# Patient Record
Sex: Female | Born: 1935 | Race: White | Hispanic: No | Marital: Married | Smoking: Former smoker
Health system: Southern US, Community
[De-identification: ages and names within clinical notes are randomized; demographics above are authoritative.]

## PROBLEM LIST (undated history)

## (undated) DIAGNOSIS — I10 Essential (primary) hypertension: Secondary | ICD-10-CM

## (undated) DIAGNOSIS — I509 Heart failure, unspecified: Secondary | ICD-10-CM

## (undated) DIAGNOSIS — J449 Chronic obstructive pulmonary disease, unspecified: Secondary | ICD-10-CM

## (undated) DIAGNOSIS — E119 Type 2 diabetes mellitus without complications: Secondary | ICD-10-CM

---

## 1998-12-03 ENCOUNTER — Ambulatory Visit (HOSPITAL_COMMUNITY): Admission: RE | Admit: 1998-12-03 | Discharge: 1998-12-03 | Payer: Self-pay | Admitting: Internal Medicine

## 1998-12-03 ENCOUNTER — Encounter: Payer: Self-pay | Admitting: Internal Medicine

## 1999-06-08 ENCOUNTER — Other Ambulatory Visit: Admission: RE | Admit: 1999-06-08 | Discharge: 1999-06-08 | Payer: Self-pay | Admitting: Internal Medicine

## 2001-06-12 ENCOUNTER — Other Ambulatory Visit: Admission: RE | Admit: 2001-06-12 | Discharge: 2001-06-12 | Payer: Self-pay | Admitting: Internal Medicine

## 2001-10-01 ENCOUNTER — Encounter: Payer: Self-pay | Admitting: *Deleted

## 2001-10-01 ENCOUNTER — Encounter: Admission: RE | Admit: 2001-10-01 | Discharge: 2001-10-01 | Payer: Self-pay | Admitting: *Deleted

## 2001-10-23 ENCOUNTER — Encounter: Payer: Self-pay | Admitting: Internal Medicine

## 2001-10-23 ENCOUNTER — Encounter: Admission: RE | Admit: 2001-10-23 | Discharge: 2001-10-23 | Payer: Self-pay | Admitting: Internal Medicine

## 2002-01-08 ENCOUNTER — Encounter: Payer: Self-pay | Admitting: Internal Medicine

## 2002-01-08 ENCOUNTER — Encounter: Admission: RE | Admit: 2002-01-08 | Discharge: 2002-01-08 | Payer: Self-pay | Admitting: Internal Medicine

## 2004-07-19 ENCOUNTER — Inpatient Hospital Stay (HOSPITAL_COMMUNITY): Admission: RE | Admit: 2004-07-19 | Discharge: 2004-07-22 | Payer: Self-pay | Admitting: Orthopedic Surgery

## 2004-12-06 ENCOUNTER — Inpatient Hospital Stay (HOSPITAL_COMMUNITY): Admission: RE | Admit: 2004-12-06 | Discharge: 2004-12-09 | Payer: Self-pay | Admitting: Orthopedic Surgery

## 2005-04-18 ENCOUNTER — Inpatient Hospital Stay (HOSPITAL_COMMUNITY): Admission: RE | Admit: 2005-04-18 | Discharge: 2005-04-21 | Payer: Self-pay | Admitting: Orthopedic Surgery

## 2005-11-01 ENCOUNTER — Encounter: Admission: RE | Admit: 2005-11-01 | Discharge: 2005-11-01 | Payer: Self-pay | Admitting: Internal Medicine

## 2009-02-05 ENCOUNTER — Ambulatory Visit: Admission: RE | Admit: 2009-02-05 | Discharge: 2009-02-05 | Payer: Self-pay | Admitting: Family Medicine

## 2011-01-07 NOTE — Discharge Summary (Signed)
[name], [name]                  ACCOUNT NO.:  [id number]   MEDICAL RECORD NO.:  [id number]          PATIENT TYPE:  INP   LOCATION:  1509                         FACILITY:  [facility]   PHYSICIAN:  Ollen Gross, M.D.    DATE OF BIRTH:  02/29/1926   DATE OF ADMISSION:  04/18/2005  DATE OF DISCHARGE:  04/21/2005                                 DISCHARGE SUMMARY   ADMITTING DIAGNOSES:  1.  Osteoarthritis, right knee.  2.  History of bronchitis.  3.  Hypertension.  4.  Mild stress urinary incontinence.  5.  Lumbar degenerative disk disease.   DISCHARGE DIAGNOSES:  1.  Osteoarthritis, right knee, status post right total knee arthroplasty.  2.  History of bronchitis.  3.  Hypertension.  4.  Mild stress urinary incontinence.  5.  Lumbar degenerative disk disease.   PROCEDURE:  The right total knee done on April 18, 2005, by Dr. Lequita Halt.  Assistant:  Avel Peace, PA-C.  Spinal anesthesia.  Target time:  46  minutes.   BRIEF HISTORY:  [name] is a 77 year old female with severe end-stage  arthritis of the right knee with intractable pain that failed nonoperative  management who now presents for total knee arthroplasty.   LABORATORY DATA:  Pre-op CBC:  Hemoglobin 13.6, hematocrit 40.0, normal  differential with the exception of elevated eos of 8.  Post-op hemoglobin  10.5.  Last H&H 10.0 and 28.3.  PT/PTT pre-op 13.2 and 42, respectively,  with INR 1.0.  Serial pro times were followed with the last PT/INR of 19.5  and 1.6.  Chem panel on admission all within normal limits.  Serial BMETs  were followed.  Glucose went up from 102 to 194 back down to 133.  Pre-op UA  negative.  Blood group type O+.   HOSPITAL COURSE:  The patient was admitted to [facility] and  tolerated the procedure well.  Later transferred to recovery then the  orthopedic floor.  Started on PCA and oral analgesics for pain control  following surgery.  By day 1, the patient was doing pretty well.  Did  have  some pain.  Hemovac drain was out.  Serum glucose was up likely due to the  dextrose in the fluids.  Fluids were reduced.  The patient was on a fluid  pill and already had been diuresing fluids real well so that she was not  overloaded by the morning of day 1.  She got up with physical therapy on day  1 and actually ambulated 60 feet that afternoon.  By day 2, she was doing  very well.  Serum glucose had already improved after removing the dextrose.  Dressing was changed on day 2.  Incision looked good.  Foley was  discontinued.  Lines/IVs were discontinued.  Discharge planning consulted.  The patient did well with physical therapy on day 2, did well enough she was  ambulating approximately 90-100 feet and did so well was ready to go home by  day 3.   DISCHARGE PLAN:  1.  The patient is discharged home on April 21, 2005.  2.  Discharge diagnoses:  Please see above.  3.  Discharge meds:  Percocet, Robaxin and Coumadin.  4.  Diet:  Resume previous home diet.  5.  Activity:  Weightbearing as tolerated.  Home health  PT.  Home RN and      total knee protocol.  May start showering.  6.  Follow up two weeks from surgery.  Call the office for an appointment at      [phone].   DISPOSITION:  Home.   CONDITION ON DISCHARGE:  Improved.      [name], P.A.      Ollen Gross, M.D.  Electronically Signed    ALP/MEDQ  D:  06/14/2005  T:  06/15/2005  Job:  [id number]   cc:   Darius Bump, M.D.  Fax: [phone]   Francisca December, M.D.  Fax: [phone]

## 2011-01-07 NOTE — Op Note (Signed)
[name], Powers                  ACCOUNT NO.:  [id number]   MEDICAL RECORD NO.:  [id number]          PATIENT TYPE:  INP   LOCATION:  0003                         FACILITY:  [facility]   PHYSICIAN:  Ollen Gross, M.D.    DATE OF BIRTH:  04-03-1926   DATE OF PROCEDURE:  12/06/2004  DATE OF DISCHARGE:                                 OPERATIVE REPORT   PREOPERATIVE DIAGNOSES:  Osteoarthritis right hip.   POSTOPERATIVE DIAGNOSES:  Osteoarthritis right hip.   PROCEDURE:  Right total hip arthroplasty.   SURGEON:  Ollen Gross, M.D.   ASSISTANT:  Alexzandrew L. Julien Girt, P.A.   ANESTHESIA:  Spinal.   ESTIMATED BLOOD LOSS:  400.   DRAINS:  Hemovac x1.   COMPLICATIONS:  None.   CONDITION:  Stable to recovery.   BRIEF CLINICAL NOTE:  [name] is a 77 year old female who has end-stage  arthritis of the right hip with intractable pain.  She has failed  nonoperative management and presents now for total hip arthroplasty.   DESCRIPTION OF PROCEDURE:  After successful administration of spinal  anesthetic, the patient was placed in the left lateral decubitus position  with the right side up and held with a hip positioner. The right lower  extremity was isolated from her perineum with plastic drapes and prepped and  draped in the usual sterile fashion. A standard posterolateral incision was  made with a 10 blade through the subcutaneous tissue to the level of the  fascia lata which was incised in line with the skin incision. The sciatic  nerve was palpated and protected and short external rotators isolated off  the femur. Capsulectomy is performed and the hip is dislocated. The center  of the femoral head is marked and a trial prosthesis placed such that the  center of the trial head corresponds to the center of the native femoral  head. Osteotomy line is marked on the femoral neck and osteotomy made with  an oscillating saw. The femoral head is removed. The femur is retracted  anteriorly to gain acetabular exposure.   The acetabular reaming starts at 45 coursing in increments of 2 to 51 mm and  then a 52 mm pinnacle acetabular shell is placed in anatomic position and  transfixed with two dome screws. A trial 28 mm neutral liner is placed.   The femur is repaired first with the canal finder and then irrigation. Axial  reaming is performed with to 13.5 mm, proximal reaming to a D and the sleeve  machined to a large. An 18D large trial sleeve is placed with an 18 x 13  stem and a 36 plus 8 neck. We matched her native anteversion. A 28 plus 0  head is placed. With the 28 plus 0, it was just an easy reduction so we went  to a 28 plus 4 neutral liner and this had much more appropriate soft tissue  tension. She had full extension, full external rotation, 70 degrees of  flexion, 40 degrees adduction, 90 degrees internal rotation and 90 degrees  flexion, 70 degrees internal rotation. The hip is  then dislocated and all  trials are removed. The permanent apex hole eliminator is placed into the  acetabular shell and then the permanent 28 mm neutral plus 4 marathon liner  is placed. The 18D large sleeve is then placed into the femur with an 18 x  13 stem and a 36 plus 8 neck matching her native anteversion. A 28 plus 0  head is placed and the hip is reduced with the same stability parameters.  The wound is copiously irrigated with saline solution and the short external  rotators reattached to the femur through drill holes. The fascia lata was  closed over a hemovac drain with interrupted #1 Vicryl, subcu closed in  multiple layers with #1 and 2-0 Vicryl and then subcuticular closed with a  running 4-0 Monocryl. The incision is cleaned and dried and Steri-Strips and  a bulky sterile dressing applied. Drains hooked to suction. She is then  placed into a knee immobilizer, awakened and transported to recovery in  stable condition.      FA/MEDQ  D:  12/06/2004  T:   12/06/2004  Job:  [id number]

## 2011-01-07 NOTE — H&P (Signed)
NAMEBarabara, [name]                  ACCOUNT NO.:  [id number]   MEDICAL RECORD NO.:  [id number]          PATIENT TYPE:  INP   LOCATION:  NA                           FACILITY:  [facility]   PHYSICIAN:  Ollen Gross, M.D.    DATE OF BIRTH:  [birth year]   DATE OF ADMISSION:  07/19/2004  DATE OF DISCHARGE:                                HISTORY & PHYSICAL   DATE OF OFFICE VISIT AND HISTORY AND PHYSICAL:  July 13, 2004   CHIEF COMPLAINT:  Bilateral knee pain, left greater than right.   HISTORY OF PRESENT ILLNESS:  A 77 year old female, seen by Dr. Lequita Halt for  ongoing bilateral knee pain.  She is known to have severe end-stage  arthritis.  She has been treated conservatively in the past with medications  and also injections including Synvisc.  She has been refractory to  conservative measurements including injections.  She is seen in the office.  Her x-rays show end-stage arthritis of both knees.  The left knee is  slightly more advanced than the right.  It is felt she would benefit from  undergoing knee replacement.  Risks and benefits have been discussed with  the patient, and she elects to proceed with surgery.   ALLERGIES:  1.  TETRACYCLINE causes hives.  2.  ACE INHIBITORS.   INTOLERANCES:  DEMEROL causes nausea.   CURRENT MEDICATIONS:  1.  Diovan 320 mg.  2.  Diltiazem 320 mg.  3.  Atenolol 25 mg  4.  Hydrochlorothiazide 50 mg.   PAST MEDICAL HISTORY:  1.  Past history of bronchitis.  2.  Hypertension.  3.  Mild stress urinary incontinence  4.  Degenerative disk disease.   PAST SURGICAL HISTORY:  1.  Recent cardiac catheterization during cardiac work-up 2 weeks ago.  2.  Tonsillectomy in 1937.  3.  Partial hysterectomy secondary to fibroids in 1969.  4.  Breast biopsy which was negative in 1987.  5.  Bilateral oophorectomy in 1991.  6.  Cholecystectomy in 1992.  7.  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   SOCIAL HISTORY:  Married, retired Runner, broadcasting/film/video,  nonsmoker, no alcohol.  Has three  children.  Husband will be assisting with care after surgery.   FAMILY HISTORY:  Father deceased age 57 with a history of stroke and  hypertension.  Mother deceased age 48 with a history of ovarian cancer.  She  also had a brother who had leukemia and is deceased.   REVIEW OF SYSTEMS:  GENERAL:  No fevers, chills, or night sweats.  NEUROLOGIC:  No seizures, syncope, paralysis.  RESPIRATORY:  She did have a  recent upper respiratory infection with cough.  No fevers, however.  This  has resolved.  CARDIOVASCULAR:  No chest pain, angina, or orthopnea.  GI:  No nausea, vomiting, diarrhea, or constipation.  GU:  No dysuria, hematuria,  or discharge.  She only has a little bit of mild stress urinary  incontinence.  MUSCULOSKELETAL:  Pertinent to the knees found in the history  of present illness.   PHYSICAL EXAMINATION:  VITAL SIGNS:  Pulse  52, respirations 12, blood  pressure 138/82.  GENERAL:  A 77 year old white female, well-nourished, well-developed, in no  acute distress.  She is alert, oriented, and cooperative, very pleasant  HEENT:  Normocephalic, atraumatic.  Pupils are round and reactive.  Oropharynx clear.  EOMs are intact.  She is noted to wear glasses.  NECK:  Supple.  CHEST:  Clear anterior and posterior chest walls.  No rhonchi, rales, or  wheezing.  HEART:  Does have a bradycardic rhythm with a pulse of 52, regular rhythm.  S1, S2 noted.  No rubs, thrills, palpitations are appreciated.  ABDOMEN:  Soft, slightly round.  Bowel sounds are present.  RECTAL/BREASTS/GENITALIA:  Not done.  Not pertinent to present illness.  EXTREMITIES:  Left knee shows range of motion of 5-110 degrees.  Tender to  palpation.  No effusion, no instability.  Right knee shows same range of  motion of 5-110 degrees, only slightly tender.  No effusion, no instability.   IMPRESSION:  1.  Osteoarthritis, bilateral knees, left more symptomatic than right.  2.  History  of bronchitis.  3.  Hypertension.  4.  Mild stress urinary incontinence.  5.  Degenerative disk disease.   PLAN:  The patient will be admitted to [facility] to undergo a  left total knee arthroplasty.  Surgery will be performed by Dr. Ollen Gross.  The patient's medical doctor is Dr. Madison Hickman.  Her  cardiologist is Dr. Amil Amen.  They will be notified of the room number on  admission and be consulted if needed for any medical assistance with the  patient throughout the hospital course.     [name]   ALP/MEDQ  D:  07/18/2004  T:  07/18/2004  Job:  [id number]   cc:   Darius Bump, M.D.  [address]  [place] [address]  Fax: [phone]   Francisca December, M.D.  [address]  [address]  [place]  [place] [address]  Fax: [phone]

## 2011-01-07 NOTE — H&P (Signed)
[name], [name]                  ACCOUNT NO.:  [id number]   MEDICAL RECORD NO.:  [id number]          PATIENT TYPE:  INP   LOCATION:  0003                         FACILITY:  [facility]   PHYSICIAN:  Ollen Gross, M.D.    DATE OF BIRTH:  June 28, 1926   DATE OF ADMISSION:  04/18/2005  DATE OF DISCHARGE:                                HISTORY & PHYSICAL   DATE OF OFFICE VISIT HISTORY AND PHYSICAL:  April 14, 2005   DATE OF ADMISSION:  April 18, 2005   CHIEF COMPLAINT:  Right knee pain.   HISTORY OF PRESENT ILLNESS:  The patient is a 77 year old female well known  to Dr. Ollen Gross, had previously undergone a right total knee  arthroplasty. She is doing very well with regards to her right hip. However,  she has continued right knee pain. She is known to have end-stage arthritis  of the right knee. She elected to undergo the right hip previously. She has  recovered well from that. She has had continued pain with the right knee  where she is found to have end-stage arthritis. It is felt she would benefit  from undergoing a total knee arthroplasty. Due to the fact that she has done  extremely well with the right hip it is felt she would be a good candidate  for right total knee. She has been seen preoperatively by Dr. Madison Hickman  and states that she is stable and does not feel that she requires any  further medical workup. Therefore, she is admitted to the hospital for  upcoming surgery.   ALLERGIES:  1.  TETRACYCLINE causes hives.  2.  DEMEROL causes nausea.  3.  ACE INHIBITORS.   CURRENT MEDICATIONS:  1.  Diltiazem 360 mg.  2.  Diovan 320 mg.  3.  Hydrochlorothiazide 50 mg.  4.  Toprol-XL 25 mg.  5.  Glucosamine/chondroitin.  6.  Centrum Silver multivitamin.  7.  Calcium and vitamin D.   PAST MEDICAL HISTORY:  1.  Hypertension.  2.  Mild urinary stress incontinence.  3.  Osteoarthritis.  4.  Lumbar degenerative disc disease.  5.  History of bronchitis.   PAST  SURGICAL HISTORY:  1.  Right total knee arthroplasty April 2006.  2.  Left total knee arthroplasty 2005.  3.  Recent cardiac catheterization.  4.  Tonsillectomy in 1937.  5.  Partial hysterectomy secondary to fibroids in 1969.  6.  Breast biopsy negative in 1987.  7.  Bilateral oophorectomy in 1991.  8.  Cholecystectomy in 1992.  9.  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 Husband and daughter will be assisting with care after surgery.   FAMILY HISTORY:  Father deceased age 34 with a history of stroke and  hypertension. Mother deceased age 45 with history of ovarian cancer. Has a  brother who had leukemia.   REVIEW OF SYSTEMS:  GENERAL:  No fevers, chills, night sweats. NEUROLOGIC:  No seizure, syncope, paralysis. RESPIRATORY:  No shortness of breath,  productive  cough, or hemoptysis. CARDIOVASCULAR:  No chest pain, angina,  orthopnea. GI:  No nausea, vomiting, diarrhea, constipation. GU:  No  dysuria, hematuria, discharge. MUSCULOSKELETAL:  Right knee found in the  history of present illness.   PHYSICAL EXAMINATION:  VITAL SIGNS:  Pulse 60, respirations 12, blood  pressure 155/77.  GENERAL:  A 77 year old female well-nourished, well-developed, no acute  distress, slightly overweight. She is alert, oriented, cooperative, pleasant  at the time of exam.  HEENT:  Normocephalic, atraumatic. Pupils round and reactive. EOMs intact.  CHEST:  Clear anterior and posterior chest walls. No rhonchi, rales, or  wheezing.  HEART:  Regular rhythm with a grade 2/6 systolic ejection murmur noted. No  rubs, rales, palpitations.  ABDOMEN:  Soft, round, slightly protuberant abdomen. Bowel sounds are  present.  RECTAL, BREAST, GENITALIA:  Not done, not pertinent to present illness.  EXTREMITIES:  Right knee:  She is moderately tender to palpation on the  anterior knees, especially over the  medial and lateral joint line. There is  moderate crepitus noted on passive range of motion. There is no effusion.   IMPRESSION:  1.  Osteoarthritis right knee.  2.  History of bronchitis.  3.  Hypertension.  4.  Mild stress urinary incontinence.  5.  Lumbar degenerative disc disease.   PLAN:  The patient will be admitted to [facility] and undergo  right total knee arthroplasty. Surgery will be performed by Dr. Ollen Gross.      Alexzandrew L. Julien Girt, P.A.      Ollen Gross, M.D.  Electronically Signed    ALP/MEDQ  D:  04/17/2005  T:  04/18/2005  Job:  [id number]   cc:   Darius Bump, M.D.  [address]  [place] [address]  Fax: [phone]   Francisca December, M.D.  [address]  [address]  [place]  [place] [address]  Fax: [phone]

## 2011-01-07 NOTE — Discharge Summary (Signed)
NAMEGRACEANNE, GUIN                  ACCOUNT NO.:  [id number]   MEDICAL RECORD NO.:  [id number]          PATIENT TYPE:  INP   LOCATION:  0471                         FACILITY:  [facility]   PHYSICIAN:  Ollen Gross, M.D.    DATE OF BIRTH:  04/11/1926   DATE OF ADMISSION:  12/06/2004  DATE OF DISCHARGE:  12/09/2004                                 DISCHARGE SUMMARY   ADMISSION DIAGNOSES:  1.  Osteoarthritis, right hip.  2.  History of bronchitis.  3.  Hypertension.  4.  Mild stress urinary incontinence.  5.  Degenerative disk disease.   DISCHARGE DIAGNOSES:  1.  Osteoarthritis, right hip, status post right total knee arthroplasty.  2.  History of bronchitis.  3.  Hypertension.  4.  Mild stress urinary incontinence.  5.  Degenerative disk disease.  6.  Postoperative hyponatremia, improved.   PROCEDURE:  December 06, 2004, right total hip. Surgeon Ollen Gross, M.D.  Assistant Alexzandrew L. Perkins, P.A.-C. Spinal anesthesia. Blood loss 400  cc.   BRIEF HISTORY:  Ms. Mcnicholas is a 77 year old female with end-stage arthritis  of the right hip with intractable pain. Failed nonoperative management. Now  presents for total hip.   LABORATORY DATA:  Preoperative CBC:  Hemoglobin 13.7, hematocrit 40.7,  differential within normal limits with the exception of mildly elevated  eosinophils of 7, normal white count, postoperative hemoglobin down to 10.8.  Last noted hemoglobin has stabilized at 10.8 with a hematocrit of 31.4.  PT/PTT preoperatively 12.0 and 41 respectively and INR of 0.9. Serial  protimes followed. Last known PT/INR were 14.5 and 1.2. Chem panel on  admission all within normal limits. Sodium did drop postoperatively 137 to  132, back up to 136. Glucose 119 to 198, back to 142. Remainder BMET was  within normal limits. Urinalysis:  Trace leukocyte esterase, few  epithelials, 2 white cells, otherwise negative. Blood group type O positive.  Right hip films on November 29, 2004:   Atypical presentation of degenerative  OA, right hip, with narrowing of the more central aspect of the joint.  Pelvis taken on December 06, 2004:  Satisfactory appearance of right total hip,  portable right hip films, same satisfactory appearance of right total hip on  December 06, 2004.   HOSPITAL COURSE:  Admitted to [facility]. Underwent the above  procedure without complication. Tolerated well. Was later transferred to the  floor. Placed on PCA and p.o. analgesia for pain control following surgery.  Did well through that night. By day #1, Hemovac drain placed at time of  surgery was pulled. She had a little hyperglycemia felt to be due to the D5  and the fluids. That was changed. A little bit of mild hyponatremia felt to  be dilutional. She was on fluid pill. Her home medications were restarted.  By day #2, her sodium had come back up to 136. Glucose had come back down to  142. She was doing better, had less pain, started getting up with physical  therapy, ambulating approximately 80 feet and 140 feet with therapy, was  progressing well.  IVs were discontinued. By day #3, she was feeling well,  seemed by Dr. Despina Hick, and was discharged home.   DISCHARGE AND PLAN:  1.  Discharged home on December 09, 2004.  2.  Discharge diagnoses:  Please see above.  3.  Discharge medications:Home meds plus Percocet, Robaxin, Coumadin  4.  Diet:  Resume previous home diet. Regular low sodium diet.  5.  Activity:  Partial weightbearing 50% RLE.   Followup two weeks from surgery. Call the office for an appointment, [phone]  [phone].   DISPOSITION:  To home.   CONDITION ON DISCHARGE:  Improved.      ALP/MEDQ  D:  01/21/2005  T:  01/21/2005  Job:  [id number]   cc:   Darius Bump, M.D.  [address]  [place] [address]  Fax: [phone]   Francisca December, M.D.  [address]  [address]  [place]  [place] [address]  Fax: [phone]

## 2011-01-07 NOTE — Discharge Summary (Signed)
[name], [name]                  ACCOUNT NO.:  [id number]   MEDICAL RECORD NO.:  [id number]          PATIENT TYPE:  INP   LOCATION:  0464                         FACILITY:  [facility]   PHYSICIAN:  Ollen Gross, M.D.    DATE OF BIRTH:  1926/01/18   DATE OF ADMISSION:  07/19/2004  DATE OF DISCHARGE:  07/22/2004                                 DISCHARGE SUMMARY   ADMISSION DIAGNOSES:  1.  Osteoarthritis of bilateral knees, left more symptomatic than right.  2.  History of bronchitis.  3.  Hypertension.  4.  Mild stress urinary incontinence.  5.  Degenerative disk disease.   DISCHARGE DIAGNOSES:  1.  Osteoarthritis of left knee, status post left total knee arthroplasty.  2.  Postoperative hyponatremia, improving.  3.  History of bronchitis.  4.  Hypertension.  5.  Mild stress urinary incontinence.  6.  Degenerative disk disease.   PROCEDURES:  On July 19, 2004, left total knee arthroplasty.  Surgeon:  Ollen Gross, M.D.  Assistant:  Alexzandrew L. Julien Girt, P.A.  Anesthesia:  Spinal.  Minimal blood loss.  Hemovac drain x 1.  Tourniquet time 49 minutes  at 300 mmHg.   CONSULTS:  None.   BRIEF HISTORY:  Ms. [name] is a 77 year old female with end-stage arthritis  of both knees.  The left is more symptomatic.  She has failed nonoperative  management, including injections, and now presents for total knee  arthroplasty.   LABORATORY DATA:  Preoperative CBC done on an outpatient basis showed a  hemoglobin of 14.3, a hematocrit of 41.0 and differential within normal  limits.  H&H postoperatively 11.6 and 34.0.  Last H&H 10.7 and 31.6.  PT and  PTT preoperatively 12.8 and 45, respectively.  INR 1.0.  Serial pro times  were followed.  The last known PT and INR was 14.5 and 1.2, respectively.  Chemistry panel on admission within normal limits with the exception of a  low sodium of 132 and a slightly elevated AST of 43.  Serial BMETs were  followed.  The sodium did drop down to 131,  then down again to 129 and back  up to 124.  The potassium went down from 3.6 to 3.4.  Glucose went up from  104 to 177 and back down to 131.  The urinalysis was cloudy, otherwise  negative.  Blood type O positive.   Two-view chest dated July 09, 2004, showed no evidence of acute  cardiopulmonary disease.  There is a previous EKG on the chart dated  June 29, 2004, with a heart rate of 69 in sinus rhythm, unconfirmed, no  signature.   HOSPITAL COURSE:  The patient was admitted to [facility], taken to  the OR and underwent the above-stated procedure without complication.  The  patient tolerated the procedure well and later was sent to the recovery room  and then to the orthopedic floor for continued postoperative care.  The  patient was placed on PCA and p.o. analgesic for pain control following  surgery.  The Hemovac drain placed at the time of surgery was pulled.  On  day #1, the patient did have a drop in her sodium felt to be due to fluid  dilution.  We reduced the fluid rate and resumed her home medications.  She  did get up out of bed with physical therapy to a chair.  By day #2, she was  doing a little bit better.  She had been using the CPM for assistive motion.  Her sodium dropped a little bit better further down to 129.  Fluids were  discontinued.  The dressing was changed.  The incision was healing well.  She had already been up ambulating approximately 20 feet in the morning with  therapy and then 120 feet later that day.  She is progressing well.  By day  #3, she had a little bit of elevated temperature.  On the evening of day #2,  a urinalysis was checked which was okay.  Her sodium had come back up.  She  was given some oral potassium for the low potassium.  Her temperature had  come back down once she was up ambulating.  Her fever improved.  Her sodium  had improved.  She had been placed on potassium supplement.  She was doing  well with therapy.  It was  decided to be discharged home on July 22, 2004.   DISCHARGE PLAN:  The patient was discharged home on July 22, 2004.   DISCHARGE MEDICATIONS:  Coumadin, Percocet and Robaxin.   DIET:  As tolerated.  Resume previous home diet.   ACTIVITY:  Total knee protocol.  Weightbearing as tolerated.  Home health PT  and home health nursing.   FOLLOWUP:  Follow up two weeks from surgery.   DISPOSITION:  Home.   CONDITION UPON DISCHARGE:  Improved.     [name]   ALP/MEDQ  D:  08/26/2004  T:  08/26/2004  Job:  [id number]   cc:   Darius Bump, M.D.  [place]  [place] [address]  Fax: [phone]   Francisca December, M.D.  [address]  [address]  [place]  [place] [address]  Fax: [phone]

## 2011-01-07 NOTE — Op Note (Signed)
[name], [name]                  ACCOUNT NO.:  [id number]   MEDICAL RECORD NO.:  [id number]          PATIENT TYPE:  INP   LOCATION:  0002                         FACILITY:  [facility]   PHYSICIAN:  Ollen Gross, M.D.    DATE OF BIRTH:  1926-01-30   DATE OF PROCEDURE:  07/19/2004  DATE OF DISCHARGE:                                 OPERATIVE REPORT   PREOPERATIVE DIAGNOSIS:  Osteoarthritis left knee.   POSTOPERATIVE DIAGNOSIS:  Osteoarthritis left knee.   PROCEDURE:  Left total knee arthroplasty.   SURGEON:  Ollen Gross, M.D.   ASSISTANT:  Alexzandrew L. Julien Girt, P.A.   ANESTHESIA:  Spinal.   ESTIMATED BLOOD LOSS:  Minimal.   DRAIN:  Hemovac x 1.   TOURNIQUET TIME:  49 minutes at 300 mmHg.   COMPLICATIONS:  None.   CONDITION:  Stable to recovery.   BRIEF CLINICAL NOTE:  [name] is a 77 year old female with end-stage  arthritis of both knees with intractable pain.  Left knee is more  symptomatic than the right.  She has failed nonoperative management  including injections and presents now for total knee arthroplasty.   PROCEDURE IN DETAIL:  After successful administration of spinal anesthetic,  a tourniquet was placed high on the left thigh and left lower extremity  prepped and draped in the usual sterile fashion.  Extremity was wrapped in  Esmarch, knee flexed, and tourniquet inflated to 300 mmHg.  A standard  midline incision was made with a 10 blade through subcutaneous tissue to the  level of the extensor mechanism.  A fresh blade was used to make a medial  parapatellar arthrotomy, then the soft tissue over the proximal medial tibia  subperiosteally elevated to the joint line with a knife and into the  semimembranosus bursa with a Cobb elevator.  The soft tissue over the  proximal lateral tibia was also elevated with attention being paid to avoid  the patellar tendon on tibial tubercle.  The patella was everted and the  knee flexed to 90 degrees, and ACL and PCL  removed.  She had  tricompartmental degenerative change bone-on-bone throughout.  A drill was  used to create a starting hole in the distal femur, and the canal was  irrigated.  A 5-degree left valgus alignment guide was placed and  referencing of the posterior condyle's rotation was marked and the block  pins removed 10 mm up the distal femur.  Distal femoral resection was made  with an oscillating saw.  Sizing blocks placed, and size 3 was most  appropriate.  The rotation was marked off the epicondylar axis.  The cutting  block for a size 3 was placed, and the anterior, posterior, and chamfer cuts  were made.   Tibia was subluxed forward, and the menisci removed.  Extramedullary tibial  alignment guide was placed referencing proximally at the medial aspect of  the tibial tubercle and distally along the second metatarsal axis and tibial  crest.  A block was pinned to remove 10 mm of the nondeficient lateral side.  Tibial resection was made with an oscillating  saw.  The sizing 3 was the  most appropriate tibial component, and then the proximal tibia was prepared  with the modular drill and keel punch for a size 3.  Femoral preparation was  completed with the intercondylar cut.   The size 3 posterior stabilized femoral trial, size 3 mobile bearing tibial  trial, and a 10 mm posterior stabilized rotating platform insert trial were  placed.  With the 10, full extension was achieved, with excellent varus and  valgus balance throughout full range of motion.  The patella was then  everted, and thickness measured to be 22 mm.  Freehand resection was taken  to 13 mm, 38 template was placed, lug holes were drilled, trial patella was  placed, and it tracked normally.  The osteophytes were then removed  off the  posterior femur with the trial in place.  All trials were removed, and the  cut bone surface was prepared with pulsatile lavaged.  Cement was mixed and,  once ready for implantation, the  size 3 posterior stabilized femur, size 3  mobile bearing tibial tray, and 38 patella were cemented into place.  The  patella was held with a clamp.  The 10 mm trial insert was placed, and the  knee held in full extension, and all extruded cement removed.  Once the  cement was full hardened, then the permanent 10 mm posterior stabilized  rotating platform insert was placed in the tibial tray.  The wound was  copiously irrigated with saline solution, and the extensor mechanism closed  over a Hemovac drain with interrupted #1 PDS.  Flexion against gravity to  125 degrees.  Tourniquet was then released for a total tourniquet time of 36  minutes.  The tourniquet was then released for a total tourniquet time of 49  minutes.  Subcutaneous was closed with interrupted 2-0 Vicryl, subcuticular  running 4-0 Monocryl.  The incision was cleaned and dried, and Steri-Strips  and a bulky sterile dressing applied.  She was then awakened and transported  to recovery in stable condition.     [name]   FA/MEDQ  D:  07/19/2004  T:  07/19/2004  Job:  [id number]

## 2011-01-07 NOTE — H&P (Signed)
[name], [name]                  ACCOUNT NO.:  [id number]   MEDICAL RECORD NO.:  [id number]          PATIENT TYPE:  INP   LOCATION:  0471                         FACILITY:  [facility]   PHYSICIAN:  Ollen Gross, M.D.    DATE OF BIRTH:  July 03, 1926   DATE OF ADMISSION:  12/06/2004  DATE OF DISCHARGE:                                HISTORY & PHYSICAL   CHIEF COMPLAINT:  Right hip pain.   HISTORY OF PRESENT ILLNESS:  The patient is a 77 year old female well known  to Dr. Ollen Gross having previously undergone a left total knee  replacement and has done quite well with her knee. She has known history of  right hip arthritis and the hip has become progressively worse, interfering  with her activities with what she can and cannot do. X-rays show end-stage  arthritis. It is felt that she could benefit from undergoing surgical  intervention. Risks and benefits have been discussed with the patient. She  elected to proceed with surgery.   ALLERGIES:  TETRACYCLINE causes hives and ACE INHIBITORS. Intolerance to  DEMEROL which causes nausea.   CURRENT MEDICATIONS:  1.  Diovan 320 mg.  2.  Diltiazem 360 mg.  3.  Toprol XL 25 mg.  4.  Hydrochlorothiazide 50 mg.   PAST MEDICAL HISTORY:  1.  Past history of bronchitis.  2.  Hypertension.  3.  Mild stress urinary incontinence.  4.  Degenerative disc disease.   PAST SURGICAL HISTORY:  1.  Left total knee arthroplasty in 2005.  2.  Recent cardiac catheterization.  3.  Tonsillectomy in 1937.  4.  Partial hysterectomy secondary to fibroids in 1969.  5.  Breast biopsy which was negative in 1987.  6.  Bilateral oophorectomy in 1991.  7.  Cholecystectomy in 1992.  8.  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   SOCIAL HISTORY:  Married. Retired Runner, broadcasting/film/video. Nonsmoker, no alcohol. Has three  children. Husband will be assisting with care after surgery.   FAMILY HISTORY:  Father is deceased at age 39 with a history of stroke and  hypertension. Mother is deceased at age 47 with a history of ovarian cancer.  She has a brother who had leukemia.   REVIEW OF SYMPTOMS:  GENERAL:  No fevers, chills, or night sweats.  NEUROLOGICAL:  No seizures, syncope, or paralysis. RESPIRATORY:  No  shortness of breath, productive cough, or hemoptysis. CARDIOVASCULAR:  No  chest pain, angina, or orthopnea. GI:  No nausea, vomiting, diarrhea, or  constipation. GU:  No dysuria, hematuria, or discharge. MUSCULOSKELETAL:  Right hip found in the history of present illness.   PHYSICAL EXAMINATION:  VITAL SIGNS:  Pulse 52, respirations 12, blood  pressure 140/72.  GENERAL:  A 77 year old female well-developed, well-nourished in no acute  distress. She is alert, oriented, and cooperative. Pleasant at the time of  my exam.  HEENT:  Normocephalic and atraumatic.  Pupils are equal, round, and  reactive. EOMs are intact. She does wear glasses.  NECK:  Supple.  LUNGS:  She has clear anterior and posterior chest walls. No rhonchi, rubs,  or wheezing.  HEART:  Bradycardic rhythm with a pulse of 52. Regular rhythm otherwise. S1  and S2 noted. No rubs, thrills, or palpitations.  ABDOMEN:  Soft, slightly round, bowel sounds present. No rebound or  guarding.  RECTAL:  Not done, not pertinent to present illness.  BREASTS:  Not done, not pertinent to present illness.  GENITOURINARY:  Not done, not pertinent to present illness.  EXTREMITIES:  Right lower extremity:  She does note to have some pain with  passive range of motion with hip flexion and internal and external rotation  was limited secondary to pain. She does ambulate with an antalgic gait.   IMPRESSION:  1.  Osteoarthritis right hip.  2.  History of bronchitis.  3.  Hypertension.  4.  Mild stress urinary incontinence.  5.  Degenerative disc disease.   PLAN:  The patient is admitted to [facility] to undergo right  total hip arthroplasty. Surgery will be performed by Dr. Ollen Gross.      ALP/MEDQ  D:  12/06/2004  T:  12/06/2004  Job:  [id number]   cc:   Ollen Gross, M.D.  [facility]  [address]  [address]  [place]  [place] [address]  Fax: [phone]   Darius Bump, M.D.  [phone] [address]  [place] [address]  Fax: [phone]   Francisca December, M.D.  [address]  [address]  [place]  [place] [address]  Fax: [phone]

## 2011-01-07 NOTE — Op Note (Signed)
[name], [name]                  ACCOUNT NO.:  [id number]   MEDICAL RECORD NO.:  [id number]          PATIENT TYPE:  INP   LOCATION:  0003                         FACILITY:  [facility]   PHYSICIAN:  Ollen Gross, M.D.    DATE OF BIRTH:  11-02-1925   DATE OF PROCEDURE:  04/18/2005  DATE OF DISCHARGE:                                 OPERATIVE REPORT   PREOPERATIVE DIAGNOSIS:  Osteoarthritis, right knee.   POSTOPERATIVE DIAGNOSIS:  Osteoarthritis, right knee.   PROCEDURE:  Right total knee arthroplasty.   SURGEON:  Dr. Lequita Halt   ASSISTANT:  Avel Peace, PA-C   ANESTHESIA:  Spinal.   ESTIMATED BLOOD LOSS:  Minimal.   DRAINS:  Hemovac x1.   TOURNIQUET TIME:  46 minutes at 300 mmHg.   COMPLICATIONS:  None.   CONDITION:  Stable to recovery.   BRIEF CLINICAL NOTE:  [name] is a 77 year old female with severe end-  stage arthritis of the right knee with intractable pain.  She has failed  nonoperative management and presents now for right total knee arthroplasty.   PROCEDURE IN DETAIL:  After the successful administration of spinal  anesthetic, a tourniquet is placed on the right thigh and right lower  extremity prepped and draped in the usual sterile fashion.  Extremity is  wrapped in Esmarch, knee flexed, tourniquet inflated to 300 mmHg.  Standard  midline incision is made with a 10 blade through subcutaneous tissue to the  level of the extensor mechanism.  A fresh blade is used to make a medial  parapatellar arthrotomy, and then the soft tissue over the proximal and  medial tibia is subperiosteally elevated to the joint line with a knife and  into the semimembranosus bursa with a Cobb elevator.  Soft tissue over the  proximal and lateral tibia is also elevated with attention being paid to  avoiding the patellar tendon on tibial tubercle.  Patella is everted, knee  flexed 90 degrees, and ACL and PCL removed.  Drill is used to create a  starting hole in the distal femur, and  the canal is thoroughly irrigated.  A  5-degree right valgus alignment guide is placed and referencing off the  posterior condyles, rotation is marked and the block pinned to remove 10 mm  off the distal femur.  Distal femoral resection is made with an oscillating  saw.  Sizing block is placed, and a size 3 is most appropriate.  A size 3  cutting block is placed, and then the anterior, posterior, and chamfer cuts  are made for the three.   The tibia is subluxed forward and the menisci are removed.  Extramedullary  tibial alignment guide is placed referencing proximally at the medial aspect  of the tibial tubercle and distally along the second metatarsal axis and  tibial crest.  The block is pinned to remove 10 mm off the nondeficient  lateral side.  Tibial resection is made with an oscillating saw.  Size 3 is  the most appropriate tibial component, and then the proximal tibia is  prepared with the modular drill and keel  punch for a size 3.  Femoral  preparation is completed with the intercondylar cut for the size 3.   Size 3 mobile bearing tibial trial with a size 3 posterior stabilized  femoral trial and a 10 mm posterior stabilized rotating platform insert  trial are placed.  With the 10, there is a tiny bit of laxity in flexion and  extension, thus we went to a 12.5 which allowed for full extension with  excellent stability throughout full range of motion.  The patella was then  everted, thickness measured to be 23 mm.  Freehand resection is taken to 14  mm, 38 template is placed, lug holes are drilled, trial patella is placed,  and it tracks normally.  The osteophytes are then removed off the posterior  femur with the trial in place.  All trials are removed, and the cut bone  surfaces are prepared with pulsatile lavage.  Cement is mixed and once ready  for implantation, the size 3 mobile bearing tibial tray, size 3 posterior  stabilized femur, and 38 patella are cemented into place,  and the patella is  held with the clamp.  Trial 12.5 insert is placed, knee held in full  extension, and all extruded cement removed.  Once the cement is fully  hardened, then the permanent 12.5 mm posterior stabilized rotating platform  insert is placed into the tibial tray.  Wound is copiously irrigated with  saline solution and the extensor mechanism closed over a Hemovac drain with  interrupted #1 PDS.  Flexion against gravity is approximately 125 degrees.  Tourniquet is released for a total time of 46 minutes.  Minor bleeding  stopped with cautery.  Subcu is closed with interrupted 2-0 Vicryl and  subcuticular running 4-0 Monocryl.  The incision is cleaned and dried and  Steri-Strips applied.  Drain is hooked to suction and a bulky sterile  dressing applied.  She is then placed into a knee immobilizer, awakened, and  transported to recovery in stable condition.      Ollen Gross, M.D.  Electronically Signed     FA/MEDQ  D:  04/18/2005  T:  04/18/2005  Job:  [id number]

## 2018-10-27 ENCOUNTER — Encounter (HOSPITAL_COMMUNITY): Payer: Self-pay

## 2018-10-27 ENCOUNTER — Other Ambulatory Visit: Payer: Self-pay

## 2018-10-27 ENCOUNTER — Emergency Department (HOSPITAL_COMMUNITY): Payer: Medicare Other

## 2018-10-27 ENCOUNTER — Inpatient Hospital Stay (HOSPITAL_COMMUNITY)
Admission: EM | Admit: 2018-10-27 | Discharge: 2018-11-01 | DRG: 190 | Disposition: A | Discharge status: Home / Self Care | Payer: Medicare Other | Attending: Internal Medicine | Admitting: Internal Medicine

## 2018-10-27 DIAGNOSIS — Z881 Allergy status to other antibiotic agents status: Secondary | ICD-10-CM

## 2018-10-27 DIAGNOSIS — I110 Hypertensive heart disease with heart failure: Secondary | ICD-10-CM | POA: Diagnosis present

## 2018-10-27 DIAGNOSIS — I272 Pulmonary hypertension, unspecified: Secondary | ICD-10-CM | POA: Diagnosis present

## 2018-10-27 DIAGNOSIS — J84112 Idiopathic pulmonary fibrosis: Secondary | ICD-10-CM | POA: Diagnosis present

## 2018-10-27 DIAGNOSIS — J9601 Acute respiratory failure with hypoxia: Secondary | ICD-10-CM | POA: Diagnosis present

## 2018-10-27 DIAGNOSIS — I5030 Unspecified diastolic (congestive) heart failure: Secondary | ICD-10-CM | POA: Diagnosis not present

## 2018-10-27 DIAGNOSIS — R0902 Hypoxemia: Secondary | ICD-10-CM

## 2018-10-27 DIAGNOSIS — J441 Chronic obstructive pulmonary disease with (acute) exacerbation: Principal | ICD-10-CM | POA: Diagnosis present

## 2018-10-27 DIAGNOSIS — R0602 Shortness of breath: Secondary | ICD-10-CM

## 2018-10-27 DIAGNOSIS — I10 Essential (primary) hypertension: Secondary | ICD-10-CM | POA: Diagnosis present

## 2018-10-27 DIAGNOSIS — I5033 Acute on chronic diastolic (congestive) heart failure: Secondary | ICD-10-CM | POA: Diagnosis present

## 2018-10-27 DIAGNOSIS — Z888 Allergy status to other drugs, medicaments and biological substances status: Secondary | ICD-10-CM

## 2018-10-27 DIAGNOSIS — E876 Hypokalemia: Secondary | ICD-10-CM | POA: Diagnosis present

## 2018-10-27 DIAGNOSIS — J9600 Acute respiratory failure, unspecified whether with hypoxia or hypercapnia: Secondary | ICD-10-CM | POA: Diagnosis present

## 2018-10-27 DIAGNOSIS — Z66 Do not resuscitate: Secondary | ICD-10-CM | POA: Diagnosis present

## 2018-10-27 DIAGNOSIS — Z79899 Other long term (current) drug therapy: Secondary | ICD-10-CM | POA: Diagnosis not present

## 2018-10-27 DIAGNOSIS — Z885 Allergy status to narcotic agent status: Secondary | ICD-10-CM

## 2018-10-27 HISTORY — DX: Chronic obstructive pulmonary disease, unspecified: J44.9

## 2018-10-27 HISTORY — DX: Essential (primary) hypertension: I10

## 2018-10-27 LAB — CBC WITH DIFFERENTIAL/PLATELET
Abs Immature Granulocytes: 0.03 10*3/uL (ref 0.00–0.07)
BASOS ABS: 0 10*3/uL (ref 0.0–0.1)
BASOS PCT: 1 %
EOS ABS: 0 10*3/uL (ref 0.0–0.5)
EOS PCT: 1 %
HCT: 46.9 % — ABNORMAL HIGH (ref 36.0–46.0)
Hemoglobin: 16 g/dL — ABNORMAL HIGH (ref 12.0–15.0)
Immature Granulocytes: 0 %
LYMPHS PCT: 12 %
Lymphs Abs: 0.8 10*3/uL (ref 0.7–4.0)
MCH: 31.9 pg (ref 26.0–34.0)
MCHC: 34.1 g/dL (ref 30.0–36.0)
MCV: 93.6 fL (ref 80.0–100.0)
Monocytes Absolute: 0.6 10*3/uL (ref 0.1–1.0)
Monocytes Relative: 9 %
NRBC: 0 % (ref 0.0–0.2)
Neutro Abs: 5.3 10*3/uL (ref 1.7–7.7)
Neutrophils Relative %: 77 %
PLATELETS: 177 10*3/uL (ref 150–400)
RBC: 5.01 MIL/uL (ref 3.87–5.11)
RDW: 13.2 % (ref 11.5–15.5)
WBC: 6.8 10*3/uL (ref 4.0–10.5)

## 2018-10-27 LAB — BASIC METABOLIC PANEL
Anion gap: 16 — ABNORMAL HIGH (ref 5–15)
BUN: 16 mg/dL (ref 8–23)
CO2: 22 mmol/L (ref 22–32)
Calcium: 9.5 mg/dL (ref 8.9–10.3)
Chloride: 96 mmol/L — ABNORMAL LOW (ref 98–111)
Creatinine, Ser: 0.85 mg/dL (ref 0.44–1.00)
GFR calc Af Amer: >60 mL/min (ref >60)
GLUCOSE: 159 mg/dL — AB (ref 70–99)
Potassium: 3.1 mmol/L — ABNORMAL LOW (ref 3.5–5.1)
Sodium: 134 mmol/L — ABNORMAL LOW (ref 135–145)

## 2018-10-27 LAB — CBC
HCT: 44.7 % (ref 36.0–46.0)
Hemoglobin: 15.6 g/dL — ABNORMAL HIGH (ref 12.0–15.0)
MCH: 32.1 pg (ref 26.0–34.0)
MCHC: 34.9 g/dL (ref 30.0–36.0)
MCV: 92 fL (ref 80.0–100.0)
Platelets: 184 10*3/uL (ref 150–400)
RBC: 4.86 MIL/uL (ref 3.87–5.11)
RDW: 13.1 % (ref 11.5–15.5)
WBC: 8.2 10*3/uL (ref 4.0–10.5)
nRBC: 0 % (ref 0.0–0.2)

## 2018-10-27 LAB — TROPONIN I: Troponin I: <0.03 ng/mL (ref <0.03)

## 2018-10-27 LAB — CREATININE, SERUM
CREATININE: 0.82 mg/dL (ref 0.44–1.00)
GFR calc Af Amer: >60 mL/min (ref >60)
GFR calc non Af Amer: >60 mL/min (ref >60)

## 2018-10-27 LAB — MAGNESIUM: Magnesium: 1.6 mg/dL — ABNORMAL LOW (ref 1.7–2.4)

## 2018-10-27 LAB — BRAIN NATRIURETIC PEPTIDE: B NATRIURETIC PEPTIDE 5: 211.9 pg/mL — AB (ref 0.0–100.0)

## 2018-10-27 MED ORDER — IRBESARTAN 300 MG PO TABS
300.0000 mg | ORAL_TABLET | Freq: Every day | ORAL | Status: DC
  Administered 2018-10-27: 300 mg via ORAL
  Filled 2018-10-27 (×2): qty 1

## 2018-10-27 MED ORDER — TRAZODONE HCL 50 MG PO TABS: 50.0000 mg | ORAL_TABLET | Freq: Every evening | ORAL | Status: DC | PRN

## 2018-10-27 MED ORDER — POTASSIUM CHLORIDE CRYS ER 20 MEQ PO TBCR
20.0000 meq | EXTENDED_RELEASE_TABLET | Freq: Two times a day (BID) | ORAL | Status: AC
  Administered 2018-10-27 – 2018-10-28 (×2): 20 meq via ORAL
  Filled 2018-10-27 (×2): qty 1

## 2018-10-27 MED ORDER — BENZONATATE 100 MG PO CAPS
200.0000 mg | ORAL_CAPSULE | Freq: Three times a day (TID) | ORAL | Status: DC | PRN
  Administered 2018-10-27 – 2018-10-30 (×3): 200 mg via ORAL
  Filled 2018-10-27 (×3): qty 2

## 2018-10-27 MED ORDER — DILTIAZEM HCL ER COATED BEADS 360 MG PO TB24
360.0000 mg | ORAL_TABLET | Freq: Every day | ORAL | Status: DC
  Administered 2018-10-27: 360 mg via ORAL
  Filled 2018-10-27 (×2): qty 1

## 2018-10-27 MED ORDER — IPRATROPIUM-ALBUTEROL 0.5-2.5 (3) MG/3ML IN SOLN
3.0000 mL | Freq: Once | RESPIRATORY_TRACT | Status: AC
  Administered 2018-10-27: 3 mL via RESPIRATORY_TRACT
  Filled 2018-10-27: qty 3

## 2018-10-27 MED ORDER — ACETAMINOPHEN 325 MG PO TABS: 650.0000 mg | ORAL_TABLET | Freq: Four times a day (QID) | ORAL | Status: DC | PRN

## 2018-10-27 MED ORDER — MAGNESIUM GLUCONATE 500 MG PO TABS
500.0000 mg | ORAL_TABLET | Freq: Once | ORAL | Status: AC
  Administered 2018-10-27: 500 mg via ORAL
  Filled 2018-10-27: qty 1

## 2018-10-27 MED ORDER — ALBUTEROL SULFATE (2.5 MG/3ML) 0.083% IN NEBU: 2.5000 mg | INHALATION_SOLUTION | RESPIRATORY_TRACT | Status: DC | PRN

## 2018-10-27 MED ORDER — SODIUM CHLORIDE 0.9 % IV SOLN
1.0000 g | INTRAVENOUS | Status: DC
  Administered 2018-10-28 – 2018-10-30 (×4): 1 g via INTRAVENOUS
  Filled 2018-10-27 (×5): qty 10

## 2018-10-27 MED ORDER — SENNOSIDES-DOCUSATE SODIUM 8.6-50 MG PO TABS: 1.0000 | ORAL_TABLET | Freq: Every evening | ORAL | Status: DC | PRN

## 2018-10-27 MED ORDER — METOPROLOL TARTRATE 25 MG PO TABS
12.5000 mg | ORAL_TABLET | Freq: Every day | ORAL | Status: DC
  Administered 2018-10-27 – 2018-11-01 (×5): 12.5 mg via ORAL
  Filled 2018-10-27 (×6): qty 1

## 2018-10-27 MED ORDER — ENOXAPARIN SODIUM 40 MG/0.4ML ~~LOC~~ SOLN
40.0000 mg | SUBCUTANEOUS | Status: DC
  Administered 2018-10-27 – 2018-10-31 (×5): 40 mg via SUBCUTANEOUS
  Filled 2018-10-27 (×5): qty 0.4

## 2018-10-27 MED ORDER — METHYLPREDNISOLONE SODIUM SUCC 125 MG IJ SOLR
60.0000 mg | Freq: Once | INTRAMUSCULAR | Status: AC
  Administered 2018-10-27: 60 mg via INTRAVENOUS
  Filled 2018-10-27: qty 2

## 2018-10-27 MED ORDER — ONDANSETRON HCL 4 MG/2ML IJ SOLN: 4.0000 mg | Freq: Four times a day (QID) | INTRAMUSCULAR | Status: DC | PRN

## 2018-10-27 MED ORDER — HYDROCHLOROTHIAZIDE 25 MG PO TABS
25.0000 mg | ORAL_TABLET | Freq: Every day | ORAL | Status: DC
  Filled 2018-10-27: qty 1

## 2018-10-27 MED ORDER — PREDNISONE 20 MG PO TABS
40.0000 mg | ORAL_TABLET | Freq: Every day | ORAL | Status: DC
  Administered 2018-10-28: 40 mg via ORAL
  Filled 2018-10-27: qty 2

## 2018-10-27 MED ORDER — IPRATROPIUM-ALBUTEROL 0.5-2.5 (3) MG/3ML IN SOLN
3.0000 mL | RESPIRATORY_TRACT | Status: DC
  Administered 2018-10-27 – 2018-10-29 (×8): 3 mL via RESPIRATORY_TRACT
  Filled 2018-10-27 (×6): qty 3

## 2018-10-27 MED ORDER — ONDANSETRON HCL 4 MG PO TABS: 4.0000 mg | ORAL_TABLET | Freq: Four times a day (QID) | ORAL | Status: DC | PRN

## 2018-10-27 MED ORDER — ACETAMINOPHEN 650 MG RE SUPP: 650.0000 mg | Freq: Four times a day (QID) | RECTAL | Status: DC | PRN

## 2018-10-27 NOTE — ED Provider Notes (Signed)
Emergency Department Provider Note   I have reviewed the triage vital signs and the nursing notes.   HISTORY  Chief Complaint Shortness of Breath   HPI [name] is a 85 y.o. female with PMH of COPD and HTN presents to the emergency department for evaluation of shortness of breath.  The patient has COPD that is managed primarily with exercise.  She has been on inhalers in the past but got no relief from these so, in conjunction with her pulmonologist, discontinued these medications.  She has had some vague, weakness 2 weeks ago which improved.  Shortness of breath symptoms began this morning while doing her typical activities at home.  She denies any chest pain or heart palpitations.  No lightheadedness.  No new medications.  She called to alert her daughter that she was not feeling well and EMS was called.  They report oxygen saturation of 78% on room air and started 2 L nasal cannula and gave 1 breathing treatment in route to the emergency department.  Patient denies any body aches, international travel, or known sick contacts.  Past Medical History:  Diagnosis Date  . COPD (chronic obstructive pulmonary disease) (HCC)   . Hypertension     Patient Active Problem List   Diagnosis Date Noted  . COPD exacerbation (HCC) 10/27/2018  . Essential hypertension 10/27/2018  . Acute respiratory failure (HCC) 10/27/2018  . Hypomagnesemia 10/27/2018  . Hypokalemia 10/27/2018   Allergies Bisoprolol-hydrochlorothiazide; Meperidine; and Tetracycline  No family history on file.  Social History Social History   Tobacco Use  . Smoking status: Not on file  Substance Use Topics  . Alcohol use: Not on file  . Drug use: Not on file    Review of Systems  Constitutional: No fever/chills Eyes: No visual changes. ENT: No sore throat. Cardiovascular: Denies chest pain. Respiratory: Positive shortness of breath. Gastrointestinal: No abdominal pain.  No nausea, no vomiting.  No  diarrhea.  No constipation. Genitourinary: Negative for dysuria. Musculoskeletal: Negative for back pain. Skin: Negative for rash. Neurological: Negative for headaches, focal weakness or numbness.  10-point ROS otherwise negative.  ____________________________________________   PHYSICAL EXAM:  VITAL SIGNS: ED Triage Vitals  Enc Vitals Group     BP 10/27/18 1259 (!) 176/78     Pulse --      Resp 10/27/18 1259 16     Temp 10/27/18 1259 97.8 F (36.6 C)     Temp Source 10/27/18 1259 Oral     SpO2 10/27/18 1253 (!) 78 %     Pain Score 10/27/18 1256 0   Constitutional: Alert and oriented. Well appearing and in no acute distress. Eyes: Conjunctivae are normal.  Head: Atraumatic. Nose: No congestion/rhinnorhea. Mouth/Throat: Mucous membranes are moist. Neck: No stridor.  Cardiovascular: Normal rate, regular rhythm. Good peripheral circulation. Grossly normal heart sounds.   Respiratory: Normal respiratory effort.  No retractions. Lungs with end-expiratory wheezing bilaterally.  Gastrointestinal: Soft and nontender. No distention.  Musculoskeletal: No lower extremity tenderness nor edema. No gross deformities of extremities. Neurologic:  Normal speech and language. No gross focal neurologic deficits are appreciated.  Skin:  Skin is warm, dry and intact. No rash noted.  ____________________________________________   LABS (all labs ordered are listed, but only abnormal results are displayed)  Labs Reviewed  BASIC METABOLIC PANEL - Abnormal; Notable for the following components:      Result Value   Sodium 134 (*)    Potassium 3.1 (*)    Chloride 96 (*)  Glucose, Bld 159 (*)    Anion gap 16 (*)    All other components within normal limits  BRAIN NATRIURETIC PEPTIDE - Abnormal; Notable for the following components:   B Natriuretic Peptide 211.9 (*)    All other components within normal limits  CBC WITH DIFFERENTIAL/PLATELET - Abnormal; Notable for the following components:    Hemoglobin 16.0 (*)    HCT 46.9 (*)    All other components within normal limits  MAGNESIUM - Abnormal; Notable for the following components:   Magnesium 1.6 (*)    All other components within normal limits  CBC - Abnormal; Notable for the following components:   Hemoglobin 15.6 (*)    All other components within normal limits  TROPONIN I  INFLUENZA PANEL BY PCR (TYPE A & B)  CREATININE, SERUM  BASIC METABOLIC PANEL  CBC  MAGNESIUM   ____________________________________________  EKG   EKG Interpretation  Date/Time:  Saturday October 27 2018 12:57:35 EST Ventricular Rate:  93 PR Interval:    QRS Duration: 91 QT Interval:  301 QTC Calculation: 303 R Axis:   96 Text Interpretation:  Sinus rhythm Ventricular bigeminy Short PR interval Right axis deviation Anteroseptal infarct, old Borderline repolarization abnormality No STEMI.  Confirmed by Alona Bene [phone]) on 10/27/2018 1:04:32 PM       ____________________________________________  RADIOLOGY  Dg Chest 2 View  Result Date: 10/27/2018 CLINICAL DATA:  Dyspnea EXAM: CHEST - 2 VIEW COMPARISON:  07/09/2004 chest radiograph. FINDINGS: Stable cardiomediastinal silhouette with mild cardiomegaly. No pneumothorax. No pleural effusion. No overt pulmonary edema. Hazy reticular opacities at both lung bases. Hyperinflated lungs. IMPRESSION: 1. Hyperinflated lungs, suggesting COPD. 2. Hazy reticular opacities at both lung bases worrisome for interstitial lung disease. Consider further evaluation with high-resolution chest CT, which may be performed on a short term outpatient basis as clinically warranted. 3. Mild cardiomegaly without overt pulmonary edema. Electronically Signed   By: Delbert Phenix M.D.   On: 10/27/2018 14:53   Ct Chest High Resolution  Result Date: 10/27/2018 CLINICAL DATA:  Dyspnea. COPD. Reticular opacities at the lung bases on chest radiograph. EXAM: CT CHEST WITHOUT CONTRAST TECHNIQUE: Multidetector CT imaging of the  chest was performed following the standard protocol without intravenous contrast. High resolution imaging of the lungs, as well as inspiratory and expiratory imaging, was performed. COMPARISON:  Chest radiograph from earlier today. FINDINGS: Limited motion degraded scan. Cardiovascular: Mild cardiomegaly. No significant pericardial effusion/thickening. Three-vessel coronary atherosclerosis. Atherosclerotic nonaneurysmal thoracic aorta. Borderline prominent main pulmonary artery (3.4 cm diameter). Mediastinum/Nodes: No discrete thyroid nodules. Unremarkable esophagus. No axillary adenopathy. Right paratracheal adenopathy up to 1.7 cm (series 5/image 53). Enlarged 1.3 cm subcarinal node (series 5/image 67). Enlarged 1.1 cm AP window node (series 5/image 49). No discrete hilar adenopathy on this noncontrast scan. Lungs/Pleura: No pneumothorax. No pleural effusion. Moderate to severe centrilobular emphysema with diffuse bronchial wall thickening. There is extensive patchy confluent subpleural reticulation and ground-glass attenuation in both lungs with associated subpleural lines and mild traction bronchiectasis and mild architectural distortion. There is a strong basilar predominance to these findings. No frank honeycombing. No significant lobular air trapping on the expiration sequence. No lung masses or significant pulmonary nodules. Upper abdomen: Cholecystectomy. Simple 3.8 cm anterior interpolar left renal cyst. Musculoskeletal: No aggressive appearing focal osseous lesions. Marked thoracic spondylosis. IMPRESSION: 1. Very limited motion degraded scan. 2. 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 Given the limitations of this scan, suggest attention on a  follow-up high-resolution chest CT study in 3 months. Findings are categorized as probable UIP per consensus guidelines: Diagnosis of Idiopathic Pulmonary Fibrosis: An Official  ATS/ERS/JRS/ALAT Clinical Practice Guideline. Am Rosezetta Schlatter Crit Care Med Vol 198, Iss 5, [phone], Apr 22 2017. 3. Three-vessel coronary atherosclerosis. 4. Mild cardiomegaly. 5. Nonspecific mild bilateral mediastinal adenopathy, potentially reactive, which can also be reassessed on short-term follow-up chest CT. Aortic Atherosclerosis (ICD10-I70.0) and Emphysema (ICD10-J43.9). Electronically Signed   By: Delbert Phenix M.D.   On: 10/27/2018 16:08    ____________________________________________   PROCEDURES  Procedure(s) performed:   Procedures  CRITICAL CARE Performed by: Maia Plan Total critical care time: 35 minutes Critical care time was exclusive of separately billable procedures and treating other patients. Critical care was necessary to treat or prevent imminent or life-threatening deterioration. Critical care was time spent personally by me on the following activities: development of treatment plan with patient and/or surrogate as well as nursing, discussions with consultants, evaluation of patient's response to treatment, examination of patient, obtaining history from patient or surrogate, ordering and performing treatments and interventions, ordering and review of laboratory studies, ordering and review of radiographic studies, pulse oximetry and re-evaluation of patient's condition.  Alona Bene, MD Emergency Medicine  ____________________________________________   INITIAL IMPRESSION / ASSESSMENT AND PLAN / ED COURSE  Pertinent labs & imaging results that were available during my care of the patient were reviewed by me and considered in my medical decision making (see chart for details).  Patient presents to the emergency department with shortness of breath starting this morning.  She has elevated blood pressure with EMS and 78% O2 on room air.  Patient comfortable on 2 L nasal cannula.  She has bilateral wheezing on exam.  Plan for additional nebulizer, chest x-ray, labs.   Lower suspicion for PE without chest pain.  Doubt hypertensive emergency as blood pressure on arrival to the emergency department is elevated but not to a severe level.  No chest pain.   CXR with basilar process. Doubt PNA. Improved with nebs. Continues on 3L [place]. Labs unremarkable.   Discussed patient's case with Hospitalist to request admission. Patient and family (if present) updated with plan. Care transferred to Hospitalist service.  I reviewed all nursing notes, vitals, pertinent old records, EKGs, labs, imaging (as available).  ____________________________________________  FINAL CLINICAL IMPRESSION(S) / ED DIAGNOSES  Final diagnoses:  COPD exacerbation (HCC)  Hypoxia     MEDICATIONS GIVEN DURING THIS VISIT:  Medications  enoxaparin (LOVENOX) injection 40 mg (has no administration in time range)  acetaminophen (TYLENOL) tablet 650 mg (has no administration in time range)    Or  acetaminophen (TYLENOL) suppository 650 mg (has no administration in time range)  traZODone (DESYREL) tablet 50 mg (has no administration in time range)  senna-docusate (Senokot-S) tablet 1 tablet (has no administration in time range)  ondansetron (ZOFRAN) tablet 4 mg (has no administration in time range)    Or  ondansetron (ZOFRAN) injection 4 mg (has no administration in time range)  cefTRIAXone (ROCEPHIN) 1 g in sodium chloride 0.9 % 100 mL IVPB (has no administration in time range)  predniSONE (DELTASONE) tablet 40 mg (has no administration in time range)  ipratropium-albuterol (DUONEB) 0.5-2.5 (3) MG/3ML nebulizer solution 3 mL (3 mLs Nebulization Not Given 10/27/18 1809)  albuterol (PROVENTIL) (2.5 MG/3ML) 0.083% nebulizer solution 2.5 mg (has no administration in time range)  diltiazem (CARDIZEM LA) 24 hr tablet 360 mg (has no administration in time range)  hydrochlorothiazide (HYDRODIURIL) tablet  25 mg (25 mg Oral Given 10/27/18 1911)  metoprolol tartrate (LOPRESSOR) tablet 12.5 mg (12.5 mg Oral  Given 10/27/18 1912)  irbesartan (AVAPRO) tablet 300 mg (300 mg Oral Given 10/27/18 1910)  potassium chloride SA (K-DUR,KLOR-CON) CR tablet 20 mEq (has no administration in time range)  ipratropium-albuterol (DUONEB) 0.5-2.5 (3) MG/3ML nebulizer solution 3 mL (3 mLs Nebulization Given 10/27/18 1342)  methylPREDNISolone sodium succinate (SOLU-MEDROL) 125 mg/2 mL injection 60 mg (60 mg Intravenous Given 10/27/18 1857)  magnesium gluconate (MAGONATE) tablet 500 mg (500 mg Oral Given 10/27/18 1910)    Note:  This document was prepared using Dragon voice recognition software and may include unintentional dictation errors.  Alona Bene, MD Emergency Medicine    Kelton Bultman, Arlyss Repress, MD 10/27/18 [phone]

## 2018-10-27 NOTE — ED Notes (Signed)
ED TO INPATIENT HANDOFF REPORT  ED Nurse Name and Phone #: Purnell Shoemaker [phone]  S Name/Age/Gender [name] 85 y.o. female Room/Bed: 047C/047C  Code Status   Code Status: DNR  Home/SNF/Other Home Patient oriented to: self, place, time and situation Is this baseline? Yes   Triage Complete: Triage complete  Chief Complaint sob  Triage Note No notes on file   Allergies Allergies  Allergen Reactions  . Bisoprolol-Hydrochlorothiazide Other (See Comments)    Other reaction(s): Cough (ALLERGY/intolerance)  . Meperidine Nausea And Vomiting  . Tetracycline Hives    Level of Care/Admitting Diagnosis ED Disposition    ED Disposition Condition Comment   Admit  Hospital Area: [facility] [[id number]]  Level of Care: Med-Surg [16]  Diagnosis: COPD exacerbation [facility]) [[id number]]  Admitting Physician: Sharlene Dory [phone]  Attending Physician: Sharlene Dory [[id number]]  Estimated length of stay: past midnight tomorrow  Certification:: I certify this patient will need inpatient services for at least 2 midnights  PT Class (Do Not Modify): Inpatient [101]  PT Acc Code (Do Not Modify): Private [1]       B Medical/Surgery History Past Medical History:  Diagnosis Date  . COPD (chronic obstructive pulmonary disease) (HCC)   . Hypertension       A IV Location/Drains/Wounds Patient Lines/Drains/Airways Status   Active Line/Drains/Airways    Name:   Placement date:   Placement time:   Site:   Days:   Peripheral IV 10/27/18 Left Antecubital   10/27/18    -    Antecubital   less than 1          Intake/Output Last 24 hours No intake or output data in the 24 hours ending 10/27/18 1701  Labs/Imaging Results for orders placed or performed during the hospital encounter of 10/27/18 (from the past 48 hour(s))  Basic metabolic panel     Status: Abnormal   Collection Time: 10/27/18  1:30 PM  Result Value Ref Range   Sodium 134 (L) 135 - 145 mmol/L    Potassium 3.1 (L) 3.5 - 5.1 mmol/L   Chloride 96 (L) 98 - 111 mmol/L   CO2 22 22 - 32 mmol/L   Glucose, Bld 159 (H) 70 - 99 mg/dL   BUN 16 8 - 23 mg/dL   Creatinine, Ser 5.63 0.44 - 1.00 mg/dL   Calcium 9.5 8.9 - 87.5 mg/dL   GFR calc non Af Amer >60 >60 mL/min   GFR calc Af Amer >60 >60 mL/min   Anion gap 16 (H) 5 - 15    Comment: Performed at [facility], [address], [place], [place] [address]  Brain natriuretic peptide     Status: Abnormal   Collection Time: 10/27/18  1:30 PM  Result Value Ref Range   B Natriuretic Peptide 211.9 (H) 0.0 - 100.0 pg/mL    Comment: Performed at [facility], [address], [place], [place] [address]  Troponin I - Once     Status: None   Collection Time: 10/27/18  1:30 PM  Result Value Ref Range   Troponin I <0.03 <0.03 ng/mL    Comment: Performed at [facility], [address], [place], [place] [address]  CBC with Differential     Status: Abnormal   Collection Time: 10/27/18  1:30 PM  Result Value Ref Range   WBC 6.8 4.0 - 10.5 K/uL   RBC 5.01 3.87 - 5.11 MIL/uL   Hemoglobin 16.0 (H) 12.0 -  15.0 g/dL   HCT 16.1 (H) 09.6 - 04.5 %   MCV 93.6 80.0 - 100.0 fL   MCH 31.9 26.0 - 34.0 pg   MCHC 34.1 30.0 - 36.0 g/dL   RDW 40.9 81.1 - 91.4 %   Platelets 177 150 - 400 K/uL   nRBC 0.0 0.0 - 0.2 %   Neutrophils Relative % 77 %   Neutro Abs 5.3 1.7 - 7.7 K/uL   Lymphocytes Relative 12 %   Lymphs Abs 0.8 0.7 - 4.0 K/uL   Monocytes Relative 9 %   Monocytes Absolute 0.6 0.1 - 1.0 K/uL   Eosinophils Relative 1 %   Eosinophils Absolute 0.0 0.0 - 0.5 K/uL   Basophils Relative 1 %   Basophils Absolute 0.0 0.0 - 0.1 K/uL   Immature Granulocytes 0 %   Abs Immature Granulocytes 0.03 0.00 - 0.07 K/uL    Comment: Performed at [facility], [address] [place], [place] [address]  Magnesium     Status: Abnormal   Collection Time: 10/27/18  1:30 PM  Result Value Ref Range   Magnesium 1.6 (L) 1.7 - 2.4 mg/dL    Comment:  Performed at [facility], [address], [place], [place] [address]   Dg Chest 2 View  Result Date: 10/27/2018 CLINICAL DATA:  Dyspnea EXAM: CHEST - 2 VIEW COMPARISON:  07/09/2004 chest radiograph. FINDINGS: Stable cardiomediastinal silhouette with mild cardiomegaly. No pneumothorax. No pleural effusion. No overt pulmonary edema. Hazy reticular opacities at both lung bases. Hyperinflated lungs. IMPRESSION: 1. Hyperinflated lungs, suggesting COPD. 2. Hazy reticular opacities at both lung bases worrisome for interstitial lung disease. Consider further evaluation with high-resolution chest CT, which may be performed on a short term outpatient basis as clinically warranted. 3. Mild cardiomegaly without overt pulmonary edema. Electronically Signed   By: Delbert Phenix M.D.   On: 10/27/2018 14:53   Ct Chest High Resolution  Result Date: 10/27/2018 CLINICAL DATA:  Dyspnea. COPD. Reticular opacities at the lung bases on chest radiograph. EXAM: CT CHEST WITHOUT CONTRAST TECHNIQUE: Multidetector CT imaging of the chest was performed following the standard protocol without intravenous contrast. High resolution imaging of the lungs, as well as inspiratory and expiratory imaging, was performed. COMPARISON:  Chest radiograph from earlier today. FINDINGS: Limited motion degraded scan. Cardiovascular: Mild cardiomegaly. No significant pericardial effusion/thickening. Three-vessel coronary atherosclerosis. Atherosclerotic nonaneurysmal thoracic aorta. Borderline prominent main pulmonary artery (3.4 cm diameter). Mediastinum/Nodes: No discrete thyroid nodules. Unremarkable esophagus. No axillary adenopathy. Right paratracheal adenopathy up to 1.7 cm (series 5/image 53). Enlarged 1.3 cm subcarinal node (series 5/image 67). Enlarged 1.1 cm AP window node (series 5/image 49). No discrete hilar adenopathy on this noncontrast scan. Lungs/Pleura: No pneumothorax. No pleural effusion. Moderate to severe centrilobular emphysema  with diffuse bronchial wall thickening. There is extensive patchy confluent subpleural reticulation and ground-glass attenuation in both lungs with associated subpleural lines and mild traction bronchiectasis and mild architectural distortion. There is a strong basilar predominance to these findings. No frank honeycombing. No significant lobular air trapping on the expiration sequence. No lung masses or significant pulmonary nodules. Upper abdomen: Cholecystectomy. Simple 3.8 cm anterior interpolar left renal cyst. Musculoskeletal: No aggressive appearing focal osseous lesions. Marked thoracic spondylosis. IMPRESSION: 1. Very limited motion degraded scan. 2. 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 Given the limitations of this scan, suggest attention on a follow-up high-resolution chest CT study in 3 months. Findings are categorized as probable  UIP per consensus guidelines: Diagnosis of Idiopathic Pulmonary Fibrosis: An Official ATS/ERS/JRS/ALAT Clinical Practice Guideline. Am Rosezetta Schlatter Crit Care Med Vol 198, Iss 5, [phone], Apr 22 2017. 3. Three-vessel coronary atherosclerosis. 4. Mild cardiomegaly. 5. Nonspecific mild bilateral mediastinal adenopathy, potentially reactive, which can also be reassessed on short-term follow-up chest CT. Aortic Atherosclerosis (ICD10-I70.0) and Emphysema (ICD10-J43.9). Electronically Signed   By: Delbert Phenix M.D.   On: 10/27/2018 16:08    Pending Labs Unresulted Labs (From admission, onward)    Start     Ordered   11/03/18 0500  Creatinine, serum  (enoxaparin (LOVENOX)    CrCl >/= 30 ml/min)  Weekly,   R    Comments:  while on enoxaparin therapy    10/27/18 1625   10/28/18 0500  Basic metabolic panel  Tomorrow morning,   R     10/27/18 1625   10/28/18 0500  CBC  Tomorrow morning,   R     10/27/18 1625   10/28/18 0500  Magnesium  Once,   R     10/27/18 1632   10/27/18 1622  CBC  (enoxaparin  (LOVENOX)    CrCl >/= 30 ml/min)  Once,   R    Comments:  Baseline for enoxaparin therapy IF NOT ALREADY DRAWN.  Notify MD if PLT < 100 K.    10/27/18 1625   10/27/18 1622  Creatinine, serum  (enoxaparin (LOVENOX)    CrCl >/= 30 ml/min)  Once,   R    Comments:  Baseline for enoxaparin therapy IF NOT ALREADY DRAWN.    10/27/18 1625   10/27/18 1315  Influenza panel by PCR (type A & B)  (Influenza PCR Panel)  ONCE - STAT,   R     10/27/18 1314          Vitals/Pain Today's Vitals   10/27/18 1256 10/27/18 1259 10/27/18 1330 10/27/18 1400  BP:  (!) 176/78 (!) 153/78 (!) 154/78  Pulse:   69 79  Resp:  16 11 14   Temp:  97.8 F (36.6 C)    TempSrc:  Oral    SpO2:  91% 97% 99%  PainSc: 0-No pain       Isolation Precautions Droplet precaution  Medications Medications  methylPREDNISolone sodium succinate (SOLU-MEDROL) 125 mg/2 mL injection 60 mg (has no administration in time range)  enoxaparin (LOVENOX) injection 40 mg (has no administration in time range)  acetaminophen (TYLENOL) tablet 650 mg (has no administration in time range)    Or  acetaminophen (TYLENOL) suppository 650 mg (has no administration in time range)  traZODone (DESYREL) tablet 50 mg (has no administration in time range)  senna-docusate (Senokot-S) tablet 1 tablet (has no administration in time range)  ondansetron (ZOFRAN) tablet 4 mg (has no administration in time range)    Or  ondansetron (ZOFRAN) injection 4 mg (has no administration in time range)  cefTRIAXone (ROCEPHIN) 1 g in sodium chloride 0.9 % 100 mL IVPB (has no administration in time range)  predniSONE (DELTASONE) tablet 40 mg (has no administration in time range)  ipratropium-albuterol (DUONEB) 0.5-2.5 (3) MG/3ML nebulizer solution 3 mL (has no administration in time range)  albuterol (PROVENTIL) (2.5 MG/3ML) 0.083% nebulizer solution 2.5 mg (has no administration in time range)  diltiazem (CARDIZEM LA) 24 hr tablet 360 mg (has no administration in  time range)  hydrochlorothiazide (HYDRODIURIL) tablet 25 mg (has no administration in time range)  metoprolol tartrate (LOPRESSOR) tablet 12.5 mg (has no administration in time range)  irbesartan (AVAPRO) tablet  300 mg (has no administration in time range)  potassium chloride SA (K-DUR,KLOR-CON) CR tablet 20 mEq (has no administration in time range)  magnesium gluconate (MAGONATE) tablet 500 mg (has no administration in time range)  ipratropium-albuterol (DUONEB) 0.5-2.5 (3) MG/3ML nebulizer solution 3 mL (3 mLs Nebulization Given 10/27/18 1342)    Mobility walks Moderate fall risk   Focused Assessments    R Recommendations: See Admitting Provider Note  Report given to:   Additional Notes:

## 2018-10-27 NOTE — H&P (Signed)
History and Physical    [name] BRA:[id number] DOB: 1925/12/17 DOA: 10/27/2018  PCP: Tally Joe, MD  Patient coming from: home  Chief Complaint: sob  HPI: [name] is a 85 y.o. female with medical history significant of COPD, HTN.  Around 1 week ago, the patient had an upper respiratory infection.  Since that time, she has been having worsening shortness of breath, cough and wheezing.  She does have a history of COPD and follows with the [facility] pulmonology clinic.  They took her off all of her inhalers as they were ineffective.  She specifically denies fevers, nasal congestion, sore throat, ear pain/drainage from her ears, or muscle aches.  ED Course: Chest x-ray suspicious for interstitial lung changes.  CT scan ordered by ED provider with recommendation for 77-month follow-up.  Hypokalemia and hypomagnesemia.  She is found to have oxygen saturation levels of 78% and was placed on nasal cannula 2 L with improvement in both breathing and oxygen saturation.  Review of Systems: As per HPI otherwise 10 point review of systems negative.   Past Medical History:  Diagnosis Date  . COPD (chronic obstructive pulmonary disease) (HCC)   . Hypertension     Allergies  Allergen Reactions  . Bisoprolol-Hydrochlorothiazide Other (See Comments)    Other reaction(s): Cough (ALLERGY/intolerance)  . Meperidine Nausea And Vomiting  . Tetracycline Hives     Prior to Admission medications   Medication Sig Start Date End Date Taking? Authorizing Provider  diltiazem (CARDIZEM LA) 360 MG 24 hr tablet Take 360 mg by mouth daily.   Yes [provider]  hydrochlorothiazide (HYDRODIURIL) 25 MG tablet Take 25 mg by mouth daily.   Yes [provider]  metoprolol tartrate (LOPRESSOR) 25 MG tablet Take 12.5 mg by mouth daily.   Yes [provider]  olmesartan (BENICAR) 40 MG tablet Take 40 mg by mouth daily.   Yes [provider]    Physical Exam: Vitals:   10/27/18 1253 10/27/18 1259 10/27/18 1330 10/27/18 1400  BP:  (!) 176/78 (!) 153/78 (!) 154/78  Pulse:   69 79  Resp:  16 11 14   Temp:  97.8 F (36.6 C)    TempSrc:  Oral    SpO2: (!) 78% 91% 97% 99%    Constitutional: NAD, calm, comfortable Eyes: PERRL, lids and conjunctivae normal ENMT: Mucous membranes are moist. Posterior pharynx clear of any exudate or lesions.Normal dentition.  Neck: normal, supple, no masses, no thyromegaly Respiratory: Diffuse expiratory wheezing, no rales or stridor. Normal respiratory effort. No accessory muscle use.  Cardiovascular: RRR. No extremity edema. 2+ pedal pulses. No carotid bruits.  Abdomen: no tenderness, no masses palpated. No hepatosplenomegaly. Bowel sounds positive.  Musculoskeletal: no clubbing / cyanosis. No joint deformity upper and lower extremities. Good ROM, no contractures. Normal muscle tone.  Skin: no rashes, lesions, ulcers.  Neurologic: CN 2-12 grossly intact. Sensation intact, DTR normal. Strength 5/5 in all 4.  Psychiatric: Normal judgment and insight. Alert and oriented x 3. Normal mood.   Labs on Admission: I have personally reviewed following labs and imaging studies  CBC: Recent Labs  Lab 10/27/18 1330  WBC 6.8  NEUTROABS 5.3  HGB 16.0*  HCT 46.9*  MCV 93.6  PLT 177   Basic Metabolic Panel: Recent Labs  Lab 10/27/18 1330  NA 134*  K 3.1*  CL 96*  CO2 22  GLUCOSE 159*  BUN 16  CREATININE 0.85  CALCIUM 9.5  MG 1.6*   GFR: CrCl cannot  be calculated (Unknown ideal weight.). Liver Function Tests: No results for input(s): AST, ALT, ALKPHOS, BILITOT, PROT, ALBUMIN in the last 168 hours. No results for input(s): LIPASE, AMYLASE in the last 168 hours. No results for input(s): AMMONIA in the last 168 hours. Coagulation Profile: No results for input(s): INR, PROTIME in the last 168 hours. Cardiac Enzymes: Recent Labs  Lab 10/27/18 1330  TROPONINI <0.03   Radiological Exams on Admission: Dg Chest 2  View  Result Date: 10/27/2018 CLINICAL DATA:  Dyspnea EXAM: CHEST - 2 VIEW COMPARISON:  07/09/2004 chest radiograph. FINDINGS: Stable cardiomediastinal silhouette with mild cardiomegaly. No pneumothorax. No pleural effusion. No overt pulmonary edema. Hazy reticular opacities at both lung bases. Hyperinflated lungs. IMPRESSION: 1. Hyperinflated lungs, suggesting COPD. 2. Hazy reticular opacities at both lung bases worrisome for interstitial lung disease. Consider further evaluation with high-resolution chest CT, which may be performed on a short term outpatient basis as clinically warranted. 3. Mild cardiomegaly without overt pulmonary edema. Electronically Signed   By: Delbert Phenix M.D.   On: 10/27/2018 14:53   Ct Chest High Resolution  Result Date: 10/27/2018 CLINICAL DATA:  Dyspnea. COPD. Reticular opacities at the lung bases on chest radiograph. EXAM: CT CHEST WITHOUT CONTRAST TECHNIQUE: Multidetector CT imaging of the chest was performed following the standard protocol without intravenous contrast. High resolution imaging of the lungs, as well as inspiratory and expiratory imaging, was performed. COMPARISON:  Chest radiograph from earlier today. FINDINGS: Limited motion degraded scan. Cardiovascular: Mild cardiomegaly. No significant pericardial effusion/thickening. Three-vessel coronary atherosclerosis. Atherosclerotic nonaneurysmal thoracic aorta. Borderline prominent main pulmonary artery (3.4 cm diameter). Mediastinum/Nodes: No discrete thyroid nodules. Unremarkable esophagus. No axillary adenopathy. Right paratracheal adenopathy up to 1.7 cm (series 5/image 53). Enlarged 1.3 cm subcarinal node (series 5/image 67). Enlarged 1.1 cm AP window node (series 5/image 49). No discrete hilar adenopathy on this noncontrast scan. Lungs/Pleura: No pneumothorax. No pleural effusion. Moderate to severe centrilobular emphysema with diffuse bronchial wall thickening. There is extensive patchy confluent subpleural  reticulation and ground-glass attenuation in both lungs with associated subpleural lines and mild traction bronchiectasis and mild architectural distortion. There is a strong basilar predominance to these findings. No frank honeycombing. No significant lobular air trapping on the expiration sequence. No lung masses or significant pulmonary nodules. Upper abdomen: Cholecystectomy. Simple 3.8 cm anterior interpolar left renal cyst. Musculoskeletal: No aggressive appearing focal osseous lesions. Marked thoracic spondylosis. IMPRESSION: 1. Very limited motion degraded scan. 2. 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 Given the limitations of this scan, suggest attention on a follow-up high-resolution chest CT study in 3 months. Findings are categorized as probable UIP per consensus guidelines: Diagnosis of Idiopathic Pulmonary Fibrosis: An Official ATS/ERS/JRS/ALAT Clinical Practice Guideline. Am Rosezetta Schlatter Crit Care Med Vol 198, Iss 5, [phone], Apr 22 2017. 3. Three-vessel coronary atherosclerosis. 4. Mild cardiomegaly. 5. Nonspecific mild bilateral mediastinal adenopathy, potentially reactive, which can also be reassessed on short-term follow-up chest CT. Aortic Atherosclerosis (ICD10-I70.0) and Emphysema (ICD10-J43.9). Electronically Signed   By: Delbert Phenix M.D.   On: 10/27/2018 16:08    EKG: Independently reviewed.  Assessment/Plan Principal Problem:   COPD exacerbation (HCC)  Rocephin 1 g/d for 5 d  Supp O2  Scheduled Duoneb q 4 h  Prn albuterol neb q 2  Prednisone 40 mg/d for 5 d   Active Problems:   Acute respiratory failure  As above, wean as able  Maintain sats >89   Essential hypertension  Cont  home meds   DVT prophylaxis: Lovenox Code Status: DNR Family Communication: Patient and daughter Disposition Plan: Home Consults called: None Admission status: Patient  Severity of Illness: The appropriate  patient status for this patient is INPATIENT. Inpatient status is judged to be reasonable and necessary in order to provide the required intensity of service to ensure the patient's safety. The patient's presenting symptoms, physical exam findings, and initial radiographic and laboratory data in the context of their chronic comorbidities is felt to place them at high risk for further clinical deterioration. Furthermore, it is not anticipated that the patient will be medically stable for discharge from the hospital within 2 midnights of admission. The following factors support the patient status of inpatient.   " The patient's presenting symptoms include as of breath and acute respiratory failure. " The worrisome physical exam findings include oxygen saturation and wheezing. " The initial radiographic and laboratory data are worrisome because of pulmonary fibrosis. " The chronic co-morbidities include COPD and hypertension.   * I certify that at the point of admission it is my clinical judgment that the patient will require inpatient hospital care spanning beyond 2 midnights from the point of admission due to high intensity of service, high risk for further deterioration and high frequency of surveillance required.*   Sharlene DoryNicholas Paul Leshawn Houseworth, DO [facility] [email or web address] 10/27/2018, 4:29 PM

## 2018-10-27 NOTE — Progress Notes (Signed)
[name] is a 85 y.o. female patient admitted from ED awake, alert - oriented  X 4 - no acute distress noted. IV in place, occlusive dsg intact without redness.  Orientation to room, and floor completed with information packet given to patient/family.  Patient declined safety video at this time.  Admission INP armband ID verified with patient/family, and in place.   SR up x 2, fall assessment complete, with patient and family able to verbalize understanding of risk associated with falls, and verbalized understanding to call nsg before up out of bed.  Call light within reach, patient able to voice, and demonstrate understanding.  Skin, clean-dry- intact without evidence of bruising, or skin tears.   No evidence of skin break down noted on exam.     Will cont to eval and treat per MD orders.  Jeralene Peters, RN 10/27/2018 6:41 PM

## 2018-10-28 DIAGNOSIS — J441 Chronic obstructive pulmonary disease with (acute) exacerbation: Principal | ICD-10-CM

## 2018-10-28 LAB — INFLUENZA PANEL BY PCR (TYPE A & B)
INFLAPCR: NEGATIVE
INFLBPCR: NEGATIVE

## 2018-10-28 LAB — BASIC METABOLIC PANEL
Anion gap: 13 (ref 5–15)
BUN: 14 mg/dL (ref 8–23)
CALCIUM: 9.3 mg/dL (ref 8.9–10.3)
CO2: 22 mmol/L (ref 22–32)
Chloride: 97 mmol/L — ABNORMAL LOW (ref 98–111)
Creatinine, Ser: 0.86 mg/dL (ref 0.44–1.00)
GFR calc Af Amer: >60 mL/min (ref >60)
GFR calc non Af Amer: 59 mL/min — ABNORMAL LOW (ref >60)
GLUCOSE: 340 mg/dL — AB (ref 70–99)
Potassium: 3.2 mmol/L — ABNORMAL LOW (ref 3.5–5.1)
Sodium: 132 mmol/L — ABNORMAL LOW (ref 135–145)

## 2018-10-28 LAB — BRAIN NATRIURETIC PEPTIDE: B Natriuretic Peptide: 437.3 pg/mL — ABNORMAL HIGH (ref 0.0–100.0)

## 2018-10-28 LAB — MAGNESIUM: Magnesium: 1.8 mg/dL (ref 1.7–2.4)

## 2018-10-28 MED ORDER — DILTIAZEM HCL ER COATED BEADS 180 MG PO CP24
360.0000 mg | ORAL_CAPSULE | Freq: Every day | ORAL | Status: DC
  Administered 2018-10-29 – 2018-11-01 (×4): 360 mg via ORAL
  Filled 2018-10-28 (×5): qty 2

## 2018-10-28 MED ORDER — FUROSEMIDE 10 MG/ML IJ SOLN
60.0000 mg | Freq: Once | INTRAMUSCULAR | Status: AC
  Administered 2018-10-28: 60 mg via INTRAVENOUS
  Filled 2018-10-28: qty 6

## 2018-10-28 MED ORDER — FUROSEMIDE 40 MG PO TABS
40.0000 mg | ORAL_TABLET | Freq: Once | ORAL | Status: AC
  Administered 2018-10-28: 40 mg via ORAL
  Filled 2018-10-28: qty 1

## 2018-10-28 MED ORDER — POTASSIUM CHLORIDE CRYS ER 20 MEQ PO TBCR
40.0000 meq | EXTENDED_RELEASE_TABLET | Freq: Once | ORAL | Status: AC
  Administered 2018-10-28: 40 meq via ORAL
  Filled 2018-10-28: qty 2

## 2018-10-28 MED ORDER — MAGNESIUM SULFATE 2 GM/50ML IV SOLN
2.0000 g | Freq: Once | INTRAVENOUS | Status: AC
  Administered 2018-10-28: 2 g via INTRAVENOUS
  Filled 2018-10-28: qty 50

## 2018-10-28 NOTE — Progress Notes (Signed)
Pt refused to take diltiazem, irbesartan and metoprolol stating she takes them at night. Pharmacy messaged to adjust times.

## 2018-10-28 NOTE — Progress Notes (Signed)
PROGRESS NOTE                                                                                                                                                                                                             Patient Demographics:    [name], is a 85 y.o. female, DOB - 1926-07-22, EUM:[id number]  Admit date - 10/27/2018   Admitting Physician Sharlene Dory, DO  Outpatient Primary MD for the patient is Tally Joe, MD  LOS - 1  Chief Complaint  Patient presents with  . Shortness of Breath       Brief Narrative  [name] is a 85 y.o. female with medical history significant of COPD, HTN.  Around 1 week ago, the patient had an upper respiratory infection.  Since that time, she has been having worsening shortness of breath, cough and wheezing.  She does have a history of mild COPD and follows with the [facility] pulmonology clinic, further work-up here suggest CHF.   Subjective:    [name] today has, No headache, No chest pain, No abdominal pain - No Nausea, No new weakness tingling or numbness, No Cough - Mild SOB.     Assessment  & Plan :     1.  Acute hypoxic respiratory failure with orthopnea.  Exam consistent with acute on chronic CHF.  No previous echocardiogram on file, echo will be ordered, will initiate IV Lasix, fluid restriction, taper off oral prednisone, continue supplemental oxygen and nebulizer treatments.  Salt and fluid restriction.  Monitor electrolytes intake and output.  Will monitor closely.  2.  COPD and questionable UIP on CT scan.  Outpatient pulmonary follow-up follows with [facility] pulmonary clinic.  3.  Essential hypertension.  On combination of beta-blocker, calcium channel blocker and ARB.  Continue.  4.  Hypokalemia.  Replace and monitor.     Family Communication  : None  Code Status :  DNR  Disposition Plan  :  TBD  Consults  :  None  Procedures  :    CT  1. Very limited motion degraded scan. 2. Spectrum of  findings suggestive of basilar predominant fibrotic interstitial lung disease, without frank honeycombing. Given the limitations of this scan, suggest attention on a follow-up high-resolution chest CT study in 3 months. Findings are categorized as probable UIP per consensus guidelines: Diagnosis of Idiopathic Pulmonary Fibrosis: An Official ATS/ERS/JRS/ALAT Clinical Practice Guideline. Am Rosezetta Schlatter Crit Care Med Vol 198, Iss 5, [phone], Apr 22 2017. 3.  Three-vessel coronary atherosclerosis. 4. Mild cardiomegaly. 5. Nonspecific mild bilateral mediastinal adenopathy, potentially reactive, which can also be reassessed on short-term follow-up chest CT. Aortic Atherosclerosis (ICD10-I70.0) and Emphysema (ICD10-J43.9).  TTE   DVT Prophylaxis  :  Lovenox    Lab Results  Component Value Date   PLT 184 10/27/2018    Diet :  Diet Order            Diet regular Room service appropriate? Yes; Fluid consistency: Thin  Diet effective now               Inpatient Medications Scheduled Meds: . diltiazem  360 mg Oral Daily  . enoxaparin (LOVENOX) injection  40 mg Subcutaneous Q24H  . ipratropium-albuterol  3 mL Nebulization Q4H  . irbesartan  300 mg Oral Daily  . metoprolol tartrate  12.5 mg Oral Daily  . potassium chloride  40 mEq Oral Once  . predniSONE  40 mg Oral Q breakfast   Continuous Infusions: . cefTRIAXone (ROCEPHIN)  IV Stopped (10/28/18 0032)  . magnesium sulfate 1 - 4 g bolus IVPB 2 g (10/28/18 0930)   PRN Meds:.acetaminophen **OR** [DISCONTINUED] acetaminophen, albuterol, benzonatate, [DISCONTINUED] ondansetron **OR** ondansetron (ZOFRAN) IV, senna-docusate, traZODone  Antibiotics  :   Anti-infectives (From admission, onward)   Start     Dose/Rate Route Frequency Ordered Stop   10/27/18 1915  cefTRIAXone (ROCEPHIN) 1 g in sodium chloride 0.9 % 100 mL IVPB     1 g 200 mL/hr over 30 Minutes Intravenous Every 24 hours 10/27/18 1625 11/01/18 1914          Objective:   Vitals:    10/27/18 2341 10/28/18 0408 10/28/18 0418 10/28/18 0731  BP:   (!) 151/78   Pulse:   62 61  Resp:   16 16  Temp:   97.9 F (36.6 C)   TempSrc:   Oral   SpO2: 94% 95% 93% 94%    Wt Readings from Last 3 Encounters:  No data found for Wt     Intake/Output Summary (Last 24 hours) at 10/28/2018 1000 Last data filed at 10/28/2018 0600 Gross per 24 hour  Intake 220.66 ml  Output -  Net 220.66 ml     Physical Exam  Awake Alert, Oriented X 3, No new F.N deficits, Normal affect [name].AT,PERRAL Supple Neck,No JVD, No cervical lymphadenopathy appriciated.  Symmetrical Chest wall movement, Good air movement bilaterally, +ve rales and mild wheezing RRR,No Gallops,Rubs or new Murmurs, No Parasternal Heave +ve B.Sounds, Abd Soft, No tenderness, No organomegaly appriciated, No rebound - guarding or rigidity. No Cyanosis, Clubbing or edema, No new Rash or bruise     Data Review:    CBC Recent Labs  Lab 10/27/18 1330 10/27/18 1822  WBC 6.8 8.2  HGB 16.0* 15.6*  HCT 46.9* 44.7  PLT 177 184  MCV 93.6 92.0  MCH 31.9 32.1  MCHC 34.1 34.9  RDW 13.2 13.1  LYMPHSABS 0.8  --   MONOABS 0.6  --   EOSABS 0.0  --   BASOSABS 0.0  --     Chemistries  Recent Labs  Lab 10/27/18 1330 10/27/18 1822 10/28/18 0709  NA 134*  --   --   K 3.1*  --   --   CL 96*  --   --   CO2 22  --   --   GLUCOSE 159*  --   --   BUN 16  --   --   CREATININE 0.85 0.82  --  CALCIUM 9.5  --   --   MG 1.6*  --  1.8   ------------------------------------------------------------------------------------------------------------------ No results for input(s): CHOL, HDL, LDLCALC, TRIG, CHOLHDL, LDLDIRECT in the last 72 hours.  No results found for: HGBA1C ------------------------------------------------------------------------------------------------------------------ No results for input(s): TSH, T4TOTAL, T3FREE, THYROIDAB in the last 72 hours.  Invalid input(s):  FREET3 ------------------------------------------------------------------------------------------------------------------ No results for input(s): VITAMINB12, FOLATE, FERRITIN, TIBC, IRON, RETICCTPCT in the last 72 hours.  Coagulation profile No results for input(s): INR, PROTIME in the last 168 hours.  No results for input(s): DDIMER in the last 72 hours.  Cardiac Enzymes Recent Labs  Lab 10/27/18 1330  TROPONINI <0.03   ------------------------------------------------------------------------------------------------------------------    Component Value Date/Time   BNP 211.9 (H) 10/27/2018 1330    Micro Results No results found for this or any previous visit (from the past 240 hour(s)).  Radiology Reports Dg Chest 2 View  Result Date: 10/27/2018 CLINICAL DATA:  Dyspnea EXAM: CHEST - 2 VIEW COMPARISON:  07/09/2004 chest radiograph. FINDINGS: Stable cardiomediastinal silhouette with mild cardiomegaly. No pneumothorax. No pleural effusion. No overt pulmonary edema. Hazy reticular opacities at both lung bases. Hyperinflated lungs. IMPRESSION: 1. Hyperinflated lungs, suggesting COPD. 2. Hazy reticular opacities at both lung bases worrisome for interstitial lung disease. Consider further evaluation with high-resolution chest CT, which may be performed on a short term outpatient basis as clinically warranted. 3. Mild cardiomegaly without overt pulmonary edema. Electronically Signed   By: Delbert Phenix M.D.   On: 10/27/2018 14:53   Ct Chest High Resolution  Result Date: 10/27/2018 CLINICAL DATA:  Dyspnea. COPD. Reticular opacities at the lung bases on chest radiograph. EXAM: CT CHEST WITHOUT CONTRAST TECHNIQUE: Multidetector CT imaging of the chest was performed following the standard protocol without intravenous contrast. High resolution imaging of the lungs, as well as inspiratory and expiratory imaging, was performed. COMPARISON:  Chest radiograph from earlier today. FINDINGS: Limited motion  degraded scan. Cardiovascular: Mild cardiomegaly. No significant pericardial effusion/thickening. Three-vessel coronary atherosclerosis. Atherosclerotic nonaneurysmal thoracic aorta. Borderline prominent main pulmonary artery (3.4 cm diameter). Mediastinum/Nodes: No discrete thyroid nodules. Unremarkable esophagus. No axillary adenopathy. Right paratracheal adenopathy up to 1.7 cm (series 5/image 53). Enlarged 1.3 cm subcarinal node (series 5/image 67). Enlarged 1.1 cm AP window node (series 5/image 49). No discrete hilar adenopathy on this noncontrast scan. Lungs/Pleura: No pneumothorax. No pleural effusion. Moderate to severe centrilobular emphysema with diffuse bronchial wall thickening. There is extensive patchy confluent subpleural reticulation and ground-glass attenuation in both lungs with associated subpleural lines and mild traction bronchiectasis and mild architectural distortion. There is a strong basilar predominance to these findings. No frank honeycombing. No significant lobular air trapping on the expiration sequence. No lung masses or significant pulmonary nodules. Upper abdomen: Cholecystectomy. Simple 3.8 cm anterior interpolar left renal cyst. Musculoskeletal: No aggressive appearing focal osseous lesions. Marked thoracic spondylosis. IMPRESSION: 1. Very limited motion degraded scan. 2. 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 Given the limitations of this scan, suggest attention on a follow-up high-resolution chest CT study in 3 months. Findings are categorized as probable UIP per consensus guidelines: Diagnosis of Idiopathic Pulmonary Fibrosis: An Official ATS/ERS/JRS/ALAT Clinical Practice Guideline. Am Rosezetta Schlatter Crit Care Med Vol 198, Iss 5, [phone], Apr 22 2017. 3. Three-vessel coronary atherosclerosis. 4. Mild cardiomegaly. 5. Nonspecific mild bilateral mediastinal adenopathy, potentially reactive, which can also  be reassessed on short-term follow-up chest CT. Aortic Atherosclerosis (ICD10-I70.0) and Emphysema (ICD10-J43.9). Electronically Signed   By: Delbert Phenix  M.D.   On: 10/27/2018 16:08    Time Spent in minutes  30   Susa Raring M.D on 10/28/2018 at 10:00 AM  To page go to [email or web address] - password [facility]

## 2018-10-29 ENCOUNTER — Encounter (HOSPITAL_COMMUNITY): Payer: Self-pay

## 2018-10-29 ENCOUNTER — Inpatient Hospital Stay (HOSPITAL_COMMUNITY): Payer: Medicare Other

## 2018-10-29 DIAGNOSIS — I5030 Unspecified diastolic (congestive) heart failure: Secondary | ICD-10-CM

## 2018-10-29 LAB — BASIC METABOLIC PANEL
Anion gap: 11 (ref 5–15)
BUN: 19 mg/dL (ref 8–23)
CO2: 25 mmol/L (ref 22–32)
Calcium: 9.1 mg/dL (ref 8.9–10.3)
Chloride: 96 mmol/L — ABNORMAL LOW (ref 98–111)
Creatinine, Ser: 1.03 mg/dL — ABNORMAL HIGH (ref 0.44–1.00)
GFR calc Af Amer: 55 mL/min — ABNORMAL LOW (ref >60)
GFR calc non Af Amer: 48 mL/min — ABNORMAL LOW (ref >60)
Glucose, Bld: 234 mg/dL — ABNORMAL HIGH (ref 70–99)
Potassium: 4 mmol/L (ref 3.5–5.1)
Sodium: 132 mmol/L — ABNORMAL LOW (ref 135–145)

## 2018-10-29 LAB — ECHOCARDIOGRAM COMPLETE
Height: 63 in
Weight: 3326.3 oz

## 2018-10-29 LAB — MAGNESIUM: Magnesium: 1.9 mg/dL (ref 1.7–2.4)

## 2018-10-29 MED ORDER — FUROSEMIDE 10 MG/ML IJ SOLN
40.0000 mg | Freq: Two times a day (BID) | INTRAMUSCULAR | Status: DC
  Administered 2018-10-29 – 2018-11-01 (×6): 40 mg via INTRAVENOUS
  Filled 2018-10-29 (×7): qty 4

## 2018-10-29 MED ORDER — IPRATROPIUM-ALBUTEROL 0.5-2.5 (3) MG/3ML IN SOLN
3.0000 mL | Freq: Three times a day (TID) | RESPIRATORY_TRACT | Status: DC
  Administered 2018-10-29 – 2018-11-01 (×9): 3 mL via RESPIRATORY_TRACT
  Filled 2018-10-29 (×11): qty 3

## 2018-10-29 MED ORDER — IRBESARTAN 75 MG PO TABS
75.0000 mg | ORAL_TABLET | Freq: Every day | ORAL | Status: DC
  Administered 2018-10-29: 75 mg via ORAL
  Filled 2018-10-29 (×2): qty 1

## 2018-10-29 MED ORDER — FUROSEMIDE 10 MG/ML IJ SOLN: 40.0000 mg | Freq: Once | INTRAMUSCULAR | Status: DC

## 2018-10-29 NOTE — Progress Notes (Signed)
Nutrition Brief Note  RD consulted to assess patient's nutrition status and needs.  Wt Readings from Last 15 Encounters:  10/29/18 94.3 kg  Limited wt hx available - pt reports intentional 7 lb wt loss x 1 year  Body mass index is 36.83 kg/m. Patient meets criteria for obese, class 2 based on current BMI.   Current diet order is Regular, patient is consuming approximately 100% of meals at this time. Labs and medications reviewed.   No nutrition interventions warranted at this time. If nutrition issues arise, please consult RD.   Jolaine Artist, MS, RDN, LDN Pager: [phone] Available Mondays and Fridays, 9am-2pm

## 2018-10-29 NOTE — Progress Notes (Signed)
PROGRESS NOTE                                                                                                                                                                                                             Patient Demographics:    [name], is a 85 y.o. female, DOB - 1926-03-13, MWU:[id number]  Admit date - 10/27/2018   Admitting Physician Sharlene Dory, DO  Outpatient Primary MD for the patient is Tally Joe, MD  LOS - 2  Chief Complaint  Patient presents with  . Shortness of Breath       Brief Narrative  [name] is a 85 y.o. female with medical history significant of COPD, HTN, around 1 week ago, the patient had an upper respiratory infection.  Since that time, she has been having worsening shortness of breath, cough and wheezing.  She does have a history of mild COPD and follows with the [facility] pulmonology clinic, further work-up here suggest CHF.   Subjective:    [name] today has, No headache, No chest pain, No abdominal pain - No Nausea, No new weakness tingling or numbness, No Cough - Mild SOB.     Assessment  & Plan :     1.  Acute hypoxic respiratory failure with orthopnea.  Exam consistent with acute on chronic CHF.  No previous echocardiogram on file, echo has been ordered and is pending, has been diuresed with IV Lasix which we will continue, steroids have been stopped, shortness of breath has improved, continue low-dose beta-blocker, case discussed with pulmonary physician Dr Molli Knock.  Does not think she has UIP.  Continue to monitor with supportive care.  2.  COPD and questionable UIP on CT scan.  Outpatient pulmonary follow-up follows with [facility] pulmonary clinic.  3.  Essential hypertension.  On combination of beta-blocker, calcium channel blocker and ARB ARB dose lowered due to ongoing diuresis.  Continue to monitor.  4.  Hypokalemia.  Replaced and stable.     Family Communication  : None  Code Status :   DNR  Disposition Plan  :  TBD  Consults  :  None  Procedures  :    CT  1. Very limited motion degraded scan. 2. 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 Given the limitations of this scan, suggest attention on a follow-up high-resolution chest CT study in 3 months. Findings are categorized as probable UIP per consensus guidelines: Diagnosis of Idiopathic  Pulmonary Fibrosis: An Official ATS/ERS/JRS/ALAT Clinical Practice Guideline. Am [name] Vol 198, Iss 5, [phone], Apr 22 2017. 3. Three-vessel coronary atherosclerosis. 4. Mild cardiomegaly. 5. Nonspecific mild bilateral mediastinal adenopathy, potentially reactive, which can also be reassessed on short-term follow-up chest CT. Aortic Atherosclerosis (ICD10-I70.0) and Emphysema (ICD10-J43.9).  TTE   DVT Prophylaxis  :  Lovenox    Lab Results  Component Value Date   PLT 184 10/27/2018    Diet :  Diet Order            Diet regular Room service appropriate? Yes; Fluid consistency: Thin  Diet effective now               Inpatient Medications Scheduled Meds: . diltiazem  360 mg Oral Daily  . enoxaparin (LOVENOX) injection  40 mg Subcutaneous Q24H  . furosemide  40 mg Intravenous BID  . ipratropium-albuterol  3 mL Nebulization TID  . irbesartan  300 mg Oral Daily  . metoprolol tartrate  12.5 mg Oral Daily   Continuous Infusions: . cefTRIAXone (ROCEPHIN)  IV 1 g (10/28/18 1913)   PRN Meds:.acetaminophen **OR** [DISCONTINUED] acetaminophen, albuterol, benzonatate, [DISCONTINUED] ondansetron **OR** ondansetron (ZOFRAN) IV, senna-docusate, traZODone  Antibiotics  :   Anti-infectives (From admission, onward)   Start     Dose/Rate Route Frequency Ordered Stop   10/27/18 1915  cefTRIAXone (ROCEPHIN) 1 g in sodium chloride 0.9 % 100 mL IVPB     1 g 200 mL/hr over 30 Minutes Intravenous Every 24 hours 10/27/18 1625 11/01/18 1914           Objective:   Vitals:   10/28/18 1407 10/28/18 2111 10/29/18 0447 10/29/18 0623  BP:  (!) 144/71 (!) 152/81   Pulse: 70 80 62   Resp: Temp:  97.6 F (36.4 C) 98.1 F (36.7 C)   TempSrc:  Oral    SpO2: 94% 90% 92%   Weight:    94.3 kg  Height:     (1.6 m)    Wt Readings from Last 3 Encounters:  10/29/18 94.3 kg     Intake/Output Summary (Last 24 hours) at 10/29/2018 0927 Last data filed at 10/29/2018 0446 Gross per 24 hour  Intake 600 ml  Output 3000 ml  Net -2400 ml     Physical Exam  Awake Alert, Oriented X 3, No new F.N deficits, Normal affect .AT,PERRAL Supple Neck,No JVD, No cervical lymphadenopathy appriciated.  Symmetrical Chest wall movement, Good air movement bilaterally, +ve rales RRR,No Gallops, Rubs or new Murmurs, No Parasternal Heave +ve B.Sounds, Abd Soft, No tenderness, No organomegaly appriciated, No rebound - guarding or rigidity. No Cyanosis, Clubbing, trace edema, No new Rash or bruise    Data Review:    CBC Recent Labs  Lab 10/27/18 1330 10/27/18 1822  WBC 6.8 8.2  HGB 16.0* 15.6*  HCT 46.9* 44.7  PLT 177 184  MCV 93.6 92.0  MCH 31.9 32.1  MCHC 34.1 34.9  RDW 13.2 13.1  LYMPHSABS 0.8  --   MONOABS 0.6  --   EOSABS 0.0  --   BASOSABS 0.0  --     Chemistries  Recent Labs  Lab 10/27/18 1330 10/27/18 1822 10/28/18 0709 10/28/18 1026 10/29/18 0417  NA 134*  --   --  132* 132*  K 3.1*  --   --  3.2* 4.0  CL 96*  --   --  97* 96*  CO2 22  --   --  22 25  GLUCOSE 159*  --   --  340* 234*  BUN 16  --   --  14 19  CREATININE 0.85 0.82  --  0.86 1.03*  CALCIUM 9.5  --   --  9.3 9.1  MG 1.6*  --  1.8  --  1.9   ------------------------------------------------------------------------------------------------------------------ No results for input(s): CHOL, HDL, LDLCALC, TRIG, CHOLHDL, LDLDIRECT in the last 72 hours.  No results found for:  HGBA1C ------------------------------------------------------------------------------------------------------------------ No results for input(s): TSH, T4TOTAL, T3FREE, THYROIDAB in the last 72 hours.  Invalid input(s): FREET3 ------------------------------------------------------------------------------------------------------------------ No results for input(s): VITAMINB12, FOLATE, FERRITIN, TIBC, IRON, RETICCTPCT in the last 72 hours.  Coagulation profile No results for input(s): INR, PROTIME in the last 168 hours.  No results for input(s): DDIMER in the last 72 hours.  Cardiac Enzymes Recent Labs  Lab 10/27/18 1330  TROPONINI <0.03   ------------------------------------------------------------------------------------------------------------------    Component Value Date/Time   BNP 437.3 (H) 10/28/2018 1026    Micro Results No results found for this or any previous visit (from the past 240 hour(s)).  Radiology Reports Dg Chest 2 View  Result Date: 10/27/2018 CLINICAL DATA:  Dyspnea EXAM: CHEST - 2 VIEW COMPARISON:  07/09/2004 chest radiograph. FINDINGS: Stable cardiomediastinal silhouette with mild cardiomegaly. No pneumothorax. No pleural effusion. No overt pulmonary edema. Hazy reticular opacities at both lung bases. Hyperinflated lungs. IMPRESSION: 1. Hyperinflated lungs, suggesting COPD. 2. Hazy reticular opacities at both lung bases worrisome for interstitial lung disease. Consider further evaluation with high-resolution chest CT, which may be performed on a short term outpatient basis as clinically warranted. 3. Mild cardiomegaly without overt pulmonary edema. Electronically Signed   By: Delbert Phenix M.D.   On: 10/27/2018 14:53   Ct Chest High Resolution  Result Date: 10/27/2018 CLINICAL DATA:  Dyspnea. COPD. Reticular opacities at the lung bases on chest radiograph. EXAM: CT CHEST WITHOUT CONTRAST TECHNIQUE: Multidetector CT imaging of the chest was performed following the  standard protocol without intravenous contrast. High resolution imaging of the lungs, as well as inspiratory and expiratory imaging, was performed. COMPARISON:  Chest radiograph from earlier today. FINDINGS: Limited motion degraded scan. Cardiovascular: Mild cardiomegaly. No significant pericardial effusion/thickening. Three-vessel coronary atherosclerosis. Atherosclerotic nonaneurysmal thoracic aorta. Borderline prominent main pulmonary artery (3.4 cm diameter). Mediastinum/Nodes: No discrete thyroid nodules. Unremarkable esophagus. No axillary adenopathy. Right paratracheal adenopathy up to 1.7 cm (series 5/image 53). Enlarged 1.3 cm subcarinal node (series 5/image 67). Enlarged 1.1 cm AP window node (series 5/image 49). No discrete hilar adenopathy on this noncontrast scan. Lungs/Pleura: No pneumothorax. No pleural effusion. Moderate to severe centrilobular emphysema with diffuse bronchial wall thickening. There is extensive patchy confluent subpleural reticulation and ground-glass attenuation in both lungs with associated subpleural lines and mild traction bronchiectasis and mild architectural distortion. There is a strong basilar predominance to these findings. No frank honeycombing. No significant lobular air trapping on the expiration sequence. No lung masses or significant pulmonary nodules. Upper abdomen: Cholecystectomy. Simple 3.8 cm anterior interpolar left renal cyst. Musculoskeletal: No aggressive appearing focal osseous lesions. Marked thoracic spondylosis. IMPRESSION: 1. Very limited motion degraded scan. 2. 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 Given the limitations of this scan, suggest attention on a follow-up high-resolution chest CT study in 3 months. Findings are categorized as probable UIP per consensus guidelines: Diagnosis of Idiopathic Pulmonary Fibrosis: An Official ATS/ERS/JRS/ALAT Clinical Practice  Guideline. Am [name] Vol 198, Iss 5, [phone], Apr 22 2017. 3. Three-vessel coronary atherosclerosis. 4. Mild cardiomegaly. 5. Nonspecific mild bilateral mediastinal adenopathy, potentially reactive, which can also be reassessed on short-term follow-up chest CT. Aortic Atherosclerosis (ICD10-I70.0) and Emphysema (ICD10-J43.9). Electronically Signed   By: Delbert Phenix M.D.   On: 10/27/2018 16:08    Time Spent in minutes  30   Susa Raring M.D on 10/29/2018 at 9:27 AM  To page go to [email or web address] - password [facility]

## 2018-10-29 NOTE — Progress Notes (Signed)
Inpatient Diabetes Program Recommendations  AACE/ADA: New Consensus Statement on Inpatient Glycemic Control (2015)  Target Ranges:  Prepandial:   less than 140 mg/dL      Peak postprandial:   less than 180 mg/dL (1-2 hours)      Critically ill patients:  140 - 180 mg/dL   Results for DREWCILLA, IANNI (MRN [id number]) as of 10/29/2018 11:00  Ref. Range 10/28/2018 10:26 10/29/2018 04:17  Glucose Latest Ref Range: 70 - 99 mg/dL 287 (H) 681 (H)   Review of Glycemic Control  Diabetes history: None  Current orders for Inpatient glycemic control: None  Inpatient Diabetes Program Recommendations:    Lab glucose in the 200-300 range most likely due to steroids. Noted steroids not ordered currently for patient. Expect glucose trends to decrease. Consider checking CBGs and Novolog correction.  Thanks,  Christena Deem RN, MSN, BC-ADM Inpatient Diabetes Coordinator Team Pager [phone] (8a-5p)

## 2018-10-29 NOTE — Progress Notes (Signed)
*  PRELIMINARY RESULTS* Echocardiogram 2D Echocardiogram has been performed.  10/29/2018, 8:18 AM

## 2018-10-30 LAB — BASIC METABOLIC PANEL
Anion gap: 11 (ref 5–15)
BUN: 21 mg/dL (ref 8–23)
CO2: 31 mmol/L (ref 22–32)
Calcium: 9 mg/dL (ref 8.9–10.3)
Chloride: 92 mmol/L — ABNORMAL LOW (ref 98–111)
Creatinine, Ser: 1.09 mg/dL — ABNORMAL HIGH (ref 0.44–1.00)
GFR calc non Af Amer: 45 mL/min — ABNORMAL LOW (ref >60)
GFR, EST AFRICAN AMERICAN: 52 mL/min — AB (ref >60)
Glucose, Bld: 164 mg/dL — ABNORMAL HIGH (ref 70–99)
Potassium: 3.9 mmol/L (ref 3.5–5.1)
SODIUM: 134 mmol/L — AB (ref 135–145)

## 2018-10-30 LAB — CBC
HCT: 46.2 % — ABNORMAL HIGH (ref 36.0–46.0)
Hemoglobin: 15.9 g/dL — ABNORMAL HIGH (ref 12.0–15.0)
MCH: 32.8 pg (ref 26.0–34.0)
MCHC: 34.4 g/dL (ref 30.0–36.0)
MCV: 95.3 fL (ref 80.0–100.0)
PLATELETS: 189 10*3/uL (ref 150–400)
RBC: 4.85 MIL/uL (ref 3.87–5.11)
RDW: 13.7 % (ref 11.5–15.5)
WBC: 10.7 10*3/uL — AB (ref 4.0–10.5)
nRBC: 0 % (ref 0.0–0.2)

## 2018-10-30 LAB — MAGNESIUM: MAGNESIUM: 1.7 mg/dL (ref 1.7–2.4)

## 2018-10-30 MED ORDER — MAGNESIUM SULFATE IN D5W 1-5 GM/100ML-% IV SOLN
1.0000 g | Freq: Once | INTRAVENOUS | Status: DC
  Filled 2018-10-30: qty 100

## 2018-10-30 MED ORDER — MAGNESIUM SULFATE IN D5W 1-5 GM/100ML-% IV SOLN
1.0000 g | Freq: Once | INTRAVENOUS | Status: DC
  Administered 2018-10-30: 1 g via INTRAVENOUS
  Filled 2018-10-30: qty 100

## 2018-10-30 MED ORDER — METOLAZONE 2.5 MG PO TABS
2.5000 mg | ORAL_TABLET | Freq: Once | ORAL | Status: AC
  Administered 2018-10-30: 2.5 mg via ORAL
  Filled 2018-10-30: qty 1

## 2018-10-30 MED ORDER — SPIRONOLACTONE 25 MG PO TABS
25.0000 mg | ORAL_TABLET | Freq: Every day | ORAL | Status: DC
  Administered 2018-10-30 – 2018-11-01 (×3): 25 mg via ORAL
  Filled 2018-10-30 (×3): qty 1

## 2018-10-30 NOTE — Care Management Important Message (Signed)
Important Message  Patient Details  Name: [name] MRN: [id number] Date of Birth: [birth year]   Medicare Important Message Given:  Yes    Dorena Bodo 10/30/2018, 4:25 PM

## 2018-10-30 NOTE — Plan of Care (Signed)
Patient is making progress, no respiratory complication, no SOB noted. Respirations even and unlabored. Will continue to monitor

## 2018-10-30 NOTE — Progress Notes (Signed)
PROGRESS NOTE                                                                                                                                                                                                             Patient Demographics:    [name], is a 85 y.o. female, DOB - 16-May-1926, XVQ:[id number]  Admit date - 10/27/2018   Admitting Physician Sharlene Dory, DO  Outpatient Primary MD for the patient is Tally Joe, MD  LOS - 3  Chief Complaint  Patient presents with  . Shortness of Breath       Brief Narrative  [name] is a 85 y.o. female with medical history significant of COPD, HTN, around 1 week ago, the patient had an upper respiratory infection.  Since that time, she has been having worsening shortness of breath, cough and wheezing.  She does have a history of mild COPD and follows with the [facility] pulmonology clinic, further work-up here suggest CHF.   Subjective:    [name] today has, No headache, No chest pain, No abdominal pain - No Nausea, No new weakness tingling or numbness, No Cough - Mild SOB.     Assessment  & Plan :     1.  Acute hypoxic respiratory failure with orthopnea.  Due to acute on chronic diastolic CHF EF is 55%.  Also likely underlying severe pulmonary hypertension.  Do not think this is a COPD exacerbation, also discussed with pulmonary critical care Dr. Molli Knock her CT findings are not consistent with a UIP.  Patient clinically much improved after initiation of IV Lasix and diuresis which will be continued, steroids have been stopped, continue home dose beta-blocker.  Continue salt and fluid restriction monitoring intake output and electrolytes.  Continue down titrating oxygen.  PRN nebulizer treatments.  2.  COPD and questionable UIP on CT scan.  Outpatient pulmonary follow-up follows with [facility] pulmonary clinic.  Case discussed with pulmonologist Dr. Molli Knock on 10/29/2018.  He does not think this is UIP.  3.  Essential  hypertension.  On combination of beta-blocker, calcium channel blocker and ARB ARB dose lowered due to ongoing diuresis.  Continue to monitor.  4.  Hypokalemia.  Replaced and stable.  5.  Severe pulmonary hypertension.  Outpatient pulmonary follow-up.     Family Communication  : None  Code Status :  DNR  Disposition Plan  :  TBD  Consults  :  None  Procedures  :    CT  1. Very limited motion degraded scan. 2. 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 Given the limitations of this scan, suggest attention on a follow-up high-resolution chest CT study in 3 months. Findings are categorized as probable UIP per consensus guidelines: Diagnosis of Idiopathic Pulmonary Fibrosis: An Official ATS/ERS/JRS/ALAT Clinical Practice Guideline. Am Rosezetta Schlatter Crit Care Med Vol 198, Iss 5, [phone], Apr 22 2017. 3. Three-vessel coronary atherosclerosis. 4. Mild cardiomegaly. 5. Nonspecific mild bilateral mediastinal adenopathy, potentially reactive, which can also be reassessed on short-term follow-up chest CT. Aortic Atherosclerosis (ICD10-I70.0) and Emphysema (ICD10-J43.9).  TTE    1. The left ventricle has normal systolic function, with an ejection fraction of 55-60%. The cavity size was normal. There is mildly increased left ventricular wall thickness. Left ventricular diastolic Doppler parameters are consistent with pseudonormalization. No evidence of left ventricular regional wall motion abnormalities.  2. The right ventricle has mildly reduced systolic function. The cavity was mildly enlarged. There is no increase in right ventricular wall thickness.  3. Left atrial size was mildly dilated.  4. Right atrial size was mildly dilated.  5. There is mild mitral annular calcification present. No evidence of mitral valve stenosis.  6. The aortic valve is tricuspid Mild calcification of the aortic valve. no stenosis of the aortic  valve.  7. The aortic root and ascending aorta are normal in size and structure.  8. The inferior vena cava was dilated in size with <50% respiratory variability. PA systolic pressure 75 mmHg.  DVT Prophylaxis  :  Lovenox    Lab Results  Component Value Date   PLT 189 10/30/2018    Diet :  Diet Order            Diet regular Room service appropriate? Yes; Fluid consistency: Thin  Diet effective now               Inpatient Medications Scheduled Meds: . diltiazem  360 mg Oral Daily  . enoxaparin (LOVENOX) injection  40 mg Subcutaneous Q24H  . furosemide  40 mg Intravenous BID  . ipratropium-albuterol  3 mL Nebulization TID  . metoprolol tartrate  12.5 mg Oral Daily  . spironolactone  25 mg Oral Daily   Continuous Infusions: . cefTRIAXone (ROCEPHIN)  IV 1 g (10/29/18 2024)  . magnesium sulfate 1 - 4 g bolus IVPB     PRN Meds:.acetaminophen **OR** [DISCONTINUED] acetaminophen, albuterol, benzonatate, [DISCONTINUED] ondansetron **OR** ondansetron (ZOFRAN) IV, senna-docusate, traZODone  Antibiotics  :   Anti-infectives (From admission, onward)   Start     Dose/Rate Route Frequency Ordered Stop   10/27/18 1915  cefTRIAXone (ROCEPHIN) 1 g in sodium chloride 0.9 % 100 mL IVPB     1 g 200 mL/hr over 30 Minutes Intravenous Every 24 hours 10/27/18 1625 11/01/18 1914          Objective:   Vitals:   10/30/18 0442 10/30/18 0749 10/30/18 0924 10/30/18 0926  BP: (!) 145/76  139/76   Pulse: 72   69  Resp: 15     Temp: 97.6 F (36.4 C)     TempSrc: Oral     SpO2: 94% 90%    Weight:      Height:        Wt Readings from Last 3 Encounters:  10/29/18 94.3 kg     Intake/Output Summary (Last 24 hours) at 10/30/2018 0932 Last data filed at 10/30/2018 0448 Gross per 24 hour  Intake 700 ml  Output 2000  ml  Net -1300 ml     Physical Exam  Awake Alert, Oriented X 3, No new F.N deficits, Normal affect Hendricks.AT,PERRAL Supple Neck,No JVD, No cervical lymphadenopathy  appriciated.  Symmetrical Chest wall movement, Good air movement bilaterally, mild Rales and minimal wheezing RRR,No Gallops, Rubs or new Murmurs, No Parasternal Heave +ve B.Sounds, Abd Soft, No tenderness, No organomegaly appriciated, No rebound - guarding or rigidity. No Cyanosis, Clubbing or edema, No new Rash or bruise    Data Review:    CBC Recent Labs  Lab 10/27/18 1330 10/27/18 1822 10/30/18 0340  WBC 6.8 8.2 10.7*  HGB 16.0* 15.6* 15.9*  HCT 46.9* 44.7 46.2*  PLT 177 184 189  MCV 93.6 92.0 95.3  MCH 31.9 32.1 32.8  MCHC 34.1 34.9 34.4  RDW 13.2 13.1 13.7  LYMPHSABS 0.8  --   --   MONOABS 0.6  --   --   EOSABS 0.0  --   --   BASOSABS 0.0  --   --     Chemistries  Recent Labs  Lab 10/27/18 1330 10/27/18 1822 10/28/18 0709 10/28/18 1026 10/29/18 0417 10/30/18 0340  NA 134*  --   --  132* 132* 134*  K 3.1*  --   --  3.2* 4.0 3.9  CL 96*  --   --  97* 96* 92*  CO2 22  --   --  22 25 31   GLUCOSE 159*  --   --  340* 234* 164*  BUN 16  --   --  14 19 21   CREATININE 0.85 0.82  --  0.86 1.03* 1.09*  CALCIUM 9.5  --   --  9.3 9.1 9.0  MG 1.6*  --  1.8  --  1.9 1.7   ------------------------------------------------------------------------------------------------------------------ No results for input(s): CHOL, HDL, LDLCALC, TRIG, CHOLHDL, LDLDIRECT in the last 72 hours.  No results found for: HGBA1C ------------------------------------------------------------------------------------------------------------------ No results for input(s): TSH, T4TOTAL, T3FREE, THYROIDAB in the last 72 hours.  Invalid input(s): FREET3 ------------------------------------------------------------------------------------------------------------------ No results for input(s): VITAMINB12, FOLATE, FERRITIN, TIBC, IRON, RETICCTPCT in the last 72 hours.  Coagulation profile No results for input(s): INR, PROTIME in the last 168 hours.  No results for input(s): DDIMER in the last 72  hours.  Cardiac Enzymes Recent Labs  Lab 10/27/18 1330  TROPONINI <0.03   ------------------------------------------------------------------------------------------------------------------    Component Value Date/Time   BNP 437.3 (H) 10/28/2018 1026    Micro Results No results found for this or any previous visit (from the past 240 hour(s)).  Radiology Reports Dg Chest 2 View  Result Date: 10/29/2018 CLINICAL DATA:  Shortness of breath EXAM: CHEST - 2 VIEW COMPARISON:  Chest CT 10/27/2018 FINDINGS: Right basilar opacities, likely corresponding to interstitial disease identified on recent chest CT. No focal consolidation. No pulmonary edema. No pleural effusion or pneumothorax. Mild cardiomegaly. IMPRESSION: Mild cardiomegaly with right basilar opacities that likely correspond to chronic interstitial disease. Electronically Signed   By: Deatra Robinson M.D.   On: 10/29/2018 16:18   Dg Chest 2 View  Result Date: 10/27/2018 CLINICAL DATA:  Dyspnea EXAM: CHEST - 2 VIEW COMPARISON:  07/09/2004 chest radiograph. FINDINGS: Stable cardiomediastinal silhouette with mild cardiomegaly. No pneumothorax. No pleural effusion. No overt pulmonary edema. Hazy reticular opacities at both lung bases. Hyperinflated lungs. IMPRESSION: 1. Hyperinflated lungs, suggesting COPD. 2. Hazy reticular opacities at both lung bases worrisome for interstitial lung disease. Consider further evaluation with high-resolution chest CT, which may be performed on a short term outpatient basis  as clinically warranted. 3. Mild cardiomegaly without overt pulmonary edema. Electronically Signed   By: Delbert Phenix M.D.   On: 10/27/2018 14:53   Ct Chest High Resolution  Result Date: 10/27/2018 CLINICAL DATA:  Dyspnea. COPD. Reticular opacities at the lung bases on chest radiograph. EXAM: CT CHEST WITHOUT CONTRAST TECHNIQUE: Multidetector CT imaging of the chest was performed following the standard protocol without intravenous contrast.  High resolution imaging of the lungs, as well as inspiratory and expiratory imaging, was performed. COMPARISON:  Chest radiograph from earlier today. FINDINGS: Limited motion degraded scan. Cardiovascular: Mild cardiomegaly. No significant pericardial effusion/thickening. Three-vessel coronary atherosclerosis. Atherosclerotic nonaneurysmal thoracic aorta. Borderline prominent main pulmonary artery (3.4 cm diameter). Mediastinum/Nodes: No discrete thyroid nodules. Unremarkable esophagus. No axillary adenopathy. Right paratracheal adenopathy up to 1.7 cm (series 5/image 53). Enlarged 1.3 cm subcarinal node (series 5/image 67). Enlarged 1.1 cm AP window node (series 5/image 49). No discrete hilar adenopathy on this noncontrast scan. Lungs/Pleura: No pneumothorax. No pleural effusion. Moderate to severe centrilobular emphysema with diffuse bronchial wall thickening. There is extensive patchy confluent subpleural reticulation and ground-glass attenuation in both lungs with associated subpleural lines and mild traction bronchiectasis and mild architectural distortion. There is a strong basilar predominance to these findings. No frank honeycombing. No significant lobular air trapping on the expiration sequence. No lung masses or significant pulmonary nodules. Upper abdomen: Cholecystectomy. Simple 3.8 cm anterior interpolar left renal cyst. Musculoskeletal: No aggressive appearing focal osseous lesions. Marked thoracic spondylosis. IMPRESSION: 1. Very limited motion degraded scan. 2. 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 Given the limitations of this scan, suggest attention on a follow-up high-resolution chest CT study in 3 months. Findings are categorized as probable UIP per consensus guidelines: Diagnosis of Idiopathic Pulmonary Fibrosis: An Official ATS/ERS/JRS/ALAT Clinical Practice Guideline. Am Rosezetta Schlatter Crit Care Med Vol 198, Iss 5,  [phone], Apr 22 2017. 3. Three-vessel coronary atherosclerosis. 4. Mild cardiomegaly. 5. Nonspecific mild bilateral mediastinal adenopathy, potentially reactive, which can also be reassessed on short-term follow-up chest CT. Aortic Atherosclerosis (ICD10-I70.0) and Emphysema (ICD10-J43.9). Electronically Signed   By: Delbert Phenix M.D.   On: 10/27/2018 16:08    Time Spent in minutes  30   Susa Raring M.D on 10/30/2018 at 9:32 AM  To page go to [email or web address] - password [facility]

## 2018-10-31 DIAGNOSIS — I5033 Acute on chronic diastolic (congestive) heart failure: Secondary | ICD-10-CM

## 2018-10-31 DIAGNOSIS — I10 Essential (primary) hypertension: Secondary | ICD-10-CM

## 2018-10-31 DIAGNOSIS — J9601 Acute respiratory failure with hypoxia: Secondary | ICD-10-CM

## 2018-10-31 LAB — BASIC METABOLIC PANEL
Anion gap: 9 (ref 5–15)
BUN: 28 mg/dL — ABNORMAL HIGH (ref 8–23)
CO2: 34 mmol/L — ABNORMAL HIGH (ref 22–32)
Calcium: 9.3 mg/dL (ref 8.9–10.3)
Chloride: 90 mmol/L — ABNORMAL LOW (ref 98–111)
Creatinine, Ser: 1.07 mg/dL — ABNORMAL HIGH (ref 0.44–1.00)
GFR calc Af Amer: 53 mL/min — ABNORMAL LOW (ref >60)
GFR calc non Af Amer: 46 mL/min — ABNORMAL LOW (ref >60)
GLUCOSE: 169 mg/dL — AB (ref 70–99)
Potassium: 3.4 mmol/L — ABNORMAL LOW (ref 3.5–5.1)
Sodium: 133 mmol/L — ABNORMAL LOW (ref 135–145)

## 2018-10-31 LAB — GLUCOSE, CAPILLARY: Glucose-Capillary: 222 mg/dL — ABNORMAL HIGH (ref 70–99)

## 2018-10-31 LAB — MAGNESIUM: Magnesium: 1.8 mg/dL (ref 1.7–2.4)

## 2018-10-31 MED ORDER — POTASSIUM CHLORIDE CRYS ER 20 MEQ PO TBCR
40.0000 meq | EXTENDED_RELEASE_TABLET | Freq: Once | ORAL | Status: AC
  Administered 2018-10-31: 40 meq via ORAL
  Filled 2018-10-31: qty 2

## 2018-10-31 NOTE — Progress Notes (Signed)
Patient ID: [name], female   DOB: Apr 05, 1926, 85 y.o.   MRN: [id number]  PROGRESS NOTE    [name]  PPI:[id number] DOB: 07/09/1926 DOA: 10/27/2018 PCP: Tally Joe, MD   Brief Narrative:  85 year old female with history of COPD, hypertension, recent upper respiratory infection presented with worsening shortness of breath, cough and wheezing.  She was admitted with acute hypoxic respiratory failure secondary to CHF exacerbation.  Assessment & Plan:   Principal Problem:   COPD exacerbation (HCC) Active Problems:   Essential hypertension   Acute respiratory failure (HCC)   Hypomagnesemia   Hypokalemia  Acute hypoxic respiratory failure -Probably from CHF exacerbation -Currently on 3 L oxygen via nasal cannula.  Wean off as able.  Might need home oxygen -Incentive spirometry  Acute on chronic diastolic CHF -EF of 55% with severe pulmonary hypertension -Currently on IV Lasix 40 mg every 12 hours with good diuresis.  Negative balance of 6529.3 cc since admission.  Continue IV Lasix till today and probably switch to oral Lasix tomorrow.  Continue metoprolol and spironolactone.  Outpatient follow-up with cardiology.  Strict input output.  Daily weights.  Fluid restriction.  COPD and questionable UIP on CT scan -Case was discussed with Dr. Janalyn Shy on 10/29/2018 on phone and as per him CT findings are not consistent with UIP. -It does not seem to be COPD exacerbation as symptoms improved with diuresis.  Steroids have been stopped. -Continue PRN nebulizer treatments.  Will add Dulera.  Outpatient follow-up with pulmonary -DC antibiotics  Essential hypertension -Blood pressure stable.  Continue Cardizem, metoprolol, spironolactone and Lasix  Hypokalemia Resolved  Severe pulmonary hypertension -Outpatient follow-up with pulmonary   DVT prophylaxis: Lovenox Code Status: DNR Family Communication: None at bedside Disposition Plan: Home tomorrow if clinically stable and  keeps improving  Consultants: None  Procedures:  TTE   1. The left ventricle has normal systolic function, with an ejection fraction of 55-60%. The cavity size was normal. There is mildly increased left ventricular wall thickness. Left ventricular diastolic Doppler parameters are consistent with pseudonormalization. No evidence of left ventricular regional wall motion abnormalities. 2. The right ventricle has mildly reduced systolic function. The cavity was mildly enlarged. There is no increase in right ventricular wall thickness. 3. Left atrial size was mildly dilated. 4. Right atrial size was mildly dilated. 5. There is mild mitral annular calcification present. No evidence of mitral valve stenosis. 6. The aortic valve is tricuspid Mild calcification of the aortic valve. no stenosis of the aortic valve. 7. The aortic root and ascending aorta are normal in size and structure. 8. The inferior vena cava was dilated in size with <50% respiratory variability. PA systolic pressure 75 mmHg.   Antimicrobials:  Anti-infectives (From admission, onward)   Start     Dose/Rate Route Frequency Ordered Stop   10/27/18 1915  cefTRIAXone (ROCEPHIN) 1 g in sodium chloride 0.9 % 100 mL IVPB  Status:  Discontinued     1 g 200 mL/hr over 30 Minutes Intravenous Every 24 hours 10/27/18 1625 10/31/18 0803        Subjective: Patient seen and examined at bedside.  She is still short of breath but feels slightly better.  No worsening fever, nausea or vomiting.  Objective: Vitals:   10/31/18 0502 10/31/18 0826 10/31/18 0827 10/31/18 0838  BP: 134/79 118/63    Pulse: 66  71   Resp: 15     Temp: (!) 97.3 F (36.3 C)     TempSrc: Oral  SpO2: 94% 92%  (!) 89%  Weight:      Height:        Intake/Output Summary (Last 24 hours) at 10/31/2018 1131 Last data filed at 10/31/2018 0503 Gross per 24 hour  Intake 100 ml  Output 2800 ml  Net -2700 ml   Filed Weights   10/29/18 0623  Weight:  94.3 kg    Examination:  General exam: Appears calm and comfortable.  Elderly female lying in bed. Respiratory system: Bilateral decreased breath sounds at bases with scattered basilar crackles Cardiovascular system: S1 & S2 heard, Rate controlled Gastrointestinal system: Abdomen is nondistended, soft and nontender. Normal bowel sounds heard. Extremities: No cyanosis, clubbing; trace lower extremity edema     Data Reviewed: I have personally reviewed following labs and imaging studies  CBC: Recent Labs  Lab 10/27/18 1330 10/27/18 1822 10/30/18 0340  WBC 6.8 8.2 10.7*  NEUTROABS 5.3  --   --   HGB 16.0* 15.6* 15.9*  HCT 46.9* 44.7 46.2*  MCV 93.6 92.0 95.3  PLT 177 184 189   Basic Metabolic Panel: Recent Labs  Lab 10/27/18 1330 10/27/18 1822 10/28/18 0709 10/28/18 1026 10/29/18 0417 10/30/18 0340 10/31/18 0323  NA 134*  --   --  132* 132* 134* 133*  K 3.1*  --   --  3.2* 4.0 3.9 3.4*  CL 96*  --   --  97* 96* 92* 90*  CO2 22  --   --  22 25 31  34*  GLUCOSE 159*  --   --  340* 234* 164* 169*  BUN 16  --   --  14 19 21  28*  CREATININE 0.85 0.82  --  0.86 1.03* 1.09* 1.07*  CALCIUM 9.5  --   --  9.3 9.1 9.0 9.3  MG 1.6*  --  1.8  --  1.9 1.7 1.8   GFR: Estimated Creatinine Clearance: 38.2 mL/min (A) (by C-G formula based on SCr of 1.07 mg/dL (H)). Liver Function Tests: No results for input(s): AST, ALT, ALKPHOS, BILITOT, PROT, ALBUMIN in the last 168 hours. No results for input(s): LIPASE, AMYLASE in the last 168 hours. No results for input(s): AMMONIA in the last 168 hours. Coagulation Profile: No results for input(s): INR, PROTIME in the last 168 hours. Cardiac Enzymes: Recent Labs  Lab 10/27/18 1330  TROPONINI <0.03   BNP (last 3 results) No results for input(s): PROBNP in the last 8760 hours. HbA1C: No results for input(s): HGBA1C in the last 72 hours. CBG: No results for input(s): GLUCAP in the last 168 hours. Lipid Profile: No results for  input(s): CHOL, HDL, LDLCALC, TRIG, CHOLHDL, LDLDIRECT in the last 72 hours. Thyroid Function Tests: No results for input(s): TSH, T4TOTAL, FREET4, T3FREE, THYROIDAB in the last 72 hours. Anemia Panel: No results for input(s): VITAMINB12, FOLATE, FERRITIN, TIBC, IRON, RETICCTPCT in the last 72 hours. Sepsis Labs: No results for input(s): PROCALCITON, LATICACIDVEN in the last 168 hours.  No results found for this or any previous visit (from the past 240 hour(s)).       Radiology Studies: Dg Chest 2 View  Result Date: 10/29/2018 CLINICAL DATA:  Shortness of breath EXAM: CHEST - 2 VIEW COMPARISON:  Chest CT 10/27/2018 FINDINGS: Right basilar opacities, likely corresponding to interstitial disease identified on recent chest CT. No focal consolidation. No pulmonary edema. No pleural effusion or pneumothorax. Mild cardiomegaly. IMPRESSION: Mild cardiomegaly with right basilar opacities that likely correspond to chronic interstitial disease. Electronically Signed   By: Caryn Bee  Chase Picket M.D.   On: 10/29/2018 16:18        Scheduled Meds: . diltiazem  360 mg Oral Daily  . enoxaparin (LOVENOX) injection  40 mg Subcutaneous Q24H  . furosemide  40 mg Intravenous BID  . ipratropium-albuterol  3 mL Nebulization TID  . metoprolol tartrate  12.5 mg Oral Daily  . spironolactone  25 mg Oral Daily   Continuous Infusions:   LOS: 4 days        Glade Lloyd, MD [facility] 10/31/2018, 11:31 AM

## 2018-10-31 NOTE — Progress Notes (Signed)
Physical Therapy Note  SATURATION QUALIFICATIONS: (This note is used to comply with regulatory documentation for home oxygen)  Patient Saturations on Room Air at Rest = 85%  Patient Saturations on Room Air while Ambulating = 80%  Patient Saturations on 4 Liters of oxygen while Ambulating = 89-90%  Please briefly explain why patient needs home oxygen: Patient requires supplemental oxygen to maintain oxygen saturations at acceptable, safe levels with physical activity.   Full PT eval note to follow;   Van Clines, PT  Acute Rehabilitation Services Pager [phone] Office [phone]

## 2018-10-31 NOTE — Evaluation (Signed)
Physical Therapy Evaluation Patient Details Name: [name] MRN: [id number] DOB: May 27, 1926 Today's Date: 10/31/2018   History of Present Illness  85 year old female with history of COPD, hypertension, recent upper respiratory infection presented with worsening shortness of breath, cough and wheezing.  She was admitted with acute hypoxic respiratory failure secondary to CHF exacerbation.  Clinical Impression   Pt admitted with above diagnosis. Pt currently with functional limitations due to the deficits listed below (see PT Problem List). Very independent prior to this admission; Presents with decr activity tolerance, and some unsteadiness especially with turns; Significant O2 sat drop with activity -- see other note of this date;  Pt will benefit from skilled PT to increase their independence and safety with mobility to allow discharge to the venue listed below.       Follow Up Recommendations Other (comment)(Cardiac Rehab Phase 2)    Equipment Recommendations  Other (comment)(Supplemental O2)    Recommendations for Other Services       Precautions / Restrictions Precautions Precautions: Other (comment) Precaution Comments: watch O2 sats      Mobility  Bed Mobility                  Transfers Overall transfer level: Needs assistance Equipment used: None Transfers: Sit to/from Stand Sit to Stand: Supervision         General transfer comment: Stood without physical assist; noted dependent on UEs to push   Ambulation/Gait Ambulation/Gait assistance: Min guard Gait Distance (Feet): 200 Feet Assistive device: Rolling walker (2 wheeled) Gait Pattern/deviations: Step-through pattern Gait velocity: quick   General Gait Details: Walks with a fast pace; tends to get outside of RW with turns; cues for safe RW use and to self-monitor for activity tolerance  Stairs            Wheelchair Mobility    Modified Rankin (Stroke Patients Only)       Balance  Overall balance assessment: Mild deficits observed, not formally tested                                           Pertinent Vitals/Pain Pain Assessment: No/denies pain    Home Living Family/patient expects to be discharged to:: Private residence Living Arrangements: Spouse/significant other Available Help at Discharge: Family;Available PRN/intermittently Type of Home: House Home Access: Stairs to enter Entrance Stairs-Rails: Right;Left;Can reach both Entrance Stairs-Number of Steps: 5 Home Layout: One level Home Equipment: Environmental consultant - 2 wheels      Prior Function Level of Independence: Independent         Comments: Drives, shops, goes to gym     Hand Dominance        Extremity/Trunk Assessment   Upper Extremity Assessment Upper Extremity Assessment: Overall WFL for tasks assessed(for simple mobility)    Lower Extremity Assessment Lower Extremity Assessment: Generalized weakness       Communication   Communication: No difficulties  Cognition Arousal/Alertness: Awake/alert Behavior During Therapy: WFL for tasks assessed/performed(mildly impulsive) Overall Cognitive Status: Within Functional Limits for tasks assessed                                        General Comments General comments (skin integrity, edema, etc.): See other note of this date for O2 sat walk    Exercises  Assessment/Plan    PT Assessment Patient needs continued PT services  PT Problem List Decreased strength;Decreased activity tolerance;Decreased mobility;Decreased knowledge of use of DME;Decreased knowledge of precautions;Cardiopulmonary status limiting activity       PT Treatment Interventions DME instruction;Gait training;Stair training;Functional mobility training;Therapeutic activities;Therapeutic exercise;Patient/family education    PT Goals (Current goals can be found in the Care Plan section)  Acute Rehab PT Goals Patient Stated Goal: Get  home soon PT Goal Formulation: With patient Time For Goal Achievement: 11/14/18 Potential to Achieve Goals: Good    Frequency Min 3X/week   Barriers to discharge        Co-evaluation               AM-PAC PT "6 Clicks" Mobility  Outcome Measure Help needed turning from your back to your side while in a flat bed without using bedrails?: None Help needed moving from lying on your back to sitting on the side of a flat bed without using bedrails?: None Help needed moving to and from a bed to a chair (including a wheelchair)?: None Help needed standing up from a chair using your arms (e.g., wheelchair or bedside chair)?: A Little Help needed to walk in hospital room?: A Little Help needed climbing 3-5 steps with a railing? : A Little 6 Click Score: 21    End of Session Equipment Utilized During Treatment: Gait belt;Oxygen Activity Tolerance: Patient tolerated treatment well Patient left: in chair;with call bell/phone within reach Nurse Communication: Mobility status;Other (comment)(need for supplemental O2) PT Visit Diagnosis: Unsteadiness on feet (R26.81);Other (comment)(decr functioanl capacity)    Time: 1422-1450 PT Time Calculation (min) (ACUTE ONLY): 28 min   Charges:   PT Evaluation $PT Eval Moderate Complexity: 1 Mod PT Treatments $Gait Training: 8-22 mins        Van Clines, PT  Acute Rehabilitation Services Pager [phone] Office [phone]   [name] 10/31/2018, 4:02 PM

## 2018-11-01 LAB — BASIC METABOLIC PANEL
Anion gap: 11 (ref 5–15)
BUN: 29 mg/dL — ABNORMAL HIGH (ref 8–23)
CHLORIDE: 90 mmol/L — AB (ref 98–111)
CO2: 31 mmol/L (ref 22–32)
Calcium: 9.1 mg/dL (ref 8.9–10.3)
Creatinine, Ser: 1.12 mg/dL — ABNORMAL HIGH (ref 0.44–1.00)
GFR calc Af Amer: 50 mL/min — ABNORMAL LOW (ref >60)
GFR calc non Af Amer: 43 mL/min — ABNORMAL LOW (ref >60)
GLUCOSE: 162 mg/dL — AB (ref 70–99)
Potassium: 3.7 mmol/L (ref 3.5–5.1)
Sodium: 132 mmol/L — ABNORMAL LOW (ref 135–145)

## 2018-11-01 MED ORDER — BENZONATATE 200 MG PO CAPS: 200.0000 mg | ORAL_CAPSULE | Freq: Three times a day (TID) | ORAL | 0 refills | Status: DC | PRN

## 2018-11-01 MED ORDER — SPIRONOLACTONE 25 MG PO TABS: 25.0000 mg | ORAL_TABLET | Freq: Every day | ORAL | 0 refills | Status: DC

## 2018-11-01 MED ORDER — MOMETASONE FURO-FORMOTEROL FUM 100-5 MCG/ACT IN AERO: 2.0000 | INHALATION_SPRAY | Freq: Two times a day (BID) | RESPIRATORY_TRACT | 0 refills | Status: DC

## 2018-11-01 MED ORDER — ALBUTEROL SULFATE HFA 108 (90 BASE) MCG/ACT IN AERS: 2.0000 | INHALATION_SPRAY | Freq: Four times a day (QID) | RESPIRATORY_TRACT | 0 refills | Status: DC | PRN

## 2018-11-01 MED ORDER — FUROSEMIDE 40 MG PO TABS: 40.0000 mg | ORAL_TABLET | Freq: Two times a day (BID) | ORAL | 0 refills | Status: DC

## 2018-11-01 MED ORDER — MOMETASONE FURO-FORMOTEROL FUM 100-5 MCG/ACT IN AERO
2.0000 | INHALATION_SPRAY | Freq: Two times a day (BID) | RESPIRATORY_TRACT | Status: DC
  Filled 2018-11-01: qty 8.8

## 2018-11-01 NOTE — Care Management Note (Addendum)
Case Management Note  Patient Details  Name: [name] MRN: [id number] Date of Birth: [birth year]  Subjective/Objective:        COPD exacerbation. Hx of  COPD, hypertension. From home with family. PTA indepent with ADL'S , no DME usage.       Ariyonna Arriola (Spouse) Lali Pollins (Daughter)    [phone]     PCP: Tally Joe  Action/Plan: Transition to home with oxygen .Choice provided to pt. Pt selected [facility]/ [facility]. Referral made with [facility] for home oxygen. Portable tank will be delivered to bedside prior to d/c, pending authorization. Pt states PCP manages COPD and will f/u with MD once d/c.  Pt states daughter to provide transportation to home.  Expected Discharge Date:  11/01/18               Expected Discharge Plan:  Home/Self Care  In-House Referral:  NA  Discharge planning Services  CM Consult  Post Acute Care Choice:  NA Choice offered to:  Patient  DME Arranged:  Oxygen DME Agency:  [facility]  HH Arranged:  NA HH Agency:  NA  Status of Service:  Completed, signed off  If discussed at Long Length of Stay Meetings, dates discussed:    Additional Comments:  Epifanio Lesches, RN 11/01/2018, 1:27 PM

## 2018-11-01 NOTE — Discharge Summary (Signed)
Physician Discharge Summary  QUETZALY TOSTE YCX:[id number] DOB: Mar 23, 1926 DOA: 10/27/2018  PCP: Tally Joe, MD  Admit date: 10/27/2018 Discharge date: 11/01/2018  Admitted From: Home Disposition: Home  Recommendations for Outpatient Follow-up:  1. Follow up with PCP in 1 week with repeat CBC/BMP 2. Outpatient follow-up with pulmonary and cardiology 3. Follow up in ED if symptoms worsen or new appear   Home Health: Home health PT/RN Equipment/Devices: Oxygen via nasal cannula at 4 L/min  Discharge Condition: Guarded CODE STATUS: DNR Diet recommendation: Heart healthy/fluid restriction of up to 1500 cc a day  Brief/Interim Summary: 85 year old female with history of COPD, hypertension, recent upper respiratory infection presented with worsening shortness of breath, cough and wheezing.  She was admitted with acute hypoxic respiratory failure secondary to CHF exacerbation.  She was treated with IV Lasix and has diuresed a lot.  She feels a little better.  She wants to go home.  She will have to be discharged on oral Lasix along with oxygen via nasal cannula.   Discharge Diagnoses:  Principal Problem:   COPD exacerbation (HCC) Active Problems:   Essential hypertension   Acute respiratory failure (HCC)   Hypomagnesemia   Hypokalemia  Acute hypoxic respiratory failure -Probably from CHF exacerbation -Currently on 3 L oxygen via nasal cannula.    Oxygen saturation drops down to the 80s with ambulation.  She will need to be discharged on oxygen via nasal cannula at 4 L/min for diagnosis of hypoxia. -Will benefit from outpatient pulmonary evaluation.  Acute on chronic diastolic CHF -EF of 55% with severe pulmonary hypertension -Treated with IV Lasix 40 mg every 12 hours with good diuresis.  Negative balance of 7439 .3 cc since admission.  Patient was to go home today.  We will switch to Lasix 40 mg twice a day. Continue metoprolol and spironolactone.  Outpatient follow-up with  cardiology.    COPD and questionable UIP on CT scan -Case was discussed with Dr. Janalyn Shy on 10/29/2018 on phone and as per him CT findings are not consistent with UIP. -It does not seem to be COPD exacerbation as symptoms improved with diuresis.  Steroids have been stopped. -Outpatient follow-up with pulmonary.  We will add Dulera to the regimen.  Essential hypertension -Blood pressure stable.  Continue Cardizem, metoprolol, spironolactone and Lasix  Hypokalemia Resolved  Severe pulmonary hypertension -Outpatient follow-up with pulmonary  Discharge Instructions  Discharge Instructions    (HEART FAILURE PATIENTS) Call MD:  Anytime you have any of the following symptoms: 1) 3 pound weight gain in 24 hours or 5 pounds in 1 week 2) shortness of breath, with or without a dry hacking cough 3) swelling in the hands, feet or stomach 4) if you have to sleep on extra pillows at night in order to breathe.   Complete by:  As directed    Ambulatory referral to Cardiology   Complete by:  As directed    CHF exacerbation   Ambulatory referral to Pulmonology   Complete by:  As directed    Hypoxia   Call MD for:  difficulty breathing, headache or visual disturbances   Complete by:  As directed    Call MD for:  extreme fatigue   Complete by:  As directed    Call MD for:  persistant dizziness or light-headedness   Complete by:  As directed    Diet - low sodium heart healthy   Complete by:  As directed    Increase activity slowly   Complete by:  As directed      Allergies as of 11/01/2018      Reactions   Bisoprolol-hydrochlorothiazide Other (See Comments)   Other reaction(s): Cough (ALLERGY/intolerance)   Meperidine Nausea And Vomiting   Tetracycline Hives      Medication List    STOP taking these medications   hydrochlorothiazide 25 MG tablet Commonly known as:  HYDRODIURIL   olmesartan 40 MG tablet Commonly known as:  BENICAR     TAKE these medications   albuterol 108  (90 Base) MCG/ACT inhaler Commonly known as:  PROVENTIL HFA;VENTOLIN HFA Inhale 2 puffs into the lungs every 6 (six) hours as needed for wheezing or shortness of breath.   benzonatate 200 MG capsule Commonly known as:  TESSALON Take 1 capsule (200 mg total) by mouth 3 (three) times daily as needed for cough.   diltiazem 360 MG 24 hr tablet Commonly known as:  CARDIZEM LA Take 360 mg by mouth daily.   furosemide 40 MG tablet Commonly known as:  Lasix Take 1 tablet (40 mg total) by mouth 2 (two) times daily for 30 days.   metoprolol tartrate 25 MG tablet Commonly known as:  LOPRESSOR Take 12.5 mg by mouth daily.   mometasone-formoterol 100-5 MCG/ACT Aero Commonly known as:  DULERA Inhale 2 puffs into the lungs 2 (two) times daily.   spironolactone 25 MG tablet Commonly known as:  ALDACTONE Take 1 tablet (25 mg total) by mouth daily. Start taking on:  November 02, 2018            Durable Medical Equipment  (From admission, onward)         Start     Ordered   11/01/18 1254  DME Oxygen  Once    Question Answer Comment  Mode or (Route) Nasal cannula   Liters per Minute 4   Frequency Continuous (stationary and portable oxygen unit needed)   Oxygen conserving device Yes   Oxygen delivery system Gas      11/01/18 1254         Follow-up Information    Tally Joe, MD. Schedule an appointment as soon as possible for a visit in 1 week(s).   Specialty:  Family Medicine Why:  With repeat CBC/BMP Contact information: [address] [place] [address] [phone]          Allergies  Allergen Reactions  . Bisoprolol-Hydrochlorothiazide Other (See Comments)    Other reaction(s): Cough (ALLERGY/intolerance)  . Meperidine Nausea And Vomiting  . Tetracycline Hives    Consultations:  None   Procedures/Studies: Dg Chest 2 View  Result Date: 10/29/2018 CLINICAL DATA:  Shortness of breath EXAM: CHEST - 2 VIEW COMPARISON:  Chest CT 10/27/2018  FINDINGS: Right basilar opacities, likely corresponding to interstitial disease identified on recent chest CT. No focal consolidation. No pulmonary edema. No pleural effusion or pneumothorax. Mild cardiomegaly. IMPRESSION: Mild cardiomegaly with right basilar opacities that likely correspond to chronic interstitial disease. Electronically Signed   By: Deatra Robinson M.D.   On: 10/29/2018 16:18   Dg Chest 2 View  Result Date: 10/27/2018 CLINICAL DATA:  Dyspnea EXAM: CHEST - 2 VIEW COMPARISON:  07/09/2004 chest radiograph. FINDINGS: Stable cardiomediastinal silhouette with mild cardiomegaly. No pneumothorax. No pleural effusion. No overt pulmonary edema. Hazy reticular opacities at both lung bases. Hyperinflated lungs. IMPRESSION: 1. Hyperinflated lungs, suggesting COPD. 2. Hazy reticular opacities at both lung bases worrisome for interstitial lung disease. Consider further evaluation with high-resolution chest CT, which may be performed on  a short term outpatient basis as clinically warranted. 3. Mild cardiomegaly without overt pulmonary edema. Electronically Signed   By: Delbert PhenixJason A Poff M.D.   On: 10/27/2018 14:53   Ct Chest High Resolution  Result Date: 10/27/2018 CLINICAL DATA:  Dyspnea. COPD. Reticular opacities at the lung bases on chest radiograph. EXAM: CT CHEST WITHOUT CONTRAST TECHNIQUE: Multidetector CT imaging of the chest was performed following the standard protocol without intravenous contrast. High resolution imaging of the lungs, as well as inspiratory and expiratory imaging, was performed. COMPARISON:  Chest radiograph from earlier today. FINDINGS: Limited motion degraded scan. Cardiovascular: Mild cardiomegaly. No significant pericardial effusion/thickening. Three-vessel coronary atherosclerosis. Atherosclerotic nonaneurysmal thoracic aorta. Borderline prominent main pulmonary artery (3.4 cm diameter). Mediastinum/Nodes: No discrete thyroid nodules. Unremarkable esophagus. No axillary adenopathy.  Right paratracheal adenopathy up to 1.7 cm (series 5/image 53). Enlarged 1.3 cm subcarinal node (series 5/image 67). Enlarged 1.1 cm AP window node (series 5/image 49). No discrete hilar adenopathy on this noncontrast scan. Lungs/Pleura: No pneumothorax. No pleural effusion. Moderate to severe centrilobular emphysema with diffuse bronchial wall thickening. There is extensive patchy confluent subpleural reticulation and ground-glass attenuation in both lungs with associated subpleural lines and mild traction bronchiectasis and mild architectural distortion. There is a strong basilar predominance to these findings. No frank honeycombing. No significant lobular air trapping on the expiration sequence. No lung masses or significant pulmonary nodules. Upper abdomen: Cholecystectomy. Simple 3.8 cm anterior interpolar left renal cyst. Musculoskeletal: No aggressive appearing focal osseous lesions. Marked thoracic spondylosis. IMPRESSION: 1. Very limited motion degraded scan. 2. 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 Given the limitations of this scan, suggest attention on a follow-up high-resolution chest CT study in 3 months. Findings are categorized as probable UIP per consensus guidelines: Diagnosis of Idiopathic Pulmonary Fibrosis: An Official ATS/ERS/JRS/ALAT Clinical Practice Guideline. Am Rosezetta SchlatterJ Respir Crit Care Med Vol 198, Iss 5, [id number], Apr 22 2017. 3. Three-vessel coronary atherosclerosis. 4. Mild cardiomegaly. 5. Nonspecific mild bilateral mediastinal adenopathy, potentially reactive, which can also be reassessed on short-term follow-up chest CT. Aortic Atherosclerosis (ICD10-I70.0) and Emphysema (ICD10-J43.9). Electronically Signed   By: Delbert PhenixJason A Poff M.D.   On: 10/27/2018 16:08       Subjective: Patient seen and examined at bedside.  She feels slightly better and wants to go home today.  Still requiring oxygen.  Is ambulating  okay as per her.  No overnight fever, worsening shortness of breath or chest pain.  Discharge Exam: Vitals:   11/01/18 0859 11/01/18 0922  BP: 116/65   Pulse: 79   Resp:    Temp:    SpO2:  94%   Vitals:   10/31/18 2016 11/01/18 0450 11/01/18 0859 11/01/18 0922  BP: 120/66 129/70 116/65   Pulse: 64 66 79   Resp: 18 16    Temp: 98 F (36.7 C) (!) 97.5 F (36.4 C)    TempSrc: Oral Oral    SpO2: 93% 93%  94%  Weight:      Height:        General: Pt is alert, awake, not in acute distress Cardiovascular: rate controlled, S1/S2 + Respiratory: bilateral decreased breath sounds at bases with basilar crackles.  No wheezing Abdominal: Soft, NT, ND, bowel sounds + Extremities: Trace edema, no cyanosis    The results of significant diagnostics from this hospitalization (including imaging, microbiology, ancillary and laboratory) are listed below for reference.     Microbiology: No results found for this or any previous  visit (from the past 240 hour(s)).   Labs: BNP (last 3 results) Recent Labs    10/27/18 1330 10/28/18 1026  BNP 211.9* 437.3*   Basic Metabolic Panel: Recent Labs  Lab 10/27/18 1330  10/28/18 0709 10/28/18 1026 10/29/18 0417 10/30/18 0340 10/31/18 0323 11/01/18 0334  NA 134*  --   --  132* 132* 134* 133* 132*  K 3.1*  --   --  3.2* 4.0 3.9 3.4* 3.7  CL 96*  --   --  97* 96* 92* 90* 90*  CO2 22  --   --  34* 31  GLUCOSE 159*  --   --  340* 234* 164* 169* 162*  BUN 16  --   --  28* 29*  CREATININE 0.85   < >  --  0.86 1.03* 1.09* 1.07* 1.12*  CALCIUM 9.5  --   --  9.3 9.1 9.0 9.3 9.1  MG 1.6*  --  1.8  --  1.9 1.7 1.8  --    < > = values in this interval not displayed.   Liver Function Tests: No results for input(s): AST, ALT, ALKPHOS, BILITOT, PROT, ALBUMIN in the last 168 hours. No results for input(s): LIPASE, AMYLASE in the last 168 hours. No results for input(s): AMMONIA in the last 168 hours. CBC: Recent Labs  Lab  10/27/18 1330 10/27/18 1822 10/30/18 0340  WBC 6.8 8.2 10.7*  NEUTROABS 5.3  --   --   HGB 16.0* 15.6* 15.9*  HCT 46.9* 44.7 46.2*  MCV 93.6 92.0 95.3  PLT 177 184 189   Cardiac Enzymes: Recent Labs  Lab 10/27/18 1330  TROPONINI <0.03   BNP: Invalid input(s): POCBNP CBG: Recent Labs  Lab 10/31/18 2109  GLUCAP 222*   D-Dimer No results for input(s): DDIMER in the last 72 hours. Hgb A1c No results for input(s): HGBA1C in the last 72 hours. Lipid Profile No results for input(s): CHOL, HDL, LDLCALC, TRIG, CHOLHDL, LDLDIRECT in the last 72 hours. Thyroid function studies No results for input(s): TSH, T4TOTAL, T3FREE, THYROIDAB in the last 72 hours.  Invalid input(s): FREET3 Anemia work up No results for input(s): VITAMINB12, FOLATE, FERRITIN, TIBC, IRON, RETICCTPCT in the last 72 hours. Urinalysis No results found for: COLORURINE, APPEARANCEUR, LABSPEC, PHURINE, GLUCOSEU, HGBUR, BILIRUBINUR, KETONESUR, PROTEINUR, UROBILINOGEN, NITRITE, LEUKOCYTESUR Sepsis Labs Invalid input(s): PROCALCITONIN,  WBC,  LACTICIDVEN Microbiology No results found for this or any previous visit (from the past 240 hour(s)).   Time coordinating discharge: 35 minutes  SIGNED:   Glade Lloyd, MD  [facility] 11/01/2018, 12:54 PM

## 2018-11-01 NOTE — Progress Notes (Signed)
[name] to be D/C'd Home per MD order.  Discussed with the patient and all questions fully answered.  VSS, Skin clean, dry and intact without evidence of skin break down, no evidence of skin tears noted. IV catheter discontinued intact. Site without signs and symptoms of complications. Dressing and pressure applied.  An After Visit Summary was printed and given to the patient. Patient received prescription.  D/c education completed with patient/family including follow up instructions, medication list, d/c activities limitations if indicated, with other d/c instructions as indicated by MD - patient able to verbalize understanding, all questions fully answered.   Patient instructed to return to ED, call 911, or call MD for any changes in condition.   Patient escorted via WC, and D/C home via private auto.  Eligah East 11/01/2018 3:39 PM

## 2018-11-01 NOTE — Progress Notes (Signed)
SATURATION QUALIFICATIONS: (This note is used to comply with regulatory documentation for home oxygen)  Patient Saturations on Room Air at Rest =87 %  Patient Saturations on Room Air while Ambulating = 83%  Patient Saturations on 4Liters of oxygen while Ambulating = 88-91%%  Please briefly explain why patient needs home oxygen:

## 2018-11-06 ENCOUNTER — Telehealth: Payer: Self-pay

## 2018-11-06 NOTE — Telephone Encounter (Signed)
contacted patient to reschedule her appointment if she agreed and that there wasn't any major concern that needed to be addressed asa soon as possible.  Spoke with patient and she agreed to reschedule appointment in 6 weeks.

## 2018-11-12 ENCOUNTER — Ambulatory Visit: Payer: Medicare Other | Admitting: Internal Medicine

## 2018-11-12 NOTE — Telephone Encounter (Signed)
Pt already rescheduled to see Dr. Jacques Navy 12/31/18. Will remove from the covid 19 cancel pool.

## 2018-12-17 ENCOUNTER — Telehealth: Payer: Self-pay

## 2018-12-17 NOTE — Telephone Encounter (Signed)
Called pt and verified upcoming appointment Monday 5-11 @9AM  DOXY.ME SHE HAS BP CUFF, SCALE AND EMAIL  NO MYCHART. VERBAL CONSENT OBTAINED   1. "What is the BEST phone number to call the day of the visit?" - [phone] PHONE  2. "Do you have or have access to (through a family member/friend) a smartphone with video capability that we can use for your visit?" a. If yes - list this number in appt notes as "cell" (if different from BEST phone #) and list the appointment type as a VIDEO visit in appointment notes b. If no - list the appointment type as a PHONE visit in appointment notes  Confirm consent - "In the setting of the current Covid19 crisis, you are scheduled for a (phone or video) visit with your provider on (date) at (time).  Just as we do with many in-office visits, in order for you to participate in this visit, we must obtain consent.  If you'd like, I can send this to your mychart (if signed up) or email for you to review.  Otherwise, I can obtain your verbal consent now.  All virtual visits are billed to your insurance company just like a normal visit would be.  By agreeing to a virtual visit, we'd like you to understand that the technology does not allow for your provider to perform an examination, and thus may limit your provider's ability to fully assess your condition. If your provider identifies any concerns that need to be evaluated in person, we will make arrangements to do so.  Finally, though the technology is pretty good, we cannot assure that it will always work on either your or our end, and in the setting of a video visit, we may have to convert it to a phone-only visit.  In either situation, we cannot ensure that we have a secure connection.  Are you willing to proceed?"   3. Advise patient to be prepared - "Two hours prior to your appointment, go ahead and check your blood pressure, pulse, oxygen saturation, and your weight (if you have the equipment to check those) and  write them all down. When your visit starts, your provider will ask you for this information. If you have an Apple Watch or Kardia device, please plan to have heart rate information ready on the day of your appointment. Please have a pen and paper handy nearby the day of the visit as well."   TELEPHONE CALL NOTE  [name] has been deemed a candidate for a follow-up tele-health visit to limit community exposure during the Covid-19 pandemic. I spoke with the patient via phone to ensure availability of phone/video source, confirm preferred email & phone number, and discuss instructions and expectations.  I reminded [name] to be prepared with any vital sign and/or heart rhythm information that could potentially be obtained via home monitoring, at the time of her visit. I reminded [name] to expect a phone call prior to her visit.  Alyson InglesMichelle L Broome, LPN 0/98/11914/27/2020 4:783:05 PM [phone])**  **If using a computer, in order to ensure the best quality for their visit they will need to use either of the following Internet Browsers: [employer], or Google Chrome**  IF USING DOXIMITY or DOXY.ME - The patient will receive a link just prior to their visit by text.     FULL LENGTH CONSENT FOR TELE-HEALTH VISIT   I hereby voluntarily request, consent and authorize [facility] and its employed or contracted physicians, physician  assistants, nurse practitioners or other licensed health care professionals (the Practitioner), to provide me with telemedicine health care services (the "Services") as deemed necessary by the treating Practitioner. I acknowledge and consent to receive the Services by the Practitioner via telemedicine. I understand that the telemedicine visit will involve communicating with the Practitioner through live audiovisual communication technology and the disclosure of certain medical information by electronic transmission. I acknowledge that I have been given the opportunity  to request an in-person assessment or other available alternative prior to the telemedicine visit and am voluntarily participating in the telemedicine visit.  I understand that I have the right to withhold or withdraw my consent to the use of telemedicine in the course of my care at any time, without affecting my right to future care or treatment, and that the Practitioner or I may terminate the telemedicine visit at any time. I understand that I have the right to inspect all information obtained and/or recorded in the course of the telemedicine visit and may receive copies of available information for a reasonable fee.  I understand that some of the potential risks of receiving the Services via telemedicine include:  Marland Kitchen Delay or interruption in medical evaluation due to technological equipment failure or disruption; . Information transmitted may not be sufficient (e.g. poor resolution of images) to allow for appropriate medical decision making by the Practitioner; and/or  . In rare instances, security protocols could fail, causing a breach of personal health information.  Furthermore, I acknowledge that it is my responsibility to provide information about my medical history, conditions and care that is complete and accurate to the best of my ability. I acknowledge that Practitioner's advice, recommendations, and/or decision may be based on factors not within their control, such as incomplete or inaccurate data provided by me or distortions of diagnostic images or specimens that may result from electronic transmissions. I understand that the practice of medicine is not an exact science and that Practitioner makes no warranties or guarantees regarding treatment outcomes. I acknowledge that I will receive a copy of this consent concurrently upon execution via email to the email address I last provided but may also request a printed copy by calling the office of [facility].    I understand that my insurance  will be billed for this visit.   I have read or had this consent read to me. . I understand the contents of this consent, which adequately explains the benefits and risks of the Services being provided via telemedicine.  . I have been provided ample opportunity to ask questions regarding this consent and the Services and have had my questions answered to my satisfaction. . I give my informed consent for the services to be provided through the use of telemedicine in my medical care  By participating in this telemedicine visit I agree to the above.

## 2018-12-28 ENCOUNTER — Telehealth: Payer: Self-pay | Admitting: Internal Medicine

## 2018-12-28 NOTE — Telephone Encounter (Signed)
Iphone/ my chart via email/ consent/ pre reg completed

## 2018-12-31 ENCOUNTER — Telehealth (INDEPENDENT_AMBULATORY_CARE_PROVIDER_SITE_OTHER): Payer: Medicare Other | Admitting: Internal Medicine

## 2018-12-31 VITALS — BP 130/74 | HR 64 | Ht 63.0 in | Wt 175.0 lb

## 2018-12-31 DIAGNOSIS — I5032 Chronic diastolic (congestive) heart failure: Secondary | ICD-10-CM

## 2018-12-31 DIAGNOSIS — I10 Essential (primary) hypertension: Secondary | ICD-10-CM

## 2018-12-31 DIAGNOSIS — J441 Chronic obstructive pulmonary disease with (acute) exacerbation: Secondary | ICD-10-CM

## 2018-12-31 NOTE — Progress Notes (Signed)
Virtual Visit via Video Note   This visit type was conducted due to national recommendations for restrictions regarding the COVID-19 Pandemic (e.g. social distancing) in an effort to limit this patient's exposure and mitigate transmission in our community.  Due to her co-morbid illnesses, this patient is at least at moderate risk for complications without adequate follow up.  This format is felt to be most appropriate for this patient at this time.  All issues noted in this document were discussed and addressed.  A limited physical exam was performed with this format.  Please refer to the patient's chart for her consent to telehealth for [facility].   Date:  12/31/2018   ID:  Ramond Marrow, DOB 1926/07/13, MRN [id number]  Patient Location: Home Provider Location: Office  PCP:  Tally Joe, MD  Cardiologist:  Parke Poisson, MD  Electrophysiologist:  None   Evaluation Performed:  New Patient Evaluation  Chief Complaint:  HFpEF  History of Present Illness:    [name] is a 85 y.o. female with COPD, HTN, and recent hospital admission for cough and shortness of breath, found to have multifactorial dyspnea with COPD and heart failure with preserved ejection fraction requiring IV diuresis. She was dismissed on oxygen, and uses this primarily with movement, but less than she did at hospital discharge in March. She has been continued on furosemide 40 mg twice daily, and has had 30 pounds of intentional weight loss since October. She feels her dry weight is 175 lbs, and it has not fluctuated much in the past several weeks. She is eager to discontinue as much medication as possible.   Currently has NYHA class I-II heart failure symptoms.   The patient denies chest pain, chest pressure, dyspnea at rest, palpitations, PND, orthopnea, or leg swelling. Denies syncope or presyncope. Denies dizziness or lightheadedness.   The patient does not have symptoms concerning for COVID-19 infection  (fever, chills, cough, or new shortness of breath).    Past Medical History:  Diagnosis Date  . COPD (chronic obstructive pulmonary disease) (HCC)   . Hypertension    No past surgical history on file.   Current Meds  Medication Sig  . diltiazem (CARDIZEM LA) 360 MG 24 hr tablet Take 360 mg by mouth daily.  . metoprolol tartrate (LOPRESSOR) 25 MG tablet Take 12.5 mg by mouth daily.  . mometasone-formoterol (DULERA) 100-5 MCG/ACT AERO Inhale 2 puffs into the lungs 2 (two) times daily.  Marland Kitchen spironolactone (ALDACTONE) 25 MG tablet Take 1 tablet (25 mg total) by mouth daily.     Allergies:   Bisoprolol-hydrochlorothiazide; Meperidine; and Tetracycline   Social History   Tobacco Use  . Smoking status: Former Smoker    Types: Cigarettes  . Smokeless tobacco: Former Engineer, water Use Topics  . Alcohol use: Not Currently  . Drug use: Not Currently     Family Hx: The patient's family history is not on file.  ROS:   Please see the history of present illness.     All other systems reviewed and are negative.   Prior CV studies:   The following studies were reviewed today:  Echo 10/29/2018 1. The left ventricle has normal systolic function, with an ejection fraction of 55-60%. The cavity size was normal. There is mildly increased left ventricular wall thickness. Left ventricular diastolic Doppler parameters are consistent with  pseudonormalization. No evidence of left ventricular regional wall motion abnormalities.  2. The right ventricle has mildly reduced systolic function. The cavity  was mildly enlarged. There is no increase in right ventricular wall thickness.  3. Left atrial size was mildly dilated.  4. Right atrial size was mildly dilated.  5. There is mild mitral annular calcification present. No evidence of mitral valve stenosis.  6. The aortic valve is tricuspid Mild calcification of the aortic valve. no stenosis of the aortic valve.  7. The aortic root and ascending aorta  are normal in size and structure.  8. The inferior vena cava was dilated in size with <50% respiratory variability. PA systolic pressure 75 mmHg.   Labs/Other Tests and Data Reviewed:    EKG:  An ECG dated 10/28/2018 was personally reviewed today and demonstrated:  sinus rhythm with ventricular bigeminy   Recent Labs: 10/28/2018: B Natriuretic Peptide 437.3 10/30/2018: Hemoglobin 15.9; Platelets 189 10/31/2018: Magnesium 1.8 11/01/2018: BUN 29; Creatinine, Ser 1.12; Potassium 3.7; Sodium 132   Recent Lipid Panel No results found for: CHOL, TRIG, HDL, CHOLHDL, LDLCALC, LDLDIRECT  Wt Readings from Last 3 Encounters:  12/31/18 175 lb (79.4 kg)  10/29/18 207 lb 14.3 oz (94.3 kg)     Objective:    Vital Signs:  BP 130/74   Pulse 64   Ht 5\' 3"  (1.6 m)   Wt 175 lb (79.4 kg)   BMI 31.00 kg/m    VITAL SIGNS:  reviewed GEN:  no acute distress EYES:  sclerae anicteric, EOMI - Extraocular Movements Intact RESPIRATORY:  normal respiratory effort, symmetric expansion CARDIOVASCULAR:  no peripheral edema SKIN:  no rash, lesions or ulcers. MUSCULOSKELETAL:  no obvious deformities. NEURO:  alert and oriented x 3, no obvious focal deficit PSYCH:  normal affect  ASSESSMENT & PLAN:    1. Chronic diastolic heart failure (HCC)   2. Essential hypertension   3. COPD exacerbation (HCC)    She is well compensated from a HF standpoint, and is tolerating spironolactone and metoprolol. She has been stable on diltiazem for many years for HTN, unlikely that this needs to be adjusted at this time.   She will try to decrease lasix to 40 mg once daily and weight herself daily, per her preference for medication downtitration. If she has a change in her weights upward, she will restart lasix 40 bid and call the office to document.   We will follow up in 3 months and check an electrolyte panel to ensure renal function and lytes are stable.   COVID-19 Education: The signs and symptoms of COVID-19 were  discussed with the patient and how to seek care for testing (follow up with PCP or arrange E-visit).  The importance of social distancing was discussed today.  Time:   Today, I have spent 30 minutes with the patient with telehealth technology discussing the above problems.     Medication Adjustments/Labs and Tests Ordered: Current medicines are reviewed at length with the patient today.  Concerns regarding medicines are outlined above.   Tests Ordered: Orders Placed This Encounter  Procedures  . Basic metabolic panel    Medication Changes: No orders of the defined types were placed in this encounter.   Disposition:  Follow up in 3 month(s)  Signed, Parke PoissonGayatri A Evelynne Spiers, MD  12/31/2018 5:51 PM    [facility]

## 2018-12-31 NOTE — Patient Instructions (Signed)
Medication Instructions:  The current medical regimen is effective;  continue present plan and medications.  If you need a refill on your cardiac medications before your next appointment, please call your pharmacy.   Lab work: BMET in 3 months.  If you have labs (blood work) drawn today and your tests are completely normal, you will receive your results only by: Marland Kitchen MyChart Message (if you have MyChart) OR . A paper copy in the mail If you have any lab test that is abnormal or we need to change your treatment, we will call you to review the results.  Follow-Up: At [facility], you and your health needs are our priority.  As part of our continuing mission to provide you with exceptional heart care, we have created designated Provider Care Teams.  These Care Teams include your primary Cardiologist (physician) and Advanced Practice Providers (APPs -  Physician Assistants and Nurse Practitioners) who all work together to provide you with the care you need, when you need it. You will need a follow up appointment in 3 months. You may see Parke Poisson, MD or one of the following Advanced Practice Providers on your designated Care Team:   Theodore Demark, PA-C . Joni Reining, DNP, ANP

## 2019-01-08 ENCOUNTER — Encounter: Payer: Self-pay | Admitting: Internal Medicine

## 2019-01-08 ENCOUNTER — Ambulatory Visit: Payer: Medicare Other | Admitting: Internal Medicine

## 2019-01-08 ENCOUNTER — Other Ambulatory Visit: Payer: Self-pay

## 2019-01-08 VITALS — BP 134/64 | HR 70 | Temp 97.7°F | Ht 63.0 in | Wt 187.4 lb

## 2019-01-08 DIAGNOSIS — R0609 Other forms of dyspnea: Secondary | ICD-10-CM | POA: Diagnosis not present

## 2019-01-08 DIAGNOSIS — J9611 Chronic respiratory failure with hypoxia: Secondary | ICD-10-CM

## 2019-01-08 DIAGNOSIS — J449 Chronic obstructive pulmonary disease, unspecified: Secondary | ICD-10-CM | POA: Diagnosis not present

## 2019-01-08 DIAGNOSIS — I2781 Cor pulmonale (chronic): Secondary | ICD-10-CM | POA: Diagnosis not present

## 2019-01-08 DIAGNOSIS — J9612 Chronic respiratory failure with hypercapnia: Secondary | ICD-10-CM

## 2019-01-08 DIAGNOSIS — R0600 Dyspnea, unspecified: Secondary | ICD-10-CM

## 2019-01-08 DIAGNOSIS — J8410 Pulmonary fibrosis, unspecified: Secondary | ICD-10-CM

## 2019-01-08 NOTE — Patient Instructions (Signed)
4lpm is fine for now but we need to do an overnight study to be sure it's the right amount  Continue 4lpm with exercise but ideally need to adjust it during the day to keep above 90%   Stop dulera now   Please schedule a follow up office visit in 6 weeks, call sooner if needed try to PFTs

## 2019-01-08 NOTE — Progress Notes (Signed)
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 Follow up with PCP in 1 week with repeat CBC/BMP 2. Outpatient follow-up with pulmonary and cardiology 3. Follow up in ED if symptoms worsen or new appear   Home Health: Home health PT/RN Equipment/Devices: Oxygen via nasal cannula at 4 L/min  Discharge Condition: Guarded CODE STATUS: DNR Diet recommendation: Heart healthy/fluid restriction of up to 1500 cc a day  Brief/Interim Summary: 85 year old female with history of COPD, hypertension, recent upper respiratory infection presented with worsening shortness of breath, cough and wheezing. She was admitted with acute hypoxic respiratory failure secondary to CHF exacerbation.  She was treated with IV Lasix and has diuresed a lot.  She feels a little better.  She wants to go home.  She will have to be discharged on oral Lasix along with oxygen via nasal cannula.   Discharge Diagnoses:  Principal Problem:   COPD exacerbation (HCC) Active Problems:   Essential hypertension   Acute respiratory failure (HCC)   Hypomagnesemia   Hypokalemia  Acute hypoxic respiratory failure -Probably from CHF exacerbation -Currently on 3 L oxygen via nasal cannula.   Oxygen saturation drops down to the 80s with ambulation.  She will need to be discharged on oxygen via nasal cannula at 4 L/min for diagnosis of hypoxia. -Will benefit from outpatient pulmonary evaluation.  Acute on chronic diastolic CHF -EF of 55% with  severe pulmonary hypertension -Treated with IV Lasix 40 mg every 12 hours with good diuresis. Negative balance of 7439  cc since admission.  Patient was to go home today.  We will switch to Lasix 40 mg twice a day.Continue metoprolol and spironolactone. Outpatient follow-up with cardiology.   COPD and questionable UIP on CT scan -Case was discussed with Dr. Janalyn Powers on 10/29/2018 on phone and as per him CT findings are not consistent with UIP. -It does not seem to be COPD exacerbation as symptoms improved with diuresis. Steroids have been stopped. -Outpatient follow-up with pulmonary.  We will add Dulera to the regimen.   Severe pulmonary hypertension -Outpatient follow-up with pulmonary         History of Present Illness  01/08/2019  Pulmonary/ 1st office eval/[name]  Chief Complaint  Patient presents with   Pulmonary Consult    referred by [facility] for COPD.   Dyspnea:  Room to room slow pace, some better with 02 / doing stationery bike since d/c 15 min / day on bike x 4lpm  Cough: none Sleep: fine in recliner since d/c on 4lpm @ 30 degrees   SABA use: no better on dulera  02 4lpm at bedtime, 4lpm walk No tendency to kidney infections/ chemo/ collagen vasc dz    No obvious day to day or daytime variability or assoc excess/ purulent sputum or mucus plugs or hemoptysis or cp or chest tightness, subjective wheeze or overt sinus or hb symptoms.   Sleeping as above  without nocturnal  or  early am exacerbation  of respiratory  c/o's or need for noct saba. Also denies any obvious fluctuation of symptoms with weather or environmental changes or other aggravating or alleviating factors except as outlined above   No unusual exposure hx or h/o childhood pna/ asthma or knowledge of premature birth.  Current Allergies, Complete Past Medical History, Past Surgical History, Family History, and Social History were reviewed in [facility] electronic medical record.  ROS   The following are not active complaints unless bolded Hoarseness, sore throat, dysphagia, dental problems, itching, sneezing,  nasal congestion or discharge of excess mucus or purulent secretions, ear ache,   fever, chills, sweats, unintended wt loss or wt gain, classically pleuritic or exertional cp,  orthopnea pnd or arm/hand swelling  or leg swelling, presyncope, palpitations, abdominal pain, anorexia, nausea, vomiting, diarrhea  or change in bowel habits or change in bladder habits, change in stools or change in urine, dysuria, hematuria,  rash, arthralgias, visual complaints, headache, numbness, weakness or ataxia or problems with walking or coordination,  change in mood or  memory.           Past Medical History:  Diagnosis Date   COPD (chronic obstructive pulmonary disease) (HCC)    Hypertension     Outpatient Medications Prior to Visit  Medication Sig Dispense Refill   diltiazem (CARDIZEM LA) 360 MG 24 hr tablet Take 360 mg by mouth daily.     furosemide (LASIX) 40 MG tablet Take 1 tablet (40 mg total) by mouth 2 (two) times daily for 30 days. 60 tablet 0   metoprolol tartrate (LOPRESSOR) 25 MG tablet Take 12.5 mg by mouth daily.     mometasone-formoterol (DULERA) 100-5 MCG/ACT AERO Inhale 2 puffs into the lungs 2 (two) times daily. 1 Inhaler 0   Multiple Vitamins-Minerals (PRESERVISION AREDS 2) CAPS Take 1 capsule by mouth daily.     OXYGEN 4 lpm with sleep and exercise [employer]     spironolactone (ALDACTONE) 25 MG tablet Take 1 tablet (25 mg total) by mouth daily. 30 tablet 0      Objective:     BP 134/64 (BP Location: Left Arm, Cuff Size: Normal)    Pulse 70    Temp 97.7 F (36.5 C) (Oral)    Ht 5\' 3"  (1.6 m)    Wt 187 lb 6.4 oz (85 kg)    SpO2 91%    BMI 33.20 kg/m   SpO2: 91 %  RA  Pleasant amb wf nad   HEENT: nl dentition, turbinates bilaterally, and oropharynx. Nl external ear canals without cough reflex   NECK :  without JVD/Nodes/TM/ nl carotid upstrokes  bilaterally   LUNGS: no acc muscle use,  slt kyphotic chest/ trace insp crackles in bases   CV:  RRR  no s3  II/VI sem  ? slt  in P2, and 1+ pitting Both LEs  ABD:  soft and nontender with nl inspiratory excursion in the supine position. No bruits or organomegaly appreciated, bowel sounds nl  MS:  Nl gait/ ext warm without deformities, calf tenderness, cyanosis or clubbing No obvious joint restrictions   SKIN: warm and dry without lesions    NEURO:  alert, approp, nl sensorium with  no motor or cerebellar deficits apparent.     I personally reviewed images and agree with radiology impression as follows:   Chest HRCT  10/27/2018 1. Very limited motion degraded scan. 2. 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 Given  the limitations of this scan, suggest attention on a follow-up high-resolution chest CT study in 3 months. Findings are categorized as probable UIP per consensus guidelines        Assessment   DOE (dyspnea on exertion) Onset sob 2010  - PFTs 11/16/17  FEV1  1.46 / FVC 2.67 with ratio 0.54 and dlco 15.2=57% pred at [facility] ? While on stiolto  - Echo 10/29/2018  PAS 75 with Mild LAE/RAE c/w longtanding dz  C/w mild copd evolving very mild ILD / chf with cor pulmale but this appears longstanding  - see separate dx's    COPD  GOLD ?  Quit smoking 1968  - PFT's 11/16/17 at [facility] c/w mild/mod obstruction ? while on stiolto but pt couldn't tell was helping - 01/08/2019 d/c dulera as pt doesn't think it's helping either   pfts at [facility] difficult to interpret s f/v loop in care everywhere nor the % predicted listed for her fev1 nor response p saba  but since no better on inhalers ok to stop for now and restart if breathing worse - key is keeping 02 adequate to prevent worsening cor pulmonale and trying to gradually recondition        Cor pulmonale (chronic) (HCC) PAS est 75 by echo 10/29/18   This  is likely WHO III so key is keeping sats above 90% at all times (see separate a/p) and optimal diuresis/ aldactone best choice     Chronic respiratory failure with hypoxia and hypercapnia (HCC) Dx at admit 10/2018 rx 4lpm 24/7 - HCO3    11/01/18  = 31 (improved)  - 01/08/2019   Walked 4lpm   2 laps @  approx 247ft each @ fast pace  stopped due to  Sob with sats 88% at end   Advised goal is to keep > 90% at all times  >>> Needs ono on 4lpm to assure that 4lpm is enough    Postinflammatory pulmonary fibrosis (HCC) HRCT non dx 10/27/2018  At this point does not need w/u for IPF at age 73 but rather just treat the symptoms/ complications for now and return for full pfts as planned in 6 weeks if  COVID - 19 restrictions have been lifted.        Total time devoted to counseling  > 50 % of initial 60 min office visit:  reviewde case with pt/  directly observed portions of ambulatory 02 saturation study/ discussion of options/alternatives/ personally creating written customized instructions  in presence of pt  then going over those specific  Instructions directly with the pt including how to use all of the meds but in particular covering each new medication in detail and the difference between the maintenance= "automatic" meds and the prns using an action plan format for the latter (If this problem/symptom => do that organization reading Left to right).  Please see AVS from this visit for a full list of these instructions which I personally wrote for this pt and  are unique to this visit.      Sandrea Hughs, MD 01/08/2019

## 2019-01-09 ENCOUNTER — Encounter: Payer: Self-pay | Admitting: Internal Medicine

## 2019-01-09 DIAGNOSIS — J9611 Chronic respiratory failure with hypoxia: Secondary | ICD-10-CM | POA: Insufficient documentation

## 2019-01-09 DIAGNOSIS — J8410 Pulmonary fibrosis, unspecified: Secondary | ICD-10-CM | POA: Insufficient documentation

## 2019-01-09 DIAGNOSIS — J9612 Chronic respiratory failure with hypercapnia: Secondary | ICD-10-CM | POA: Insufficient documentation

## 2019-01-09 DIAGNOSIS — I2781 Cor pulmonale (chronic): Secondary | ICD-10-CM | POA: Insufficient documentation

## 2019-01-09 DIAGNOSIS — J449 Chronic obstructive pulmonary disease, unspecified: Secondary | ICD-10-CM | POA: Insufficient documentation

## 2019-01-09 NOTE — Assessment & Plan Note (Signed)
Onset sob 2010  - PFTs 11/16/17  FEV1  1.46 / FVC 2.67 with ratio 0.54 and dlco 15.2=57% pred at [facility] ? While on stiolto  - Echo 10/29/2018  PAS 75 with Mild LAE/RAE c/w longtanding dz  C/w mild copd evolving very mild ILD / chf with cor pulmale but this appears longstanding  - see separate dx's

## 2019-01-09 NOTE — Assessment & Plan Note (Signed)
HRCT non dx 10/27/2018  At this point does not need w/u for IPF at age 85 but rather just treat the symptoms/ complications for now and return for full pfts as planned in 6 weeks if  COVID - 19 restrictions have been lifted.    Total time devoted to counseling  > 50 % of initial 60 min office visit:  reviewde case with pt/  directly observed portions of ambulatory 02 saturation study/ discussion of options/alternatives/ personally creating written customized instructions  in presence of pt  then going over those specific  Instructions directly with the pt including how to use all of the meds but in particular covering each new medication in detail and the difference between the maintenance= "automatic" meds and the prns using an action plan format for the latter (If this problem/symptom => do that organization reading Left to right).  Please see AVS from this visit for a full list of these instructions which I personally wrote for this pt and  are unique to this visit.

## 2019-01-09 NOTE — Assessment & Plan Note (Signed)
Dx at admit 10/2018 rx 4lpm 24/7 - HCO3    11/01/18  = 31 (improved)  - 01/08/2019   Walked 4lpm   2 laps @  approx 211ft each @ fast pace  stopped due to  Sob with sats 88% at end   Advised goal is to keep > 90% at all times  >>> Needs ono on 4lpm to assure that 4lpm is enough

## 2019-01-09 NOTE — Assessment & Plan Note (Addendum)
PAS est 75 by echo 10/29/18   This is likely WHO III so key is keeping sats above 90% at all times (see separate a/p) and optimal diuresis/ aldactone best choice

## 2019-01-09 NOTE — Assessment & Plan Note (Addendum)
Quit smoking 1968  - PFT's 11/16/17 at [facility] c/w mild/mod obstruction ? while on stiolto but pt couldn't tell was helping - 01/08/2019 d/c dulera as pt doesn't think it's helping either   pfts at [facility] difficult to interpret s f/v loop in care everywhere nor the % predicted listed for her fev1 nor response p saba  but since no better on inhalers ok to stop for now and restart if breathing worse - key is keeping 02 adequate to prevent worsening cor pulmonale and trying to gradually recondition

## 2019-01-15 ENCOUNTER — Encounter: Payer: Self-pay | Admitting: Internal Medicine

## 2019-02-08 ENCOUNTER — Telehealth: Payer: Self-pay | Admitting: Internal Medicine

## 2019-02-08 NOTE — Telephone Encounter (Signed)
ONO on 4lpm done by [facility] 01/15/2019  Per MW- normal, continue 4lpm noct o2  Spoke with pt and notified of results per Dr. Melvyn Novas. Pt verbalized understanding and denied any questions.

## 2019-02-21 ENCOUNTER — Other Ambulatory Visit: Payer: Self-pay | Admitting: Internal Medicine

## 2019-02-25 ENCOUNTER — Other Ambulatory Visit (HOSPITAL_COMMUNITY)
Admission: RE | Admit: 2019-02-25 | Discharge: 2019-02-25 | Disposition: A | Discharge status: Home / Self Care | Payer: Medicare Other | Source: Ambulatory Visit | Attending: Internal Medicine | Admitting: Internal Medicine

## 2019-02-25 DIAGNOSIS — Z01812 Encounter for preprocedural laboratory examination: Secondary | ICD-10-CM | POA: Insufficient documentation

## 2019-02-25 DIAGNOSIS — Z1159 Encounter for screening for other viral diseases: Secondary | ICD-10-CM | POA: Diagnosis not present

## 2019-02-25 LAB — SARS CORONAVIRUS 2 (TAT 6-24 HRS): SARS Coronavirus 2: NEGATIVE

## 2019-03-01 ENCOUNTER — Other Ambulatory Visit: Payer: Self-pay

## 2019-03-01 ENCOUNTER — Ambulatory Visit: Payer: Medicare Other | Admitting: Internal Medicine

## 2019-03-01 ENCOUNTER — Ambulatory Visit (INDEPENDENT_AMBULATORY_CARE_PROVIDER_SITE_OTHER): Payer: Medicare Other | Admitting: Internal Medicine

## 2019-03-01 DIAGNOSIS — R0600 Dyspnea, unspecified: Secondary | ICD-10-CM

## 2019-03-01 DIAGNOSIS — R0609 Other forms of dyspnea: Secondary | ICD-10-CM

## 2019-03-01 LAB — PULMONARY FUNCTION TEST
DL/VA: 1.85 ml/min/mmHg/L
DLCO unc: 8.04 ml/min/mmHg
FEF 25-75 Post: 0.73 L/sec
FEF 25-75 Pre: 0.65 L/sec
FEF2575-%Change-Post: 11 %
FEF2575-%Pred-Post: 98 %
FEF2575-%Pred-Pre: 88 %
FEV1-%Change-Post: 2 %
FEV1-%Pred-Post: 110 %
FEV1-%Pred-Pre: 107 %
FEV1-Post: 1.54 L
FEV1-Pre: 1.5 L
FEV1FVC-%Change-Post: 3 %
FEV1FVC-%Pred-Pre: 83 %
FEV6-%Change-Post: 0 %
FEV6-%Pred-Post: 137 %
FEV6-%Pred-Pre: 136 %
FEV6-Post: 2.45 L
FEV6-Pre: 2.43 L
FEV6FVC-%Change-Post: 1 %
FEV6FVC-%Pred-Post: 106 %
FEV6FVC-%Pred-Pre: 104 %
FVC-%Change-Post: 0 %
FVC-%Pred-Post: 129 %
FVC-%Pred-Pre: 130 %
FVC-Post: 2.49 L
FVC-Pre: 2.5 L
Post FEV1/FVC ratio: 62 %
Post FEV6/FVC ratio: 98 %
Pre FEV1/FVC ratio: 60 %
Pre FEV6/FVC Ratio: 97 %
RV % pred: 71 %
RV: 1.83 L
TLC % pred: 93 %
TLC: 4.49 L

## 2019-03-01 NOTE — Progress Notes (Signed)
Full PFT performed today. °

## 2019-03-20 ENCOUNTER — Other Ambulatory Visit: Payer: Self-pay

## 2019-03-20 ENCOUNTER — Ambulatory Visit (INDEPENDENT_AMBULATORY_CARE_PROVIDER_SITE_OTHER): Payer: Medicare Other | Admitting: Internal Medicine

## 2019-03-20 ENCOUNTER — Encounter: Payer: Self-pay | Admitting: Internal Medicine

## 2019-03-20 DIAGNOSIS — J9611 Chronic respiratory failure with hypoxia: Secondary | ICD-10-CM | POA: Diagnosis not present

## 2019-03-20 DIAGNOSIS — I2781 Cor pulmonale (chronic): Secondary | ICD-10-CM

## 2019-03-20 DIAGNOSIS — J449 Chronic obstructive pulmonary disease, unspecified: Secondary | ICD-10-CM

## 2019-03-20 DIAGNOSIS — R0600 Dyspnea, unspecified: Secondary | ICD-10-CM

## 2019-03-20 DIAGNOSIS — J9612 Chronic respiratory failure with hypercapnia: Secondary | ICD-10-CM

## 2019-03-20 DIAGNOSIS — R0609 Other forms of dyspnea: Secondary | ICD-10-CM | POA: Diagnosis not present

## 2019-03-20 NOTE — Progress Notes (Signed)
[name], female    DOB: 07/03/1926,    MRN: [id number]   Brief patient profile:  79 yowf retired french patient  Quit smoking 1968 due to voice issues and played tennis into her 13s (early 39s)  with onset of doe x 2010 with gradual progression to point of MMRC3 = can't walk 100 yards even at a slow pace at a flat grade s stopping due to sob but pfts nl 11/16/17  then acutely worse late feb 2020    Admit date: 10/27/2018 Discharge date: 11/01/2018  CODE STATUS: DNR   Brief/Interim Summary: 85 year old female with history of COPD, hypertension, recent upper respiratory infection presented with worsening shortness of breath, cough and wheezing. She was admitted with acute hypoxic respiratory failure secondary to CHF exacerbation.  She was treated with IV Lasix and has diuresed a lot.  She feels a little better.  She wants to go home.  She will have to be discharged on oral Lasix along with oxygen via nasal cannula.   Discharge Diagnoses:  Principal Problem:   COPD exacerbation (HCC) Active Problems:   Essential hypertension   Acute respiratory failure (HCC)   Hypomagnesemia   Hypokalemia  Acute hypoxic respiratory failure -Probably from CHF exacerbation -Currently on 3 L oxygen via nasal cannula.   Oxygen saturation drops down to the 80s with ambulation.  She will need to be discharged on oxygen via nasal cannula at 4 L/min for diagnosis of hypoxia. -Will benefit from outpatient pulmonary evaluation.  Acute on chronic diastolic CHF -EF of 55% with severe pulmonary hypertension -Treated with IV Lasix 40 mg every 12 hours with good diuresis. Negative balance of 7439  cc since admission.  Patient was to go home today.  We will switch to Lasix 40 mg twice a day.Continue metoprolol and spironolactone. Outpatient follow-up with cardiology.   COPD and questionable UIP on CT scan -Case was discussed with Dr. Janalyn Shy on 10/29/2018 on phone and as per him CT findings  are not consistent with UIP. -It does not seem to be COPD exacerbation as symptoms improved with diuresis. Steroids have been stopped. -Outpatient follow-up with pulmonary.  We will add Dulera to the regimen.   Severe pulmonary hypertension -Outpatient follow-up with pulmonary      History of Present Illness  01/08/2019  Pulmonary/ 1st office eval/[name]  Chief Complaint  Patient presents with  . Pulmonary Consult    referred by [facility] for COPD.   Dyspnea:  Room to room slow pace, some better with 02 / doing stationery bike since d/c 15 min / day on bike x 4lpm  Cough: none Sleep: fine in recliner since d/c on 4lpm @ 30 degrees   SABA use: no better on dulera  02 4lpm at bedtime, 4lpm walk No tendency to kidney infections/ chemo/ collagen vasc dz   rec 4lpm is fine for now but we need to do an overnight study to be sure it's the right amount Continue 4lpm with exercise but ideally need to adjust it during the day to keep above 90%  Stop dulera now  Please schedule a follow up office visit in 6 weeks, call sooner if needed try to PFTs    03/20/2019  f/u ov/[name] re: GOLD I copd spirometry  / 02 dep/ cor pulmonale  Chief Complaint  Patient presents with  . Follow-up    PFT done on 03/01/19. Her breathing has improved some.    Dyspnea:  Bicycle x 30 min x 7 d  per week and resistance set low, does not check sats  Cough: none Sleeping: at 30-40 degrees in reclining  Chair since last admit   SABA use: none 02: 4lpm hs and ex    No obvious day to day or daytime variability or assoc excess/ purulent sputum or mucus plugs or hemoptysis or cp or chest tightness, subjective wheeze or overt sinus or hb symptoms.   Sleeping as above "don't try to lie down"  without nocturnal  or early am exacerbation  of respiratory  c/o's or need for noct saba. Also denies any obvious fluctuation of symptoms with weather or environmental changes or other aggravating or alleviating factors except as  outlined above   No unusual exposure hx or h/o childhood pna/ asthma or knowledge of premature birth.  Current Allergies, Complete Past Medical History, Past Surgical History, Family History, and Social History were reviewed in [facility] electronic medical record.  ROS  The following are not active complaints unless bolded Hoarseness, sore throat, dysphagia, dental problems, itching, sneezing,  nasal congestion or discharge of excess mucus or purulent secretions, ear ache,   fever, chills, sweats, unintended wt loss or wt gain, classically pleuritic or exertional cp,  orthopnea pnd or arm/hand swelling  or leg swelling, presyncope, palpitations, abdominal pain, anorexia, nausea, vomiting, diarrhea  or change in bowel habits or change in bladder habits, change in stools or change in urine, dysuria, hematuria,  rash, arthralgias, visual complaints, headache, numbness, weakness or ataxia or problems with walking or coordination,  change in mood or  memory.        Current Meds  Medication Sig  . diltiazem (CARDIZEM LA) 360 MG 24 hr tablet Take 360 mg by mouth daily.  . furosemide (LASIX) 40 MG tablet Take 1 tablet (40 mg total) by mouth 2 (two) times daily for 30 days. (Patient taking differently: Take 40 mg by mouth daily. )  . metoprolol tartrate (LOPRESSOR) 25 MG tablet Take 12.5 mg by mouth daily.  . Multiple Vitamins-Minerals (PRESERVISION AREDS 2) CAPS Take 1 capsule by mouth daily.  . OXYGEN 4 lpm with sleep and exercise [employer]  . spironolactone (ALDACTONE) 25 MG tablet Take 1 tablet (25 mg total) by mouth daily.             Past Medical History:  Diagnosis Date  . COPD (chronic obstructive pulmonary disease) (HCC)   . Hypertension        Objective:     Pleasant amb wf nad   Wt Readings from Last 3 Encounters:  03/20/19 185 lb (83.9 kg)  01/08/19 187 lb 6.4 oz (85 kg)  12/31/18 175 lb (79.4 kg)     Vital signs reviewed - Note on arrival 02 sats  94% on RA       . HEENT: nl dentition / oropharynx. Nl external ear canals without cough reflex -  Mild bilateral non-specific turbinate edema     NECK :  without JVD/Nodes/TM/ nl carotid upstrokes bilaterally   LUNGS: no acc muscle use,  Slightly kyphotic/ Min barrel  contour chest wall with bilateral distant insp crackles R > L and  slightly decreased bs s audible wheeze and  without cough on insp or exp maneuver and min  Hyperresonant  to  percussion bilaterally     CV:  RRR  no s3  II/VI sem min  increase in P2, and trace edema LE's sym bilaterally  ABD:  soft and nontender with pos end  insp Hoover's  in the  supine position. No bruits or organomegaly appreciated, bowel sounds nl  MS:   Nl gait/  ext warm without deformities, calf tenderness, cyanosis or clubbing No obvious joint restrictions   SKIN: warm and dry without lesions    NEURO:  alert, approp, nl sensorium with  no motor or cerebellar deficits apparent.                Assessment

## 2019-03-20 NOTE — Patient Instructions (Addendum)
Keep doing the exercise but monitor your saturations to keep them above 90% and if not you need to turn up your 02 or go slower   Please schedule a follow up visit in 6  months but call sooner if needed

## 2019-03-22 ENCOUNTER — Telehealth: Payer: Self-pay

## 2019-03-22 ENCOUNTER — Telehealth: Payer: Self-pay | Admitting: *Deleted

## 2019-03-22 NOTE — Telephone Encounter (Signed)
Attempted to call pt to reschedule appointment for the week on 8/3-8/7. Left message to call back.

## 2019-03-22 NOTE — Telephone Encounter (Addendum)

## 2019-03-23 ENCOUNTER — Encounter: Payer: Self-pay | Admitting: Internal Medicine

## 2019-03-23 NOTE — Assessment & Plan Note (Addendum)
Dx at admit 10/2018 rx 4lpm 24/7 - HCO3    11/01/18  = 31 (improved)  - 01/08/2019   Walked 4lpm   2 laps @  approx 219ft each @ fast pace  stopped due to  Sob with sats 88% at end  - ONO on 01/15/2019 4lpm  Desat x 61min 40 sec (one event ) > no change rx  - 03/20/2019   Walked RA x one lap =  approx 250 ft - stopped due to  Sob with sats down to 86% at avg pace on 4lpm POC   May well be she needs non-pulsed 02 but she likes her poc so should pace better with goal of keeping sats > 90% at all times due to [facility] cor pulmonale (see separate a/p)

## 2019-03-23 NOTE — Assessment & Plan Note (Signed)
Quit smoking 1968  - PFT's 11/16/17 at [place] c/w mild/mod obstruction ? while on stiolto but pt couldn't tell was helping - 01/08/2019 d/c dulera as pt doesn't think it's helping either  - PFT's  03/01/19  FEV1 1.54 (110 % ) ratio 0.62  p 2 % improvement from saba p nothing prior to study with DLCO  64 % corrects to 68  % for alv volume    Severity of airflow obst is mild and probably not a major factor in limiting activity tolerance - could consider another trial of bronchodilators eg anoro if losing ground Pt is Group B in terms of symptom/risk and laba/lama therefore appropriate rx at this point >>>  Hold off for now as did not improve on LABA

## 2019-03-23 NOTE — Assessment & Plan Note (Signed)
PAS est 75 by echo 10/29/18   Minimal peripheral edema only manifestation so far > no changes needed    I had an extended discussion with the patient  reviewing all relevant studies completed to date and  lasting 15 to 20 minutes of a 25 minute visit  which included directly observing ambulatory 02 saturation study documented in a/p section of  today's  office note.  Each maintenance medication was reviewed in detail including most importantly the difference between maintenance and prns and under what circumstances the prns are to be triggered using an action plan format that is not reflected in the computer generated alphabetically organized AVS.     Please see AVS for specific instructions unique to this visit that I personally wrote and verbalized to the the pt in detail and then reviewed with pt  by my nurse highlighting any changes in therapy recommended at today's visit .

## 2019-03-28 ENCOUNTER — Telehealth: Payer: Self-pay | Admitting: *Deleted

## 2019-03-28 ENCOUNTER — Encounter: Payer: Self-pay | Admitting: Internal Medicine

## 2019-03-28 ENCOUNTER — Other Ambulatory Visit: Payer: Self-pay

## 2019-03-28 ENCOUNTER — Telehealth (INDEPENDENT_AMBULATORY_CARE_PROVIDER_SITE_OTHER): Payer: Medicare Other | Admitting: Internal Medicine

## 2019-03-28 VITALS — BP 125/67 | HR 59 | Ht 63.0 in | Wt 181.0 lb

## 2019-03-28 DIAGNOSIS — J441 Chronic obstructive pulmonary disease with (acute) exacerbation: Secondary | ICD-10-CM | POA: Diagnosis not present

## 2019-03-28 DIAGNOSIS — I5032 Chronic diastolic (congestive) heart failure: Secondary | ICD-10-CM

## 2019-03-28 DIAGNOSIS — I10 Essential (primary) hypertension: Secondary | ICD-10-CM

## 2019-03-28 LAB — BASIC METABOLIC PANEL
BUN/Creatinine Ratio: 22 (ref 12–28)
BUN: 28 mg/dL (ref 10–36)
CO2: 22 mmol/L (ref 20–29)
Calcium: 10.5 mg/dL — ABNORMAL HIGH (ref 8.7–10.3)
Chloride: 97 mmol/L (ref 96–106)
Creatinine, Ser: 1.29 mg/dL — ABNORMAL HIGH (ref 0.57–1.00)
GFR calc Af Amer: 42 mL/min/{1.73_m2} — ABNORMAL LOW (ref >59)
GFR calc non Af Amer: 36 mL/min/{1.73_m2} — ABNORMAL LOW (ref >59)
Glucose: 161 mg/dL — ABNORMAL HIGH (ref 65–99)
Potassium: 5.1 mmol/L (ref 3.5–5.2)
Sodium: 138 mmol/L (ref 134–144)

## 2019-03-28 NOTE — Telephone Encounter (Signed)
SPOKE TO PATIENT. INSTRUCTION WAS GIVEN FROM TODAY'S VIRTUAL VISIT.  AVS SUMMARY IS MAILED .  PATIENT VERBALIZED UNDERSTANDING. 

## 2019-03-28 NOTE — Patient Instructions (Addendum)
Medication Instructions:   No changes   If you need a refill on your cardiac medications before your next appointment, please call your pharmacy.   Lab work:   not needed  Testing/Procedures: Not needed  Follow-Up: At [facility], you and your health needs are our priority.  As part of our continuing mission to provide you with exceptional heart care, we have created designated Provider Care Teams.  These Care Teams include your primary Cardiologist (physician) and Advanced Practice Providers (APPs -  Physician Assistants and Nurse Practitioners) who all work together to provide you with the care you need, when you need it. . You will need a follow up appointment in 6  months.  Please call our office 2 months in advance to schedule this appointment.  You may see Elouise Munroe, MD*or one of the following Advanced Practice Providers on your designated Care Team:   . Rosaria Ferries, PA-C . Jory Sims, DNP, ANP  Any Other Special Instructions Will Be Listed Below (If Applicable).

## 2019-04-03 ENCOUNTER — Other Ambulatory Visit: Payer: Self-pay

## 2019-04-03 ENCOUNTER — Telehealth: Payer: Medicare Other | Admitting: Internal Medicine

## 2019-04-03 DIAGNOSIS — Z79899 Other long term (current) drug therapy: Secondary | ICD-10-CM

## 2019-04-03 NOTE — Progress Notes (Signed)
Notes recorded by Elouise Munroe, MD on 04/03/2019 at 11:01 AM EDT  Cr has bumped slightly, and potassium upper limit of normal. Discussed this at visit. Needs a recheck BMP in 1-2 weeks to ensure no abnormal trend upward. Please arrange.

## 2019-04-03 NOTE — Progress Notes (Signed)
Virtual Visit via Video Note   This visit type was conducted due to national recommendations for restrictions regarding the COVID-19 Pandemic (e.g. social distancing) in an effort to limit this patient's exposure and mitigate transmission in our community.  Due to her co-morbid illnesses, this patient is at least at moderate risk for complications without adequate follow up.  This format is felt to be most appropriate for this patient at this time.  All issues noted in this document were discussed and addressed.  A limited physical exam was performed with this format.  Please refer to the patient's chart for her consent to telehealth for [facility].   Date:  03/28/2019   ID:  [name], DOB 04-Aug-1926, MRN [id number]  Patient Location: Home Provider Location: Home  PCP:  Antony Contras, MD  Cardiologist:  Elouise Munroe, MD  Electrophysiologist:  None   Evaluation Performed:  Follow-Up Visit  Chief Complaint:    History of Present Illness:    [name] is a 85 y.o. female with COPD, HTN, and hospital admission for cough and shortness of breath, found to have multifactorial dyspnea with COPD and heart failure with preserved ejection fraction requiring IV diuresis. We last had a visit on 12/31/2018. Since then she has been doing quite well.  She has been exercising frequently, denies significant symptoms, and is tolerating medical therapy well.  The patient denies chest pain, chest pressure, dyspnea at rest or with exertion, palpitations, PND, orthopnea, or leg swelling. Denies syncope or presyncope. Denies dizziness or lightheadedness.  The patient does not have symptoms concerning for COVID-19 infection (fever, chills, cough, or new shortness of breath).    Past Medical History:  Diagnosis Date  . COPD (chronic obstructive pulmonary disease) ([place])   . Hypertension    No past surgical history on file.   Current Meds  Medication Sig  . cetirizine (ZYRTEC) 5 MG chewable  tablet Chew 5 mg by mouth as needed for allergies.  Marland Kitchen diltiazem (CARDIZEM LA) 360 MG 24 hr tablet Take 360 mg by mouth daily.  . furosemide (LASIX) 40 MG tablet Take 1 tablet (40 mg total) by mouth 2 (two) times daily for 30 days. (Patient taking differently: Take 40 mg by mouth daily. )  . metoprolol tartrate (LOPRESSOR) 25 MG tablet Take 12.5 mg by mouth daily.  . Multiple Vitamins-Minerals (PRESERVISION AREDS 2) CAPS Take 1 capsule by mouth 2 (two) times daily.   . OXYGEN 4 lpm with sleep and exercise [employer]  . spironolactone (ALDACTONE) 25 MG tablet Take 1 tablet (25 mg total) by mouth daily.     Allergies:   Bisoprolol-hydrochlorothiazide, Meperidine, and Tetracycline   Social History   Tobacco Use  . Smoking status: Former Smoker    Packs/day: 0.75    Years: 17.00    Pack years: 12.75    Types: Cigarettes    Quit date: 08/22/1966    Years since quitting: 52.6  . Smokeless tobacco: Former Network engineer Use Topics  . Alcohol use: Not Currently  . Drug use: Not Currently     Family Hx: The patient's family history is not on file.  ROS:   Please see the history of present illness.     All other systems reviewed and are negative.   Prior CV studies:   The following studies were reviewed today:    Labs/Other Tests and Data Reviewed:    EKG:  No ECG reviewed.  Recent Labs: 10/28/2018: B Natriuretic Peptide 437.3  10/30/2018: Hemoglobin 15.9; Platelets 189 10/31/2018: Magnesium 1.8 03/27/2019: BUN 28; Creatinine, Ser 1.29; Potassium 5.1; Sodium 138   Recent Lipid Panel No results found for: CHOL, TRIG, HDL, CHOLHDL, LDLCALC, LDLDIRECT  Wt Readings from Last 3 Encounters:  03/28/19 181 lb (82.1 kg)  03/20/19 185 lb (83.9 kg)  01/08/19 187 lb 6.4 oz (85 kg)     Objective:    Vital Signs:  BP 125/67   Pulse (!) 59   Ht 5\' 3"  (1.6 m)   Wt 181 lb (82.1 kg)   BMI 32.06 kg/m    VITAL SIGNS:  reviewed GEN:  no acute distress EYES:  sclerae anicteric, EOMI -  Extraocular Movements Intact RESPIRATORY:  normal respiratory effort, symmetric expansion CARDIOVASCULAR:  no peripheral edema SKIN:  no rash, lesions or ulcers. MUSCULOSKELETAL:  no obvious deformities. NEURO:  alert and oriented x 3, no obvious focal deficit PSYCH:  normal affect  ASSESSMENT & PLAN:    1. Chronic diastolic heart failure (HCC)   2. Essential hypertension   3. COPD exacerbation (HCC)    Chronic diastolic heart failure- she continues on furosemide, spironolactone, metoprolol.  She is well compensated and has NYHA class I status.  No change to therapy at this time.  Hypertension- continues on diltiazem, metoprolol, spironolactone.  Blood pressure well controlled.  No change to therapy at this time  CV risk reduction-patient is exercising well and following healthy diet.  I have reiterated the recommendations as outlined below.  Exercise recommendations: Goal of exercising for at least 30 minutes a day, at least 5 times per week.  Please exercise to a moderate exertion.  This means that while exercising it is difficult to speak in full sentences, however you are not so short of breath that you feel you must stop, and not so comfortable that you can carry on a full conversation.  Exertion level should be approximately a 5/10, if 10 is the most exertion you can perform.  Diet recommendations: Recommend a heart healthy diet such as the Mediterranean diet.  This diet consists of plant based foods, healthy fats, lean meats, olive oil.  It suggests limiting the intake of simple carbohydrates such as white breads, pastries, and pastas.  It also limits the amount of red meat, wine, and dairy products such as cheese that one should consume on a daily basis.   COVID-19 Education: The signs and symptoms of COVID-19 were discussed with the patient and how to seek care for testing (follow up with PCP or arrange E-visit).  The importance of social distancing was discussed today.  Time:    Today, I have spent 25 minutes with the patient with telehealth technology discussing the above problems.     Medication Adjustments/Labs and Tests Ordered: Current medicines are reviewed at length with the patient today.  Concerns regarding medicines are outlined above.   Tests Ordered: No orders of the defined types were placed in this encounter.   Medication Changes: No orders of the defined types were placed in this encounter.   Follow Up: 6 months.  Signed, Parke PoissonGayatri A Cosmo Tetreault, MD  03/28/2019 10:58 AM     [facility]

## 2019-04-18 ENCOUNTER — Other Ambulatory Visit: Payer: Self-pay

## 2019-04-18 DIAGNOSIS — Z79899 Other long term (current) drug therapy: Secondary | ICD-10-CM

## 2019-04-18 LAB — BASIC METABOLIC PANEL
BUN/Creatinine Ratio: 22 (ref 12–28)
BUN: 31 mg/dL (ref 10–36)
CO2: 21 mmol/L (ref 20–29)
Calcium: 10.2 mg/dL (ref 8.7–10.3)
Chloride: 95 mmol/L — ABNORMAL LOW (ref 96–106)
Creatinine, Ser: 1.38 mg/dL — ABNORMAL HIGH (ref 0.57–1.00)
GFR calc Af Amer: 39 mL/min/{1.73_m2} — ABNORMAL LOW (ref >59)
GFR calc non Af Amer: 33 mL/min/{1.73_m2} — ABNORMAL LOW (ref >59)
Glucose: 234 mg/dL — ABNORMAL HIGH (ref 65–99)
Potassium: 4.8 mmol/L (ref 3.5–5.2)
Sodium: 134 mmol/L (ref 134–144)

## 2019-04-22 ENCOUNTER — Other Ambulatory Visit: Payer: Self-pay

## 2019-04-22 DIAGNOSIS — Z79899 Other long term (current) drug therapy: Secondary | ICD-10-CM

## 2019-04-22 MED ORDER — SPIRONOLACTONE 25 MG PO TABS: 12.5000 mg | ORAL_TABLET | Freq: Every day | ORAL | 11 refills | Status: AC

## 2019-05-24 ENCOUNTER — Other Ambulatory Visit: Payer: Self-pay

## 2019-05-24 DIAGNOSIS — Z79899 Other long term (current) drug therapy: Secondary | ICD-10-CM

## 2019-05-25 LAB — BASIC METABOLIC PANEL
BUN/Creatinine Ratio: 23 (ref 12–28)
BUN: 31 mg/dL (ref 10–36)
CO2: 24 mmol/L (ref 20–29)
Calcium: 10 mg/dL (ref 8.7–10.3)
Chloride: 97 mmol/L (ref 96–106)
Creatinine, Ser: 1.35 mg/dL — ABNORMAL HIGH (ref 0.57–1.00)
GFR calc Af Amer: 40 mL/min/{1.73_m2} — ABNORMAL LOW (ref >59)
GFR calc non Af Amer: 34 mL/min/{1.73_m2} — ABNORMAL LOW (ref >59)
Glucose: 145 mg/dL — ABNORMAL HIGH (ref 65–99)
Potassium: 4.5 mmol/L (ref 3.5–5.2)
Sodium: 140 mmol/L (ref 134–144)

## 2019-09-14 ENCOUNTER — Ambulatory Visit: Payer: Medicare Other

## 2019-09-14 ENCOUNTER — Other Ambulatory Visit: Payer: Self-pay

## 2019-09-14 ENCOUNTER — Ambulatory Visit: Payer: Medicare PPO | Attending: Internal Medicine

## 2019-09-14 DIAGNOSIS — Z23 Encounter for immunization: Secondary | ICD-10-CM

## 2019-09-14 NOTE — Progress Notes (Signed)
   Covid-19 Vaccination Clinic  Name:  [name]    MRN: [id number] DOB: Jul 31, 1926  09/14/2019  [name] was observed post Covid-19 immunization for 15 minutes without incidence. She was provided with Vaccine Information Sheet and instruction to access the V-Safe system.   [name] was instructed to call 911 with any severe reactions post vaccine: Marland Kitchen Difficulty breathing  . Swelling of your face and throat  . A fast heartbeat  . A bad rash all over your body  . Dizziness and weakness    Immunizations Administered    Name Date Dose VIS Date Route   Pfizer COVID-19 Vaccine 09/14/2019 12:08 PM 0.3 mL 08/02/2019 Intramuscular   Manufacturer: [employer], [employer]   Lot: [id number]   NDC: [id number]

## 2019-09-25 ENCOUNTER — Telehealth (INDEPENDENT_AMBULATORY_CARE_PROVIDER_SITE_OTHER): Payer: Medicare PPO | Admitting: Internal Medicine

## 2019-09-25 VITALS — BP 129/68 | HR 63 | Ht 63.0 in | Wt 174.0 lb

## 2019-09-25 DIAGNOSIS — Z79899 Other long term (current) drug therapy: Secondary | ICD-10-CM

## 2019-09-25 DIAGNOSIS — I5032 Chronic diastolic (congestive) heart failure: Secondary | ICD-10-CM | POA: Diagnosis not present

## 2019-09-25 DIAGNOSIS — I10 Essential (primary) hypertension: Secondary | ICD-10-CM

## 2019-09-25 DIAGNOSIS — J441 Chronic obstructive pulmonary disease with (acute) exacerbation: Secondary | ICD-10-CM

## 2019-09-25 NOTE — Progress Notes (Signed)
Virtual Visit via Video Note   This visit type was conducted due to national recommendations for restrictions regarding the COVID-19 Pandemic (e.g. social distancing) in an effort to limit this patient's exposure and mitigate transmission in our community.  Due to her co-morbid illnesses, this patient is at least at moderate risk for complications without adequate follow up.  This format is felt to be most appropriate for this patient at this time.  All issues noted in this document were discussed and addressed.  A limited physical exam was performed with this format.  Please refer to the patient's chart for her consent to telehealth for [facility].   Date:  09/25/2019   ID:  [name], DOB [birth year], MRN [id number]  Patient Location: Home Provider Location: Home  PCP:  Tally Joe, MD  Cardiologist:  Parke Poisson, MD  Electrophysiologist:  None   Evaluation Performed:  Follow-Up Visit  Chief Complaint:  F/u HFpEF  History of Present Illness:    [name] is a 86 y.o. female with COPD, HTN, and hospital admission for cough and shortness of breath, found to have multifactorial dyspnea with COPD and heart failure with preserved ejection fraction requiring IV diuresis. We last had a visit on 03/28/2019.  Doing well, no new concerns. Chronic stable dyspnea, no concern for volume retention. Feels it is her longstanding dyspnea from pulmonary causes.  Received first covid vaccine, planning for second.   The patient does not have symptoms concerning for COVID-19 infection (fever, chills, cough, or new shortness of breath).    Past Medical History:  Diagnosis Date  . COPD (chronic obstructive pulmonary disease) (HCC)   . Hypertension    No past surgical history on file.   Current Meds  Medication Sig  . cetirizine (ZYRTEC) 5 MG chewable tablet Chew 5 mg by mouth as needed for allergies.  Marland Kitchen diltiazem (CARDIZEM CD) 360 MG 24 hr capsule Take 360 mg by mouth daily.  .  furosemide (LASIX) 40 MG tablet Take 40 mg by mouth.  . metoprolol tartrate (LOPRESSOR) 25 MG tablet Take 12.5 mg by mouth daily.  . Multiple Vitamins-Minerals (PRESERVISION AREDS 2) CAPS Take 1 capsule by mouth 2 (two) times daily.   . OXYGEN 4 lpm with sleep and exercise [employer]  . spironolactone (ALDACTONE) 25 MG tablet Take 0.5 tablets (12.5 mg total) by mouth daily.  . [DISCONTINUED] diltiazem (CARDIZEM LA) 360 MG 24 hr tablet Take 360 mg by mouth daily.     Allergies:   Bisoprolol-hydrochlorothiazide, Meperidine, and Tetracycline   Social History   Tobacco Use  . Smoking status: Former Smoker    Packs/day: 0.75    Years: 17.00    Pack years: 12.75    Types: Cigarettes    Quit date: 08/22/1966    Years since quitting: 53.1  . Smokeless tobacco: Former Engineer, water Use Topics  . Alcohol use: Not Currently  . Drug use: Not Currently     Family Hx: The patient's fhx is negative for early MI or SCD.  ROS:   Please see the history of present illness.     All other systems reviewed and are negative.   Prior CV studies:   The following studies were reviewed today:    Labs/Other Tests and Data Reviewed:    EKG:  No ECG reviewed.  Recent Labs: 10/28/2018: B Natriuretic Peptide 437.3 10/30/2018: Hemoglobin 15.9; Platelets 189 10/31/2018: Magnesium 1.8 05/24/2019: BUN 31; Creatinine, Ser 1.35; Potassium 4.5; Sodium 140  Recent Lipid Panel No results found for: CHOL, TRIG, HDL, CHOLHDL, LDLCALC, LDLDIRECT  Wt Readings from Last 3 Encounters:  09/25/19 174 lb (78.9 kg)  03/28/19 181 lb (82.1 kg)  03/20/19 185 lb (83.9 kg)     Objective:    Vital Signs:  BP 129/68   Pulse 63   Ht 5\' 3"  (1.6 m)   Wt 174 lb (78.9 kg)   BMI 30.82 kg/m    VITAL SIGNS:  reviewed GEN:  no acute distress EYES:  sclerae anicteric, EOMI - Extraocular Movements Intact RESPIRATORY:  normal respiratory effort, symmetric expansion CARDIOVASCULAR:  no peripheral edema SKIN:  no rash,  lesions or ulcers. MUSCULOSKELETAL:  no obvious deformities. NEURO:  alert and oriented x 3, no obvious focal deficit PSYCH:  normal affect  ASSESSMENT & PLAN:    1. Chronic diastolic heart failure ([place])   2. Essential hypertension   3. COPD exacerbation ([place])   4. Medication management    HFpEF - no change to therapy at this time, tolerating well.   HTN - blood pressure well controlled, no changes needed at this time.   Dyspnea - this is likely primarily pulmonary, she follows with Dr. Melvyn Novas. She feels her dyspnea is chronic and unchanged since our last visit.   COVID-19 Education: The signs and symptoms of COVID-19 were discussed with the patient and how to seek care for testing (follow up with PCP or arrange E-visit).  The importance of social distancing was discussed today.  Time:   Today, I have spent 30 minutes on this encounter, and 15  minutes with the patient with telehealth technology discussing the above problems.     Medication Adjustments/Labs and Tests Ordered: Current medicines are reviewed at length with the patient today.  Concerns regarding medicines are outlined above.   Tests Ordered: No orders of the defined types were placed in this encounter.   Medication Changes: No orders of the defined types were placed in this encounter.   Follow Up: 6 mo  Signed, Elouise Munroe, MD  09/25/2019 9:03 AM    [place]

## 2019-09-25 NOTE — Patient Instructions (Signed)
Medication Instructions:  Your Physician recommend you continue on your current medication as directed.    *If you need a refill on your cardiac medications before your next appointment, please call your pharmacy*  Lab Work: None  Testing/Procedures: None  Follow-Up: At [facility], you and your health needs are our priority.  As part of our continuing mission to provide you with exceptional heart care, we have created designated Provider Care Teams.  These Care Teams include your primary Cardiologist (physician) and Advanced Practice Providers (APPs -  Physician Assistants and Nurse Practitioners) who all work together to provide you with the care you need, when you need it.  Your next appointment:   6 month(s)  The format for your next appointment:   In Person  Provider:   Weston Brass, MD

## 2019-09-26 ENCOUNTER — Ambulatory Visit (INDEPENDENT_AMBULATORY_CARE_PROVIDER_SITE_OTHER): Payer: Medicare PPO | Admitting: Internal Medicine

## 2019-09-26 ENCOUNTER — Encounter: Payer: Self-pay | Admitting: Internal Medicine

## 2019-09-26 DIAGNOSIS — R0609 Other forms of dyspnea: Secondary | ICD-10-CM

## 2019-09-26 DIAGNOSIS — R0600 Dyspnea, unspecified: Secondary | ICD-10-CM

## 2019-09-26 DIAGNOSIS — I2781 Cor pulmonale (chronic): Secondary | ICD-10-CM

## 2019-09-26 DIAGNOSIS — J9611 Chronic respiratory failure with hypoxia: Secondary | ICD-10-CM

## 2019-09-26 DIAGNOSIS — J9612 Chronic respiratory failure with hypercapnia: Secondary | ICD-10-CM

## 2019-09-26 DIAGNOSIS — J449 Chronic obstructive pulmonary disease, unspecified: Secondary | ICD-10-CM

## 2019-09-26 NOTE — Assessment & Plan Note (Signed)
PAS est 75 by echo 10/29/18   Typical of WHO III, main rx is keep 02 sats > 90% 24/7, control leg swelling with diuretics monitored by PCP

## 2019-09-26 NOTE — Assessment & Plan Note (Signed)
Dx at admit 10/2018 rx 4lpm 24/7 - HCO3    11/01/18  = 31 (improved)  - 01/08/2019   Walked 4lpm   2 laps @  approx 244ft each @ fast pace  stopped due to  Sob with sats 88% at end  - ONO on 01/15/2019 4lpm  Desat x 40 sec (one event ) > no change rx  - 03/20/2019   Walked RA x one lap =  approx 250 ft - stopped due to  Sob with sats down to 86% at avg pace on 4lpm POC   Again advised: Make sure you check your oxygen saturations at highest level of activity to be sure it stays over 90% and adjust upward to maintain this level if needed but remember to turn it back to previous settings when you stop (to conserve your supply).   Pt informed of the seriousness of COVID 19 infection as a direct risk to lung health  and safey and to close contacts and should continue to wear a facemask in public and minimize exposure to public locations but especially avoid any area or activity where non-close contacts are not observing distancing or wearing an appropriate face mask.  I strongly recommended vaccine when offered.    >>> f/u in 6 m

## 2019-09-26 NOTE — Assessment & Plan Note (Signed)
Quit smoking 1968  - PFT's 11/16/17 at [facility] c/w mild/mod obstruction ? while on stiolto but pt couldn't tell was helping - 01/08/2019 d/c dulera as pt doesn't think it's helping either  - PFT's  03/01/19  FEV1 1.54 (110 % ) ratio 0.62  p 2 % improvement from saba p nothing prior to study with DLCO  64 % corrects to 68  % for alv volume    GOLD  I criteria, well compensated,  GOLD B pattern s rx > no change needed

## 2019-09-26 NOTE — Assessment & Plan Note (Signed)
Onset sob 2010  - PFTs 11/16/17  FEV1  1.46 / FVC 2.67 with ratio 0.54 and dlco 15.2=57% pred at [facility] ? While on stiolto  - Echo 10/29/2018  PAS 75 with Mild LAE/RAE c/w longtanding dz  She has GOLD I criteria but cor pulmonale from chronic hypoxemia doing great s bronchodilators typical of a gold B but not better on stiolto, no worse off, so at age 86 main goal is to keep sats > 90%, no additional rx

## 2019-09-26 NOTE — Patient Instructions (Signed)
Make sure you check your oxygen saturations at highest level of activity to be sure it stays over 90% and adjust upward to maintain this level if needed but remember to turn it back to previous settings when you stop (to conserve your supply).     Please schedule a follow up visit in 6  months but call sooner if needed   

## 2019-09-26 NOTE — Progress Notes (Signed)
Ramond Marrow, female    DOB: 07/03/1926,    MRN: [id number]   Brief patient profile:  79 yowf retired french patient  Quit smoking 1968 due to voice issues and played tennis into her 13s (early 39s)  with onset of doe x 2010 with gradual progression to point of MMRC3 = can't walk 100 yards even at a slow pace at a flat grade s stopping due to sob but pfts nl 11/16/17  then acutely worse late feb 2020    Admit date: 10/27/2018 Discharge date: 11/01/2018  CODE STATUS: DNR   Brief/Interim Summary: 86 year old female with history of COPD, hypertension, recent upper respiratory infection presented with worsening shortness of breath, cough and wheezing. She was admitted with acute hypoxic respiratory failure secondary to CHF exacerbation.  She was treated with IV Lasix and has diuresed a lot.  She feels a little better.  She wants to go home.  She will have to be discharged on oral Lasix along with oxygen via nasal cannula.   Discharge Diagnoses:  Principal Problem:   COPD exacerbation (HCC) Active Problems:   Essential hypertension   Acute respiratory failure (HCC)   Hypomagnesemia   Hypokalemia  Acute hypoxic respiratory failure -Probably from CHF exacerbation -Currently on 3 L oxygen via nasal cannula.   Oxygen saturation drops down to the 80s with ambulation.  She will need to be discharged on oxygen via nasal cannula at 4 L/min for diagnosis of hypoxia. -Will benefit from outpatient pulmonary evaluation.  Acute on chronic diastolic CHF -EF of 55% with severe pulmonary hypertension -Treated with IV Lasix 40 mg every 12 hours with good diuresis. Negative balance of 7439  cc since admission.  Patient was to go home today.  We will switch to Lasix 40 mg twice a day.Continue metoprolol and spironolactone. Outpatient follow-up with cardiology.   COPD and questionable UIP on CT scan -Case was discussed with Dr. Janalyn Shy on 10/29/2018 on phone and as per him CT findings  are not consistent with UIP. -It does not seem to be COPD exacerbation as symptoms improved with diuresis. Steroids have been stopped. -Outpatient follow-up with pulmonary.  We will add Dulera to the regimen.   Severe pulmonary hypertension -Outpatient follow-up with pulmonary      History of Present Illness  01/08/2019  Pulmonary/ 1st office eval/Rajah Lamba  Chief Complaint  Patient presents with  . Pulmonary Consult    referred by [facility] for [facility].   Dyspnea:  Room to room slow pace, some better with 02 / doing stationery bike since d/c 15 min / day on bike x 4lpm  Cough: none Sleep: fine in recliner since d/c on 4lpm @ 30 degrees   SABA use: no better on dulera  02 4lpm at bedtime, 4lpm walk No tendency to kidney infections/ chemo/ collagen vasc dz   rec 4lpm is fine for now but we need to do an overnight study to be sure it's the right amount Continue 4lpm with exercise but ideally need to adjust it during the day to keep above 90%  Stop dulera now  Please schedule a follow up office visit in 6 weeks, call sooner if needed try to PFTs    03/20/2019  f/u ov/Lukas Pelcher re: GOLD I copd spirometry  / 02 dep/ cor pulmonale  Chief Complaint  Patient presents with  . Follow-up    PFT done on 03/01/19. Her breathing has improved some.    Dyspnea:  Bicycle x 30 min x 7 d  per week and resistance set low, does not check sats  Cough: none Sleeping: at 30-40 degrees in reclining  Chair since last admit   SABA use: none 02: 4lpm hs and ex  rec Keep doing the exercise but monitor your saturations to keep them above 90% and if not you need to turn up your 02 or go slower    Virtual Visit via Telephone Note 09/26/2019   I connected with [name] on 09/26/19 at  8:45 AM EST by telephone and verified that I am speaking with the correct person using two identifiers.   I discussed the limitations, risks, security and privacy concerns of performing an evaluation and management service by  telephone and the availability of in person appointments. I also discussed with the patient that there may be a patient responsible charge related to this service. The patient expressed understanding and agreed to proceed.   History of Present Illness: Dyspnea:  5-6 days a week x 30 min on 4lpm but does not check sats  Cough: none Sleeping: in recliner due to need to keep concentrator separate from husband's sleeping bed  SABA use: none/ no inhalers "they don't help"  02: 4lpm sleeping / ex does not check at rest. Swelling in legs is "about the same"   No obvious day to day or daytime variability or assoc excess/ purulent sputum or mucus plugs or hemoptysis or cp or chest tightness, subjective wheeze or overt sinus or hb symptoms.    Also denies any obvious fluctuation of symptoms with weather or environmental changes or other aggravating or alleviating factors except as outlined above.   Meds reviewed/ med reconciliation completed       Observations/Objective: Sounds very upbeat, no conversational sob, nl phonation/ no spont cough     Assessment and Plan: See problem list for active a/p's   Follow Up Instructions: See avs for instructions unique to this ov which includes revised/ updated med list     I discussed the assessment and treatment plan with the patient. The patient was provided an opportunity to ask questions and all were answered. The patient agreed with the plan and demonstrated an understanding of the instructions.   The patient was advised to call back or seek an in-person evaluation if the symptoms worsen or if the condition fails to improve as anticipated.  I provided 15 minutes of non-face-to-face time during this encounter.   Christinia Gully, MD

## 2019-10-05 ENCOUNTER — Ambulatory Visit: Payer: Medicare PPO | Attending: Internal Medicine

## 2019-10-05 DIAGNOSIS — Z23 Encounter for immunization: Secondary | ICD-10-CM

## 2019-10-05 NOTE — Progress Notes (Signed)
   Covid-19 Vaccination Clinic  Name:  [name]    MRN: [id number] DOB: 08/19/1926  10/05/2019  [name] was observed post Covid-19 immunization for 15 minutes without incidence. She was provided with Vaccine Information Sheet and instruction to access the V-Safe system.   [name] was instructed to call 911 with any severe reactions post vaccine: Marland Kitchen Difficulty breathing  . Swelling of your face and throat  . A fast heartbeat  . A bad rash all over your body  . Dizziness and weakness    Immunizations Administered    Name Date Dose VIS Date Route   Pfizer COVID-19 Vaccine 10/05/2019 10:32 AM 0.3 mL 08/02/2019 Intramuscular   Manufacturer: [employer], [employer]   Lot: [id number]   NDC: [address]-5

## 2019-11-26 ENCOUNTER — Encounter (INDEPENDENT_AMBULATORY_CARE_PROVIDER_SITE_OTHER): Payer: Self-pay | Admitting: Ophthalmology

## 2019-11-26 ENCOUNTER — Ambulatory Visit (INDEPENDENT_AMBULATORY_CARE_PROVIDER_SITE_OTHER): Payer: Medicare PPO | Admitting: Ophthalmology

## 2019-11-26 ENCOUNTER — Other Ambulatory Visit: Payer: Self-pay

## 2019-11-26 DIAGNOSIS — H43813 Vitreous degeneration, bilateral: Secondary | ICD-10-CM | POA: Insufficient documentation

## 2019-11-26 DIAGNOSIS — H353132 Nonexudative age-related macular degeneration, bilateral, intermediate dry stage: Secondary | ICD-10-CM | POA: Diagnosis not present

## 2019-11-26 DIAGNOSIS — H35372 Puckering of macula, left eye: Secondary | ICD-10-CM

## 2019-11-26 DIAGNOSIS — H353221 Exudative age-related macular degeneration, left eye, with active choroidal neovascularization: Secondary | ICD-10-CM | POA: Diagnosis not present

## 2019-11-26 DIAGNOSIS — Z961 Presence of intraocular lens: Secondary | ICD-10-CM | POA: Diagnosis not present

## 2019-11-26 MED ORDER — BEVACIZUMAB CHEMO INJECTION 1.25MG/0.05ML SYRINGE FOR KALEIDOSCOPE
1.2500 mg | INTRAVITREAL | Status: AC | PRN
  Administered 2019-11-26: 10:00:00 1.25 mg via INTRAVITREAL

## 2019-11-26 NOTE — Patient Instructions (Addendum)
The nature of wet macular degeneration was discussed with the patient.  Forms of therapy reviewed include the use of Anti-VEGF medications injected painlessly into the eye, as well as other possible treatment modalities, including thermal laser therapy. Fellow eye involvement and risks were discussed with the patient. Upon the finding of wet age related macular degeneration, treatment will be offered. The treatment regimen is on a treat as needed basis with the intent to treat if necessary and extend interval of exams when possible. On average 1 out of 6 patients do not need lifetime therapy. However, the risk of recurrent disease is high for a lifetime.  Initially monthly, then periodic, examinations and evaluations will determine whether the next treatment is required on the day of the examination.     The nature of posterior vitreous detachment was discussed with the patient as well as its physiology, its age prevalence, and its possible implication regarding retinal breaks and detachment.  An informational brochure was given to the patient.  All the patient's questions were answered.  The patient was asked to return if new or different flashes or floaters develops.   Patient was instructed to contact office immediately if any changes were noticed. I explained to the patient that vitreous inside the eye is similar to jello inside a bowl. As the jello melts it can start to pull away from the bowl, similarly the vitreous throughout our lives can begin to pull away from the retina. That process is called a posterior vitreous detachment. In some cases, the vitreous can tug hard enough on the retina to form a retinal tear. I discussed with the patient the signs and symptoms of a retinal detachment.  Do not rub the eye.   The nature of dry age related macular degeneration was discussed with the patient as well as its possible conversion to wet. The results of the AREDS 2 study was discussed with the patient. A  diet rich in dark leafy green vegetables was advised and specific recommendations were made regarding supplements with AREDS 2 formulation . Control of hypertension and serum cholesterol may slow the disease. Smoking cessation is mandatory to slow the disease and diminish the risk of progressing to wet age related macular degeneration. The patient was instructed in the use of an Amsler Grid and was told to return immediately for any changes in the Grid. Stressed to the patient do not rub eyes  The nature of macular pucker was discussed with the patient as well as threshold criteria for vitrectomy surgery. I explained that in rare cases another surgery is needed to actually remove a second wrinkle should it regrow.  Most often, the epiretinal membrane and underlying wrinkled internal limiting membrane are removed, to accomplish the goals.   If the operative eye is [place], cataract surgery is often recommended prior to Vitrectomy. This will enable the surgeon to have the best view during surgery and the patient optimal results in the future. Treatment options were discussed.

## 2020-01-14 ENCOUNTER — Encounter (INDEPENDENT_AMBULATORY_CARE_PROVIDER_SITE_OTHER): Payer: Self-pay | Admitting: Ophthalmology

## 2020-01-14 ENCOUNTER — Ambulatory Visit (INDEPENDENT_AMBULATORY_CARE_PROVIDER_SITE_OTHER): Payer: Medicare PPO | Admitting: Ophthalmology

## 2020-01-14 ENCOUNTER — Other Ambulatory Visit: Payer: Self-pay

## 2020-01-14 DIAGNOSIS — H353221 Exudative age-related macular degeneration, left eye, with active choroidal neovascularization: Secondary | ICD-10-CM

## 2020-01-14 MED ORDER — BEVACIZUMAB CHEMO INJECTION 1.25MG/0.05ML SYRINGE FOR KALEIDOSCOPE
1.2500 mg | INTRAVITREAL | Status: AC | PRN
  Administered 2020-01-14: 1.25 mg via INTRAVITREAL

## 2020-01-14 NOTE — Progress Notes (Signed)
01/14/2020     CHIEF COMPLAINT Patient presents for Retina Follow Up   HISTORY OF PRESENT ILLNESS: [name] is a 86 y.o. female who presents to the clinic today for:   HPI    Retina Follow Up    Patient presents with  Wet AMD.  In left eye.  Duration of 7 weeks.  Since onset it is stable.          Comments    7 week f.u - OCT OU, Possible Avastin OS Patient denies change in vision and overall has no complaints.        Last edited by Berenice Bouton on 01/14/2020  8:59 AM. (History)      Referring physician: Tally Joe, MD [phone] [place],  [place] [address]  HISTORICAL INFORMATION:   Selected notes from the MEDICAL RECORD NUMBER       CURRENT MEDICATIONS: No current outpatient medications on file. (Ophthalmic Drugs)   No current facility-administered medications for this visit. (Ophthalmic Drugs)   Current Outpatient Medications (Other)  Medication Sig  . cetirizine (ZYRTEC) 5 MG chewable tablet Chew 5 mg by mouth as needed for allergies.  Marland Kitchen diltiazem (CARDIZEM CD) 360 MG 24 hr capsule Take 360 mg by mouth daily.  . furosemide (LASIX) 40 MG tablet Take 40 mg by mouth.  . metoprolol tartrate (LOPRESSOR) 25 MG tablet Take 12.5 mg by mouth daily.  . Multiple Vitamins-Minerals (PRESERVISION AREDS 2) CAPS Take 1 capsule by mouth 2 (two) times daily.   . OXYGEN 4 lpm with sleep and exercise [employer]  . spironolactone (ALDACTONE) 25 MG tablet Take 0.5 tablets (12.5 mg total) by mouth daily.   No current facility-administered medications for this visit. (Other)      REVIEW OF SYSTEMS:    ALLERGIES Allergies  Allergen Reactions  . Bisoprolol-Hydrochlorothiazide Other (See Comments)    Other reaction(s): Cough (ALLERGY/intolerance)  . Meperidine Nausea And Vomiting  . Tetracycline Hives    PAST MEDICAL HISTORY Past Medical History:  Diagnosis Date  . COPD (chronic obstructive pulmonary disease) (HCC)   . Hypertension    History  reviewed. No pertinent surgical history.  FAMILY HISTORY History reviewed. No pertinent family history.  SOCIAL HISTORY Social History   Tobacco Use  . Smoking status: Former Smoker    Packs/day: 0.75    Years: 17.00    Pack years: 12.75    Types: Cigarettes    Quit date: 08/22/1966    Years since quitting: 53.4  . Smokeless tobacco: Former Engineer, water Use Topics  . Alcohol use: Not Currently  . Drug use: Not Currently         OPHTHALMIC EXAM:  Base Eye Exam    Visual Acuity (Snellen - Linear)      Right Left   Dist cc 20/50+1 20/70+2   Dist ph cc 20/25-2 20/40-2       Tonometry (Tonopen, 9:04 AM)      Right Left   Pressure 12 15       Pupils      Pupils Dark Light Shape React APD   Right PERRL 2 2 Round Minimal None   Left PERRL 2 2 Round Minimal None       Visual Fields (Counting fingers)      Left Right    Full Full       Extraocular Movement      Right Left    Full Full  Neuro/Psych    Oriented x3: Yes   Mood/Affect: Normal       Dilation    Left eye: 1.0% Mydriacyl, 2.5% Phenylephrine @ 9:04 AM        Slit Lamp and Fundus Exam    External Exam      Right Left   External Normal Normal       Slit Lamp Exam      Right Left   Lids/Lashes Normal Normal   Conjunctiva/Sclera White and quiet White and quiet   Cornea Clear Clear   Anterior Chamber Deep and quiet Deep and quiet   Iris Round and reactive Round and reactive   Lens Posterior chamber intraocular lens Posterior chamber intraocular lens   Anterior Vitreous Normal Normal       Fundus Exam      Right Left   Posterior Vitreous Normal Vitreous membranes, strands   Disc  Normal,,   C/D Ratio  0.55   Macula  Atrophy, Age related macular degeneration, Early age related macular degeneration, Macular thickening, Retinal pigment epithelial detachment, Membrane, Epiretinal membrane   Vessels  Normal   Periphery  Normal          IMAGING AND PROCEDURES  Imaging and  Procedures for 01/14/20  OCT, Retina - OU - Both Eyes       Right Eye Quality was good. Scan locations included subfoveal. Central Foveal Thickness: 340. Progression has been stable. Findings include abnormal foveal contour, retinal drusen , subretinal hyper-reflective material.   Left Eye Quality was good. Scan locations included subfoveal. Central Foveal Thickness: 311. Progression has improved. Findings include abnormal foveal contour, subretinal hyper-reflective material, intraretinal fluid, subretinal scarring, central retinal atrophy, outer retinal atrophy, pigment epithelial detachment.   Notes OD, with large subfoveal drusen, unchanged no signs of CNVM.  Minor epiretinal membrane. OS, stable macular configuration with stable vision.  Outer retinal atrophy contributes to the vision.  Less active CNVM, currently at 7-week interval       Intravitreal Injection, Pharmacologic Agent - OS - Left Eye       Time Out 01/14/2020. 10:04 AM. Confirmed correct patient, procedure, site, and patient consented.   Anesthesia Topical anesthesia was used. Anesthetic medications included Akten 3.5%.   Procedure Preparation included Tobramycin 0.3%, 10% betadine to eyelids. A 30 gauge needle was used.   Injection:  1.25 mg Bevacizumab (AVASTIN) SOLN   NDC: [address]-2, Lot: [id number]   Route: Intravitreal, Site: Left Eye, Waste: 0 mg  Post-op Post injection exam found visual acuity of at least counting fingers. The patient tolerated the procedure well. There were no complications. The patient received written and verbal post procedure care education. Post injection medications were not given.                 ASSESSMENT/PLAN:  Exudative age-related macular degeneration of left eye with active choroidal neovascularization (HCC) OS, stable macular configuration with stable vision.  Outer retinal atrophy contributes to the vision.  Less active CNVM, currently at 7-week interval The  nature of wet macular degeneration was discussed with the patient.  Forms of therapy reviewed include the use of Anti-VEGF medications injected painlessly into the eye, as well as other possible treatment modalities, including thermal laser therapy. Fellow eye involvement and risks were discussed with the patient. Upon the finding of wet age related macular degeneration, treatment will be offered. The treatment regimen is on a treat as needed basis with the intent to treat if necessary and extend interval of  exams when possible. On average 1 out of 6 patients do not need lifetime therapy. However, the risk of recurrent disease is high for a lifetime.  Initially monthly, then periodic, examinations and evaluations will determine whether the next treatment is required on the day of the examination.      ICD-10-CM   1. Exudative age-related macular degeneration of left eye with active choroidal neovascularization (HCC)  H35.3221 OCT, Retina - OU - Both Eyes    Intravitreal Injection, Pharmacologic Agent - OS - Left Eye    Bevacizumab (AVASTIN) SOLN 1.25 mg    1.OS, stable macular configuration with stable vision.  Outer retinal atrophy contributes to the vision.  Less active CNVM, currently at 7-week interval   2.  OD, currently still dry ARMD with high risk subfoveal drusen present  3.  Ophthalmic Meds Ordered this visit:  Meds ordered this encounter  Medications  . Bevacizumab (AVASTIN) SOLN 1.25 mg       Return in about 8 weeks (around 03/10/2020) for DILATE OU, AVASTIN OCT, OS.  There are no Patient Instructions on file for this visit.   Explained the diagnoses, plan, and follow up with the patient and they expressed understanding.  Patient expressed understanding of the importance of proper follow up care.   Clent Demark Foye Haggart M.D. Diseases & Surgery of the Retina and Vitreous Retina [facility] Diabetic [place] 01/14/20     Abbreviations: M myopia (nearsighted); A astigmatism; H  hyperopia (farsighted); P presbyopia; Mrx spectacle prescription;  CTL contact lenses; OD right eye; OS left eye; OU both eyes  XT exotropia; ET esotropia; PEK punctate epithelial keratitis; PEE punctate epithelial erosions; DES dry eye syndrome; MGD meibomian gland dysfunction; ATs artificial tears; PFAT's preservative free artificial tears; Dunellen nuclear sclerotic cataract; PSC posterior subcapsular cataract; ERM epi-retinal membrane; PVD posterior vitreous detachment; RD retinal detachment; DM diabetes mellitus; DR diabetic retinopathy; NPDR non-proliferative diabetic retinopathy; PDR proliferative diabetic retinopathy; CSME clinically significant macular edema; DME diabetic macular edema; dbh dot blot hemorrhages; CWS cotton wool spot; POAG primary open angle glaucoma; C/D cup-to-disc ratio; HVF humphrey visual field; GVF goldmann visual field; OCT optical coherence tomography; IOP intraocular pressure; BRVO Branch retinal vein occlusion; CRVO central retinal vein occlusion; CRAO central retinal artery occlusion; BRAO branch retinal artery occlusion; RT retinal tear; SB scleral buckle; PPV pars plana vitrectomy; VH Vitreous hemorrhage; PRP panretinal laser photocoagulation; IVK intravitreal kenalog; VMT vitreomacular traction; MH Macular hole;  NVD neovascularization of the disc; NVE neovascularization elsewhere; AREDS age related eye disease study; ARMD age related macular degeneration; POAG primary open angle glaucoma; EBMD epithelial/anterior basement membrane dystrophy; ACIOL anterior chamber intraocular lens; IOL intraocular lens; PCIOL posterior chamber intraocular lens; Phaco/IOL phacoemulsification with intraocular lens placement; Trego photorefractive keratectomy; LASIK laser assisted in situ keratomileusis; HTN hypertension; DM diabetes mellitus; COPD chronic obstructive pulmonary disease

## 2020-01-14 NOTE — Assessment & Plan Note (Signed)
OS, stable macular configuration with stable vision.  Outer retinal atrophy contributes to the vision.  Less active CNVM, currently at 7-week interval The nature of wet macular degeneration was discussed with the patient.  Forms of therapy reviewed include the use of Anti-VEGF medications injected painlessly into the eye, as well as other possible treatment modalities, including thermal laser therapy. Fellow eye involvement and risks were discussed with the patient. Upon the finding of wet age related macular degeneration, treatment will be offered. The treatment regimen is on a treat as needed basis with the intent to treat if necessary and extend interval of exams when possible. On average 1 out of 6 patients do not need lifetime therapy. However, the risk of recurrent disease is high for a lifetime.  Initially monthly, then periodic, examinations and evaluations will determine whether the next treatment is required on the day of the examination.

## 2020-03-10 ENCOUNTER — Ambulatory Visit (INDEPENDENT_AMBULATORY_CARE_PROVIDER_SITE_OTHER): Payer: Medicare PPO | Admitting: Ophthalmology

## 2020-03-10 ENCOUNTER — Encounter (INDEPENDENT_AMBULATORY_CARE_PROVIDER_SITE_OTHER): Payer: Self-pay | Admitting: Ophthalmology

## 2020-03-10 ENCOUNTER — Other Ambulatory Visit: Payer: Self-pay

## 2020-03-10 DIAGNOSIS — H43813 Vitreous degeneration, bilateral: Secondary | ICD-10-CM | POA: Diagnosis not present

## 2020-03-10 DIAGNOSIS — H353221 Exudative age-related macular degeneration, left eye, with active choroidal neovascularization: Secondary | ICD-10-CM

## 2020-03-10 DIAGNOSIS — H35372 Puckering of macula, left eye: Secondary | ICD-10-CM | POA: Diagnosis not present

## 2020-03-10 DIAGNOSIS — H353132 Nonexudative age-related macular degeneration, bilateral, intermediate dry stage: Secondary | ICD-10-CM | POA: Diagnosis not present

## 2020-03-10 MED ORDER — BEVACIZUMAB CHEMO INJECTION 1.25MG/0.05ML SYRINGE FOR KALEIDOSCOPE
1.2500 mg | INTRAVITREAL | Status: AC | PRN
  Administered 2020-03-10: 1.25 mg via INTRAVITREAL

## 2020-03-10 NOTE — Assessment & Plan Note (Signed)
8-week follow-up status post intravitreal Avastin OS, will repeat injection today and examination in 10 weeks

## 2020-03-10 NOTE — Progress Notes (Signed)
03/10/2020     CHIEF COMPLAINT Patient presents for Retina Follow Up   HISTORY OF PRESENT ILLNESS: [name] is a 86 y.o. female who presents to the clinic today for:   HPI    Retina Follow Up    Patient presents with  Wet AMD.  In left eye.  This started 8 weeks ago.  Duration of 8 weeks.  Since onset it is stable.          Comments    8 week f/u dilated exam, OCT(MAC) and possible Avastin OS today.  Pt denies noticeable changes to [place] OU since last visit. Pt denies ocular pain, flashes of light, or floaters OU.         Last edited by Melburn Popper, COA on 03/10/2020  9:53 AM. (History)      Referring physician: Antony Contras, MD [place],  [place] [address]  HISTORICAL INFORMATION:   Selected notes from the [facility]: No current outpatient medications on file. (Ophthalmic Drugs)   No current facility-administered medications for this visit. (Ophthalmic Drugs)   Current Outpatient Medications (Other)  Medication Sig  . cetirizine (ZYRTEC) 5 MG chewable tablet Chew 5 mg by mouth as needed for allergies.  Marland Kitchen diltiazem (CARDIZEM CD) 360 MG 24 hr capsule Take 360 mg by mouth daily.  . furosemide (LASIX) 40 MG tablet Take 40 mg by mouth.  . metoprolol tartrate (LOPRESSOR) 25 MG tablet Take 12.5 mg by mouth daily.  . Multiple Vitamins-Minerals (PRESERVISION AREDS 2) CAPS Take 1 capsule by mouth 2 (two) times daily.   . OXYGEN 4 lpm with sleep and exercise [employer]  . spironolactone (ALDACTONE) 25 MG tablet Take 0.5 tablets (12.5 mg total) by mouth daily.   No current facility-administered medications for this visit. (Other)      REVIEW OF SYSTEMS:    ALLERGIES Allergies  Allergen Reactions  . Bisoprolol-Hydrochlorothiazide Other (See Comments)    Other reaction(s): Cough (ALLERGY/intolerance)  . Meperidine Nausea And Vomiting  . Tetracycline Hives    PAST MEDICAL HISTORY Past Medical  History:  Diagnosis Date  . COPD (chronic obstructive pulmonary disease) ([facility])   . Hypertension    History reviewed. No pertinent surgical history.  FAMILY HISTORY History reviewed. No pertinent family history.  SOCIAL HISTORY Social History   Tobacco Use  . Smoking status: Former Smoker    Packs/day: 0.75    Years: 17.00    Pack years: 12.75    Types: Cigarettes    Quit date: 08/22/1966    Years since quitting: 53.5  . Smokeless tobacco: Former Network engineer  . Vaping Use: Never used  Substance Use Topics  . Alcohol use: Not Currently  . Drug use: Not Currently         OPHTHALMIC EXAM:  Base Eye Exam    Visual Acuity (ETDRS)      Right Left   Dist cc 20/25 +1 20/50 -2   Dist ph cc  20/50 +1   Correction: Glasses       Tonometry (Tonopen, 9:57 AM)      Right Left   Pressure 17 19       Pupils      Pupils Dark Light Shape React APD   Right PERRL 2 2 Round Minimal None   Left PERRL 2 2 Round Minimal None       Visual Fields (Counting fingers)  Left Right    Full Full       Extraocular Movement      Right Left    Full Full       Neuro/Psych    Oriented x3: Yes   Mood/Affect: Normal       Dilation    Left eye: 1.0% Mydriacyl, 2.5% Phenylephrine @ 9:57 AM        Slit Lamp and Fundus Exam    External Exam      Right Left   External Normal Normal       Slit Lamp Exam      Right Left   Lids/Lashes Normal Normal   Conjunctiva/Sclera White and quiet White and quiet   Cornea Clear Clear   Anterior Chamber Deep and quiet Deep and quiet   Iris Round and reactive Round and reactive   Lens Posterior chamber intraocular lens Posterior chamber intraocular lens   Anterior Vitreous Normal Normal       Fundus Exam      Right Left   Posterior Vitreous Normal Vitreous membranes, strands   Disc  Normal,,   C/D Ratio  0.55   Macula  Atrophy, Age related macular degeneration, Early age related macular degeneration, Macular thickening, Retinal  pigment epithelial detachment, Membrane, Epiretinal membrane   Vessels  Normal   Periphery  Normal          IMAGING AND PROCEDURES  Imaging and Procedures for 03/10/20  OCT, Retina - OU - Both Eyes       Right Eye Quality was good. Scan locations included subfoveal. Central Foveal Thickness: 344. Progression has improved. Findings include retinal drusen , no SRF, no IRF, subretinal hyper-reflective material.   Left Eye Quality was good. Scan locations included subfoveal. Central Foveal Thickness: 315. Progression has been stable. Findings include no SRF, abnormal foveal contour, no IRF, central retinal atrophy.   Notes OS, stable macular findings on OCT examination at 8-week interval post intravitreal Avastin, will repeat today and examination OS in 10 weeks       Intravitreal Injection, Pharmacologic Agent - OS - Left Eye       Time Out 03/10/2020. 11:07 AM. Confirmed correct patient, procedure, site, and patient consented.   Anesthesia Topical anesthesia was used. Anesthetic medications included [place] 3.5%.   Procedure Preparation included Tobramycin 0.3%, 10% betadine to eyelids, 5% betadine to ocular surface. A 30 gauge needle was used.   Injection:  1.25 mg Bevacizumab (AVASTIN) SOLN   NDC: [address]-6, Lot: [id number]   Route: Intravitreal, Site: Left Eye, Waste: 0 mg  Post-op Post injection exam found visual acuity of at least counting fingers. The patient tolerated the procedure well. There were no complications. The patient received written and verbal post procedure care education. Post injection medications were not given.                 ASSESSMENT/PLAN:  Exudative age-related macular degeneration of left eye with active choroidal neovascularization (HCC) 8-week follow-up status post intravitreal Avastin OS, will repeat injection today and examination in 10 weeks  Intermediate stage nonexudative age-related macular degeneration of both eyes OD with  some retinal, subfoveal hyper reflective material of drusen or type consistency will need to monitor this      ICD-10-CM   1. Exudative age-related macular degeneration of left eye with active choroidal neovascularization (HCC)  H35.3221 OCT, Retina - OU - Both Eyes    Intravitreal Injection, Pharmacologic Agent - OS - Left Eye    Bevacizumab (  AVASTIN) SOLN 1.25 mg  2. Intermediate stage nonexudative age-related macular degeneration of both eyes  H35.3132   3. Posterior vitreous detachment of both eyes  H43.813   4. Left epiretinal membrane  H35.372     1.OS, stable macular findings on OCT examination at 8-week interval post intravitreal Avastin, will repeat today and examination OS in 10 weeks  2.  3.  Ophthalmic Meds Ordered this visit:  Meds ordered this encounter  Medications  . Bevacizumab (AVASTIN) SOLN 1.25 mg       Return in about 10 weeks (around 05/19/2020) for DILATE OU, AVASTIN OCT, OS.  There are no Patient Instructions on file for this visit.   Explained the diagnoses, plan, and follow up with the patient and they expressed understanding.  Patient expressed understanding of the importance of proper follow up care.   Clent Demark Javier Mamone M.D. Diseases & Surgery of the Retina and Vitreous [facility] [place] 03/10/20     Abbreviations: M myopia (nearsighted); A astigmatism; H hyperopia (farsighted); P presbyopia; Mrx spectacle prescription;  CTL contact lenses; OD right eye; OS left eye; OU both eyes  XT exotropia; ET esotropia; PEK punctate epithelial keratitis; PEE punctate epithelial erosions; DES dry eye syndrome; MGD meibomian gland dysfunction; ATs artificial tears; PFAT's preservative free artificial tears; [place] nuclear sclerotic cataract; PSC posterior subcapsular cataract; ERM epi-retinal membrane; PVD posterior vitreous detachment; RD retinal detachment; DM diabetes mellitus; DR diabetic retinopathy; NPDR non-proliferative diabetic retinopathy; PDR  proliferative diabetic retinopathy; CSME clinically significant macular edema; DME diabetic macular edema; dbh dot blot hemorrhages; CWS cotton wool spot; POAG primary open angle glaucoma; C/D cup-to-disc ratio; HVF humphrey visual field; GVF goldmann visual field; OCT optical coherence tomography; IOP intraocular pressure; BRVO Branch retinal vein occlusion; CRVO central retinal vein occlusion; CRAO central retinal artery occlusion; BRAO branch retinal artery occlusion; RT retinal tear; SB scleral buckle; PPV pars plana vitrectomy; VH Vitreous hemorrhage; PRP panretinal laser photocoagulation; IVK intravitreal kenalog; VMT vitreomacular traction; MH Macular hole;  NVD neovascularization of the disc; NVE neovascularization elsewhere; AREDS age related eye disease study; ARMD age related macular degeneration; POAG primary open angle glaucoma; EBMD epithelial/anterior basement membrane dystrophy; ACIOL anterior chamber intraocular lens; IOL intraocular lens; PCIOL posterior chamber intraocular lens; Phaco/IOL phacoemulsification with intraocular lens placement; [place] photorefractive keratectomy; LASIK laser assisted in situ keratomileusis; HTN hypertension; DM diabetes mellitus; COPD chronic obstructive pulmonary disease

## 2020-03-10 NOTE — Assessment & Plan Note (Signed)
OD with some retinal, subfoveal hyper reflective material of drusen or type consistency will need to monitor this

## 2020-03-22 NOTE — Progress Notes (Signed)
Virtual Visit via Video Note   This visit type was conducted due to national recommendations for restrictions regarding the COVID-19 Pandemic (e.g. social distancing) in an effort to limit this patient's exposure and mitigate transmission in our community.  Due to her co-morbid illnesses, this patient is at least at moderate risk for complications without adequate follow up.  This format is felt to be most appropriate for this patient at this time.  All issues noted in this document were discussed and addressed.  A limited physical exam was performed with this format.  Please refer to the patient's chart for her consent to telehealth for [facility].       Date:  03/23/2020   ID:  [name], DOB 08-03-1926, MRN [id number] The patient was identified using 2 identifiers.  Patient Location: Home Provider Location: Home Office  PCP:  Tally Joe, MD  Cardiologist:  Parke Poisson, MD  Electrophysiologist:  None   Evaluation Performed:  Follow-Up Visit  Chief Complaint:  HFpEF  History of Present Illness:    [name] is a 86 y.o. female with COPD, HTN, and prior hospital admission for cough and shortness of breath, found to have multifactorial dyspnea with COPD and heart failure with preserved ejection fraction requiring IV diuresis. We last had a visit on  09/25/19.  She has overall done well and is fully vaccinated for COVID 19. She has begun working out with her Systems analyst again and is feeling well. No CP. Baseline SOB is stable. No palpitations.   She would like to decrease her dose of lasix, we discussed strategies. She will start taking it every other day and monitor for HF symptoms.   The patient does not have symptoms concerning for COVID-19 infection (fever, chills, cough, or new shortness of breath).    Past Medical History:  Diagnosis Date  . COPD (chronic obstructive pulmonary disease) (HCC)   . Hypertension    No past surgical history on file.    Current Meds  Medication Sig  . cetirizine (ZYRTEC) 5 MG chewable tablet Chew 5 mg by mouth as needed for allergies.  Marland Kitchen diltiazem (CARDIZEM CD) 360 MG 24 hr capsule Take 360 mg by mouth daily.  . metoprolol tartrate (LOPRESSOR) 25 MG tablet Take 12.5 mg by mouth daily.  . Multiple Vitamins-Minerals (PRESERVISION AREDS 2) CAPS Take 1 capsule by mouth 2 (two) times daily.   . OXYGEN 4 lpm with sleep and exercise [employer]  . spironolactone (ALDACTONE) 25 MG tablet Take 0.5 tablets (12.5 mg total) by mouth daily.  . [DISCONTINUED] furosemide (LASIX) 40 MG tablet Take 40 mg by mouth.     Allergies:   Bisoprolol-hydrochlorothiazide, Meperidine, and Tetracycline   Social History   Tobacco Use  . Smoking status: Former Smoker    Packs/day: 0.75    Years: 17.00    Pack years: 12.75    Types: Cigarettes    Quit date: 08/22/1966    Years since quitting: 53.6  . Smokeless tobacco: Former Clinical biochemist  . Vaping Use: Never used  Substance Use Topics  . Alcohol use: Not Currently  . Drug use: Not Currently     Family Hx: The patient's family history is not on file.  ROS:   Please see the history of present illness.     All other systems reviewed and are negative.   Prior CV studies:   The following studies were reviewed today:    Labs/Other Tests and Data Reviewed:  EKG:  No ECG reviewed.  Recent Labs: 05/24/2019: BUN 31; Creatinine, Ser 1.35; Potassium 4.5; Sodium 140   Recent Lipid Panel No results found for: CHOL, TRIG, HDL, CHOLHDL, LDLCALC, LDLDIRECT  Wt Readings from Last 3 Encounters:  03/23/20 174 lb (78.9 kg)  09/25/19 174 lb (78.9 kg)  03/28/19 181 lb (82.1 kg)     Objective:    Vital Signs:  BP 117/68   Pulse 57   Ht 5\' 3"  (1.6 m)   Wt 174 lb (78.9 kg)   BMI 30.82 kg/m    VITAL SIGNS:  reviewed GEN:  no acute distress EYES:  sclerae anicteric, EOMI - Extraocular Movements Intact RESPIRATORY:  normal respiratory effort, symmetric  expansion CARDIOVASCULAR:  no peripheral edema SKIN:  no rash, lesions or ulcers. MUSCULOSKELETAL:  no obvious deformities. NEURO:  alert and oriented x 3, no obvious focal deficit PSYCH:  normal affect  ASSESSMENT & PLAN:    1. Chronic diastolic heart failure (HCC)   2. Essential hypertension   3. Medication management   4. COPD  GOLD I    HFpEF - she would like to decrease her frequency of lasix dosing. Reasonable to go to every other day dosing and monitor for symptoms of volume increase. If she accumulates fluid, would recommend resuming daily lasix dosing. Lasix 40 mg QOD.   HTN - BP stable, continue diltiazem 360 mg daily, metoprolol tartrate 12.5 mg daily, and spironolactone 12.5 mg daily.  COPD - per Dr. , seems stable.   COVID-19 Education: The signs and symptoms of COVID-19 were discussed with the patient and how to seek care for testing (follow up with PCP or arrange E-visit).  The importance of social distancing was discussed today.  Time:   Today, I have spent 20 minutes with the patient with telehealth technology discussing the above problems.     Medication Adjustments/Labs and Tests Ordered: Current medicines are reviewed at length with the patient today.  Concerns regarding medicines are outlined above.   Tests Ordered: No orders of the defined types were placed in this encounter.   Medication Changes: Meds ordered this encounter  Medications  . furosemide (LASIX) 40 MG tablet    Sig: OKAY TO TAKE EVERY OTHER DAY- RESUME TAKING DAILY IF YOU HAVE WORSENING SOB, LEG SWELLING, OR WEIGHT GAIN    Dispense:  90 tablet    Refill:  3    Follow Up:  6 mo virtual  Signed, Sherene Sires, MD  03/23/2020 11:46 AM     [facility]

## 2020-03-23 ENCOUNTER — Encounter: Payer: Self-pay | Admitting: Internal Medicine

## 2020-03-23 ENCOUNTER — Telehealth (INDEPENDENT_AMBULATORY_CARE_PROVIDER_SITE_OTHER): Payer: Medicare PPO | Admitting: Internal Medicine

## 2020-03-23 VITALS — BP 117/68 | HR 57 | Ht 63.0 in | Wt 174.0 lb

## 2020-03-23 DIAGNOSIS — Z79899 Other long term (current) drug therapy: Secondary | ICD-10-CM | POA: Diagnosis not present

## 2020-03-23 DIAGNOSIS — I10 Essential (primary) hypertension: Secondary | ICD-10-CM | POA: Diagnosis not present

## 2020-03-23 DIAGNOSIS — J449 Chronic obstructive pulmonary disease, unspecified: Secondary | ICD-10-CM | POA: Diagnosis not present

## 2020-03-23 DIAGNOSIS — I5032 Chronic diastolic (congestive) heart failure: Secondary | ICD-10-CM | POA: Diagnosis not present

## 2020-03-23 MED ORDER — FUROSEMIDE 40 MG PO TABS: ORAL_TABLET | ORAL | 3 refills | Status: AC

## 2020-03-23 NOTE — Patient Instructions (Signed)
Medication Instructions:  OKAY TO TAKE YOUR LASIX EVERY OTHER DAY- RESUME TAKING IT DAILY IF YOU HAVE WORSENING SHORTNESS OF BREATH, LEG SWELLING, OR WEIGHT GAIN.  *If you need a refill on your cardiac medications before your next appointment, please call your pharmacy*   Follow-Up: At [facility], you and your health needs are our priority.  As part of our continuing mission to provide you with exceptional heart care, we have created designated Provider Care Teams.  These Care Teams include your primary Cardiologist (physician) and Advanced Practice Providers (APPs -  Physician Assistants and Nurse Practitioners) who all work together to provide you with the care you need, when you need it.  We recommend signing up for the patient portal called "MyChart".  Sign up information is provided on this After Visit Summary.  MyChart is used to connect with patients for Virtual Visits (Telemedicine).  Patients are able to view lab/test results, encounter notes, upcoming appointments, etc.  Non-urgent messages can be sent to your provider as well.   To learn more about what you can do with MyChart, go to [email or web address].    Your next appointment:   6 month(s)  The format for your next appointment:   Virtual Visit   Provider:   Weston Brass, MD

## 2020-04-02 DIAGNOSIS — I5043 Acute on chronic combined systolic (congestive) and diastolic (congestive) heart failure: Secondary | ICD-10-CM | POA: Diagnosis not present

## 2020-05-03 DIAGNOSIS — I5043 Acute on chronic combined systolic (congestive) and diastolic (congestive) heart failure: Secondary | ICD-10-CM | POA: Diagnosis not present

## 2020-05-19 ENCOUNTER — Ambulatory Visit (INDEPENDENT_AMBULATORY_CARE_PROVIDER_SITE_OTHER): Payer: Medicare PPO | Admitting: Ophthalmology

## 2020-05-19 ENCOUNTER — Encounter (INDEPENDENT_AMBULATORY_CARE_PROVIDER_SITE_OTHER): Payer: Self-pay | Admitting: Ophthalmology

## 2020-05-19 ENCOUNTER — Other Ambulatory Visit: Payer: Self-pay

## 2020-05-19 DIAGNOSIS — H353132 Nonexudative age-related macular degeneration, bilateral, intermediate dry stage: Secondary | ICD-10-CM

## 2020-05-19 DIAGNOSIS — H353221 Exudative age-related macular degeneration, left eye, with active choroidal neovascularization: Secondary | ICD-10-CM | POA: Diagnosis not present

## 2020-05-19 MED ORDER — BEVACIZUMAB CHEMO INJECTION 1.25MG/0.05ML SYRINGE FOR KALEIDOSCOPE
1.2500 mg | INTRAVITREAL | Status: AC | PRN
  Administered 2020-05-19: 1.25 mg via INTRAVITREAL

## 2020-05-19 NOTE — Assessment & Plan Note (Signed)
OD, with subretinal large soft drusen, will monitor closely.

## 2020-05-19 NOTE — Assessment & Plan Note (Signed)
OS with recurrence of intraretinal fluid and CME at 10-week follow-up interval, repeat injection OS today Avastin and check examination in 10 weeks

## 2020-05-19 NOTE — Progress Notes (Signed)
05/19/2020     CHIEF COMPLAINT Patient presents for Retina Follow Up   HISTORY OF PRESENT ILLNESS: [name] is a 86 y.o. female who presents to the clinic today for:   HPI    Retina Follow Up    Patient presents with  Wet AMD.  In left eye.  Severity is moderate.  Duration of 10 weeks.  Since onset it is stable.  I, the attending physician,  performed the HPI with the patient and updated documentation appropriately.          Comments    10 Week AMD f\u OU. Possible Avastin OS. OCT  Pt states vision has been stable. Denies any complaints.       Last edited by Elyse Jarvis on 05/19/2020  9:41 AM. (History)      Referring physician: Tally Joe, MD [phone] [place],  [place] [address]  HISTORICAL INFORMATION:   Selected notes from the MEDICAL RECORD NUMBER       CURRENT MEDICATIONS: No current outpatient medications on file. (Ophthalmic Drugs)   No current facility-administered medications for this visit. (Ophthalmic Drugs)   Current Outpatient Medications (Other)  Medication Sig  . cetirizine (ZYRTEC) 5 MG chewable tablet Chew 5 mg by mouth as needed for allergies.  Marland Kitchen diltiazem (CARDIZEM CD) 360 MG 24 hr capsule Take 360 mg by mouth daily.  . furosemide (LASIX) 40 MG tablet OKAY TO TAKE EVERY OTHER DAY- RESUME TAKING DAILY IF YOU HAVE WORSENING SOB, LEG SWELLING, OR WEIGHT GAIN  . metoprolol tartrate (LOPRESSOR) 25 MG tablet Take 12.5 mg by mouth daily.  . Multiple Vitamins-Minerals (PRESERVISION AREDS 2) CAPS Take 1 capsule by mouth 2 (two) times daily.   . OXYGEN 4 lpm with sleep and exercise [employer]  . spironolactone (ALDACTONE) 25 MG tablet Take 0.5 tablets (12.5 mg total) by mouth daily.   No current facility-administered medications for this visit. (Other)      REVIEW OF SYSTEMS:    ALLERGIES Allergies  Allergen Reactions  . Bisoprolol-Hydrochlorothiazide Other (See Comments)    Other reaction(s): Cough  (ALLERGY/intolerance)  . Meperidine Nausea And Vomiting  . Tetracycline Hives    PAST MEDICAL HISTORY Past Medical History:  Diagnosis Date  . COPD (chronic obstructive pulmonary disease) (HCC)   . Hypertension    History reviewed. No pertinent surgical history.  FAMILY HISTORY History reviewed. No pertinent family history.  SOCIAL HISTORY Social History   Tobacco Use  . Smoking status: Former Smoker    Packs/day: 0.75    Years: 17.00    Pack years: 12.75    Types: Cigarettes    Quit date: 08/22/1966    Years since quitting: 53.7  . Smokeless tobacco: Former Clinical biochemist  . Vaping Use: Never used  Substance Use Topics  . Alcohol use: Not Currently  . Drug use: Not Currently         OPHTHALMIC EXAM:  Base Eye Exam    Visual Acuity (Snellen - Linear)      Right Left   Dist cc 20/40 -1 20/50 -2   Dist ph cc 20/30 -2 20/50 -1   Correction: Glasses       Tonometry (Tonopen, 9:47 AM)      Right Left   Pressure 15 17       Pupils      Pupils Dark Light Shape React APD   Right PERRL 2 2 Round Sluggish None   Left PERRL 2  2 Round Minimal None       Visual Fields (Counting fingers)      Left Right    Full Full       Neuro/Psych    Oriented x3: Yes   Mood/Affect: Normal       Dilation    Both eyes: 1.0% Mydriacyl, 2.5% Phenylephrine @ 9:47 AM        Slit Lamp and Fundus Exam    External Exam      Right Left   External Normal Normal       Slit Lamp Exam      Right Left   Lids/Lashes Normal Normal   Conjunctiva/Sclera White and quiet White and quiet   Cornea Clear Clear   Anterior Chamber Deep and quiet Deep and quiet   Iris Round and reactive Round and reactive   Lens Posterior chamber intraocular lens Posterior chamber intraocular lens   Anterior Vitreous Normal Normal       Fundus Exam      Right Left   Posterior Vitreous Posterior vitreous detachment Vitreous membranes, strands, Posterior vitreous detachment   Disc Normal Normal,,    C/D Ratio 0.45 0.65   Macula Normal Atrophy, Age related macular degeneration, Early age related macular degeneration, Macular thickening, Retinal pigment epithelial detachment, Membrane, Epiretinal membrane   Vessels Normal Normal   Periphery Normal Normal          IMAGING AND PROCEDURES  Imaging and Procedures for 05/19/20  OCT, Retina - OU - Both Eyes       Right Eye Quality was good. Scan locations included subfoveal. Central Foveal Thickness: 330. Progression has been stable. Findings include no SRF, no IRF, retinal drusen .   Left Eye Quality was good. Scan locations included subfoveal. Central Foveal Thickness: 327. Progression has worsened. Findings include intraretinal fluid, retinal drusen , cystoid macular edema.   Notes OS, with recurrence of intraretinal fluid and CME Secondary to CNVM, at 10-week follow-up interval, will repeat injection OS today and examination in 10 weeks       Intravitreal Injection, Pharmacologic Agent - OS - Left Eye       Time Out 05/19/2020. 11:07 AM. Confirmed correct patient, procedure, site, and patient consented.   Anesthesia Topical anesthesia was used. Anesthetic medications included Akten 3.5%.   Procedure Preparation included 10% betadine to eyelids, 5% betadine to ocular surface, Ofloxacin . A supplied needle was used.   Injection:  1.25 mg Bevacizumab (AVASTIN) SOLN   NDC: [address]-8, Lot: [id number]   Route: Intravitreal, Site: Left Eye, Waste: 0 mg  Post-op Post injection exam found visual acuity of at least counting fingers. The patient tolerated the procedure well. There were no complications. The patient received written and verbal post procedure care education. Post injection medications were not given.                 ASSESSMENT/PLAN:  Intermediate stage nonexudative age-related macular degeneration of both eyes OD, with subretinal large soft drusen, will monitor closely.  Exudative age-related macular  degeneration of left eye with active choroidal neovascularization (HCC) OS with recurrence of intraretinal fluid and CME at 10-week follow-up interval, repeat injection OS today Avastin and check examination in 10 weeks      ICD-10-CM   1. Exudative age-related macular degeneration of left eye with active choroidal neovascularization (HCC)  H35.3221 OCT, Retina - OU - Both Eyes    Intravitreal Injection, Pharmacologic Agent - OS - Left Eye    Bevacizumab (AVASTIN)  SOLN 1.25 mg  2. Intermediate stage nonexudative age-related macular degeneration of both eyes  H35.3132     1.  OS, follow-up today wet AMD, CNVM at 10-week interval.  Repeat injection Avastin today  2.  Follow-up 10 weeks OU, dilate, likely injection intravitreal Avastin OS to maintain  3.  Ophthalmic Meds Ordered this visit:  Meds ordered this encounter  Medications  . Bevacizumab (AVASTIN) SOLN 1.25 mg       Return in about 10 weeks (around 07/28/2020) for DILATE OU, AVASTIN OCT, OS.  There are no Patient Instructions on file for this visit.   Explained the diagnoses, plan, and follow up with the patient and they expressed understanding.  Patient expressed understanding of the importance of proper follow up care.   Alford Highland Maeby Vankleeck M.D. Diseases & Surgery of the Retina and Vitreous [facility] 05/19/20     Abbreviations: M myopia (nearsighted); A astigmatism; H hyperopia (farsighted); P presbyopia; Mrx spectacle prescription;  CTL contact lenses; OD right eye; OS left eye; OU both eyes  XT exotropia; ET esotropia; PEK punctate epithelial keratitis; PEE punctate epithelial erosions; DES dry eye syndrome; MGD meibomian gland dysfunction; ATs artificial tears; PFAT's preservative free artificial tears; NSC nuclear sclerotic cataract; PSC posterior subcapsular cataract; ERM epi-retinal membrane; PVD posterior vitreous detachment; RD retinal detachment; DM diabetes mellitus; DR diabetic retinopathy;  NPDR non-proliferative diabetic retinopathy; PDR proliferative diabetic retinopathy; CSME clinically significant macular edema; DME diabetic macular edema; dbh dot blot hemorrhages; CWS cotton wool spot; POAG primary open angle glaucoma; C/D cup-to-disc ratio; HVF humphrey visual field; GVF goldmann visual field; OCT optical coherence tomography; IOP intraocular pressure; BRVO Branch retinal vein occlusion; CRVO central retinal vein occlusion; CRAO central retinal artery occlusion; BRAO branch retinal artery occlusion; RT retinal tear; SB scleral buckle; PPV pars plana vitrectomy; VH Vitreous hemorrhage; PRP panretinal laser photocoagulation; IVK intravitreal kenalog; VMT vitreomacular traction; MH Macular hole;  NVD neovascularization of the disc; NVE neovascularization elsewhere; AREDS age related eye disease study; ARMD age related macular degeneration; POAG primary open angle glaucoma; EBMD epithelial/anterior basement membrane dystrophy; ACIOL anterior chamber intraocular lens; IOL intraocular lens; PCIOL posterior chamber intraocular lens; Phaco/IOL phacoemulsification with intraocular lens placement; PRK photorefractive keratectomy; LASIK laser assisted in situ keratomileusis; HTN hypertension; DM diabetes mellitus; COPD chronic obstructive pulmonary disease

## 2020-06-02 DIAGNOSIS — I5043 Acute on chronic combined systolic (congestive) and diastolic (congestive) heart failure: Secondary | ICD-10-CM | POA: Diagnosis not present

## 2020-07-03 DIAGNOSIS — I5043 Acute on chronic combined systolic (congestive) and diastolic (congestive) heart failure: Secondary | ICD-10-CM | POA: Diagnosis not present

## 2020-07-21 ENCOUNTER — Other Ambulatory Visit: Payer: Self-pay

## 2020-07-21 ENCOUNTER — Encounter (INDEPENDENT_AMBULATORY_CARE_PROVIDER_SITE_OTHER): Payer: Self-pay | Admitting: Ophthalmology

## 2020-07-21 ENCOUNTER — Ambulatory Visit (INDEPENDENT_AMBULATORY_CARE_PROVIDER_SITE_OTHER): Payer: Medicare PPO | Admitting: Ophthalmology

## 2020-07-21 DIAGNOSIS — H35372 Puckering of macula, left eye: Secondary | ICD-10-CM

## 2020-07-21 DIAGNOSIS — H353221 Exudative age-related macular degeneration, left eye, with active choroidal neovascularization: Secondary | ICD-10-CM

## 2020-07-21 DIAGNOSIS — H353132 Nonexudative age-related macular degeneration, bilateral, intermediate dry stage: Secondary | ICD-10-CM

## 2020-07-21 DIAGNOSIS — H43813 Vitreous degeneration, bilateral: Secondary | ICD-10-CM

## 2020-07-21 MED ORDER — BEVACIZUMAB CHEMO INJECTION 1.25MG/0.05ML SYRINGE FOR KALEIDOSCOPE
1.2500 mg | INTRAVITREAL | Status: AC | PRN
  Administered 2020-07-21: 1.25 mg via INTRAVITREAL

## 2020-07-21 NOTE — Patient Instructions (Signed)
Patient instructed to contact the office promptly for new onset visual acuity declines or distortions 

## 2020-07-21 NOTE — Assessment & Plan Note (Signed)

## 2020-07-21 NOTE — Assessment & Plan Note (Signed)
Improved with less intraretinal fluid currently on 9-week follow-up interval post Avastin, repeat injection today and examination again in 9 weeks

## 2020-07-21 NOTE — Assessment & Plan Note (Signed)
Macular pucker left eye with no impact on acuity, will observe

## 2020-07-21 NOTE — Progress Notes (Signed)
07/21/2020     CHIEF COMPLAINT Patient presents for Retina Follow Up   HISTORY OF PRESENT ILLNESS: [name] is a 86 y.o. female who presents to the clinic today for:   HPI    Retina Follow Up    Patient presents with  Wet AMD.  In left eye.  This started 9 weeks ago.  Severity is mild.  Duration of 9 weeks.  Since onset it is stable.          Comments    9 Week AMD F/U OU, poss Avastin OS  Pt denies noticeable changes to [place] OU since last visit. Pt denies ocular pain, flashes of light, or floaters OU.         Last edited by Ileana Roup, COA on 07/21/2020 10:32 AM. (History)      Referring physician: Tally Joe, MD [phone] [place],  [place] [address]  HISTORICAL INFORMATION:   Selected notes from the MEDICAL RECORD NUMBER       CURRENT MEDICATIONS: No current outpatient medications on file. (Ophthalmic Drugs)   No current facility-administered medications for this visit. (Ophthalmic Drugs)   Current Outpatient Medications (Other)  Medication Sig  . cetirizine (ZYRTEC) 5 MG chewable tablet Chew 5 mg by mouth as needed for allergies.  Marland Kitchen diltiazem (CARDIZEM CD) 360 MG 24 hr capsule Take 360 mg by mouth daily.  . furosemide (LASIX) 40 MG tablet OKAY TO TAKE EVERY OTHER DAY- RESUME TAKING DAILY IF YOU HAVE WORSENING SOB, LEG SWELLING, OR WEIGHT GAIN  . metoprolol tartrate (LOPRESSOR) 25 MG tablet Take 12.5 mg by mouth daily.  . Multiple Vitamins-Minerals (PRESERVISION AREDS 2) CAPS Take 1 capsule by mouth 2 (two) times daily.   . OXYGEN 4 lpm with sleep and exercise [employer]  . spironolactone (ALDACTONE) 25 MG tablet Take 0.5 tablets (12.5 mg total) by mouth daily.   No current facility-administered medications for this visit. (Other)      REVIEW OF SYSTEMS:    ALLERGIES Allergies  Allergen Reactions  . Bisoprolol-Hydrochlorothiazide Other (See Comments)    Other reaction(s): Cough (ALLERGY/intolerance)  . Meperidine Nausea And  Vomiting  . Tetracycline Hives    PAST MEDICAL HISTORY Past Medical History:  Diagnosis Date  . COPD (chronic obstructive pulmonary disease) (HCC)   . Hypertension    History reviewed. No pertinent surgical history.  FAMILY HISTORY History reviewed. No pertinent family history.  SOCIAL HISTORY Social History   Tobacco Use  . Smoking status: Former Smoker    Packs/day: 0.75    Years: 17.00    Pack years: 12.75    Types: Cigarettes    Quit date: 08/22/1966    Years since quitting: 53.9  . Smokeless tobacco: Former Clinical biochemist  . Vaping Use: Never used  Substance Use Topics  . Alcohol use: Not Currently  . Drug use: Not Currently         OPHTHALMIC EXAM:  Base Eye Exam    Visual Acuity (ETDRS)      Right Left   Dist cc 20/40 +1 20/60 +2   Dist ph cc NI 20/50 +2   Correction: Glasses       Tonometry (Tonopen, 10:33 AM)      Right Left   Pressure 16 22       Pupils      Pupils Dark Light Shape React APD   Right PERRL 2 2 Round Minimal None   Left PERRL 2 2 Round  Minimal None       Visual Fields (Counting fingers)      Left Right    Full Full       Extraocular Movement      Right Left    Full Full       Neuro/Psych    Oriented x3: Yes   Mood/Affect: Normal       Dilation    Both eyes: 1.0% Mydriacyl, 2.5% Phenylephrine @ 10:36 AM        Slit Lamp and Fundus Exam    External Exam      Right Left   External Normal Normal       Slit Lamp Exam      Right Left   Lids/Lashes Normal Normal   Conjunctiva/Sclera White and quiet White and quiet   Cornea Clear Clear   Anterior Chamber Deep and quiet Deep and quiet   Iris Round and reactive Round and reactive   Lens Posterior chamber intraocular lens Posterior chamber intraocular lens   Anterior Vitreous Normal Normal       Fundus Exam      Right Left   Posterior Vitreous Posterior vitreous detachment Vitreous membranes, strands, Posterior vitreous detachment   Disc Normal Normal,,    C/D Ratio 0.45 0.65   Macula Retinal pigment epithelial detachment, Soft drusen, Pigmented atrophy Atrophy, Age related macular degeneration, Early age related macular degeneration, Macular thickening, Retinal pigment epithelial detachment, Membrane, Epiretinal membrane   Vessels Normal Normal   Periphery Normal Normal          IMAGING AND PROCEDURES  Imaging and Procedures for 07/21/20  OCT, Retina - OU - Both Eyes       Right Eye Quality was good. Scan locations included subfoveal. Central Foveal Thickness: 363. Progression has been stable. Findings include pigment epithelial detachment, retinal drusen , abnormal foveal contour, no IRF, no SRF.   Left Eye Quality was good. Scan locations included subfoveal. Central Foveal Thickness: 325. Progression has improved. Findings include intraretinal fluid, abnormal foveal contour, no SRF, subretinal hyper-reflective material, epiretinal membrane.   Notes Large drusenoid pigment epithelial detachment subfoveal OD, no signs of CNVM, OS with epiretinal membrane yet with intraretinal fluid nasal to the fovea improved over time currently 9-week interval Avastin OS       Intravitreal Injection, Pharmacologic Agent - OS - Left Eye       Time Out 07/21/2020. 11:19 AM. Confirmed correct patient, procedure, site, and patient consented.   Anesthesia Topical anesthesia was used. Anesthetic medications included Akten 3.5%.   Procedure Preparation included 10% betadine to eyelids, 5% betadine to ocular surface, Ofloxacin . A supplied needle was used.   Injection:  1.25 mg Bevacizumab (AVASTIN) SOLN   NDC: 70360-001-02, Lot: [id number]   Route: Intravitreal, Site: Left Eye, Waste: 0 mg  Post-op Post injection exam found visual acuity of at least counting fingers. The patient tolerated the procedure well. There were no complications. The patient received written and verbal post procedure care education. Post injection medications were not  given.                 ASSESSMENT/PLAN:  Exudative age-related macular degeneration of left eye with active choroidal neovascularization (HCC) Improved with less intraretinal fluid currently on 9-week follow-up interval post Avastin, repeat injection today and examination again in 9 weeks  Left epiretinal membrane Macular pucker left eye with no impact on acuity, will observe  Posterior vitreous detachment of both eyes   The nature of  posterior vitreous detachment was discussed with the patient as well as its physiology, its age prevalence, and its possible implication regarding retinal breaks and detachment.  An informational brochure was given to the patient.  All the patient's questions were answered.  The patient was asked to return if new or different flashes or floaters develops.   Patient was instructed to contact office immediately if any changes were noticed. I explained to the patient that vitreous inside the eye is similar to jello inside a bowl. As the jello melts it can start to pull away from the bowl, similarly the vitreous throughout our lives can begin to pull away from the retina. That process is called a posterior vitreous detachment. In some cases, the vitreous can tug hard enough on the retina to form a retinal tear. I discussed with the patient the signs and symptoms of a retinal detachment.  Do not rub the eye.  Intermediate stage nonexudative age-related macular degeneration of both eyes The nature of age--related macular degeneration was discussed with the patient as well as the distinction between dry and wet types. Checking an Amsler Grid daily with advice to return immediately should a distortion develop, was given to the patient. The patient 's smoking status now and in the past was determined and advice based on the AREDS study was provided regarding the consumption of antioxidant supplements. AREDS 2 vitamin formulation was recommended. Consumption of dark leafy  vegetables and fresh fruits of various colors was recommended. Treatment modalities for wet macular degeneration particularly the use of intravitreal injections of anti-blood vessel growth factors was discussed with the patient. Avastin, Lucentis, and Eylea are the available options. On occasion, therapy includes the use of photodynamic therapy and thermal laser. Stressed to the patient do not rub eyes.  Patient was advised to check Amsler Grid daily and return immediately if changes are noted. Instructions on using the grid were given to the patient. All patient questions were answered.      ICD-10-CM   1. Exudative age-related macular degeneration of left eye with active choroidal neovascularization (HCC)  H35.3221 OCT, Retina - OU - Both Eyes    Intravitreal Injection, Pharmacologic Agent - OS - Left Eye    Bevacizumab (AVASTIN) SOLN 1.25 mg  2. Intermediate stage nonexudative age-related macular degeneration of both eyes  H35.3132 OCT, Retina - OU - Both Eyes  3. Left epiretinal membrane  H35.372   4. Posterior vitreous detachment of both eyes  H43.813     1.  2.  3.  Ophthalmic Meds Ordered this visit:  Meds ordered this encounter  Medications  . Bevacizumab (AVASTIN) SOLN 1.25 mg       Return in about 9 weeks (around 09/22/2020) for dilate, OS, AVASTIN OCT.  Patient Instructions  Patient instructed to contact the office promptly for new onset visual acuity declines or distortions    Explained the diagnoses, plan, and follow up with the patient and they expressed understanding.  Patient expressed understanding of the importance of proper follow up care.   Alford Highland Tobi Leinweber M.D. Diseases & Surgery of the Retina and Vitreous [facility] 07/21/20     Abbreviations: M myopia (nearsighted); A astigmatism; H hyperopia (farsighted); P presbyopia; Mrx spectacle prescription;  CTL contact lenses; OD right eye; OS left eye; OU both eyes  XT exotropia; ET esotropia;  PEK punctate epithelial keratitis; PEE punctate epithelial erosions; DES dry eye syndrome; MGD meibomian gland dysfunction; ATs artificial tears; PFAT's preservative free artificial tears;  [place] nuclear sclerotic cataract; PSC posterior subcapsular cataract; ERM epi-retinal membrane; PVD posterior vitreous detachment; RD retinal detachment; DM diabetes mellitus; DR diabetic retinopathy; NPDR non-proliferative diabetic retinopathy; PDR proliferative diabetic retinopathy; CSME clinically significant macular edema; DME diabetic macular edema; dbh dot blot hemorrhages; CWS cotton wool spot; POAG primary open angle glaucoma; C/D cup-to-disc ratio; HVF humphrey visual field; GVF goldmann visual field; OCT optical coherence tomography; IOP intraocular pressure; BRVO Branch retinal vein occlusion; CRVO central retinal vein occlusion; CRAO central retinal artery occlusion; BRAO branch retinal artery occlusion; RT retinal tear; SB scleral buckle; PPV pars plana vitrectomy; VH Vitreous hemorrhage; PRP panretinal laser photocoagulation; IVK intravitreal kenalog; VMT vitreomacular traction; MH Macular hole;  NVD neovascularization of the disc; NVE neovascularization elsewhere; AREDS age related eye disease study; ARMD age related macular degeneration; POAG primary open angle glaucoma; EBMD epithelial/anterior basement membrane dystrophy; ACIOL anterior chamber intraocular lens; IOL intraocular lens; PCIOL posterior chamber intraocular lens; Phaco/IOL phacoemulsification with intraocular lens placement; [place] photorefractive keratectomy; LASIK laser assisted in situ keratomileusis; HTN hypertension; DM diabetes mellitus; COPD chronic obstructive pulmonary disease

## 2020-07-21 NOTE — Assessment & Plan Note (Signed)

## 2020-07-28 ENCOUNTER — Encounter (INDEPENDENT_AMBULATORY_CARE_PROVIDER_SITE_OTHER): Payer: Medicare PPO | Admitting: Ophthalmology

## 2020-07-28 DIAGNOSIS — E782 Mixed hyperlipidemia: Secondary | ICD-10-CM | POA: Diagnosis not present

## 2020-07-28 DIAGNOSIS — Z0000 Encounter for general adult medical examination without abnormal findings: Secondary | ICD-10-CM | POA: Diagnosis not present

## 2020-07-28 DIAGNOSIS — Z1389 Encounter for screening for other disorder: Secondary | ICD-10-CM | POA: Diagnosis not present

## 2020-07-28 DIAGNOSIS — M85852 Other specified disorders of bone density and structure, left thigh: Secondary | ICD-10-CM | POA: Diagnosis not present

## 2020-07-28 DIAGNOSIS — N1831 Chronic kidney disease, stage 3a: Secondary | ICD-10-CM | POA: Diagnosis not present

## 2020-07-28 DIAGNOSIS — I10 Essential (primary) hypertension: Secondary | ICD-10-CM | POA: Diagnosis not present

## 2020-07-28 DIAGNOSIS — I5032 Chronic diastolic (congestive) heart failure: Secondary | ICD-10-CM | POA: Diagnosis not present

## 2020-07-28 DIAGNOSIS — E1122 Type 2 diabetes mellitus with diabetic chronic kidney disease: Secondary | ICD-10-CM | POA: Diagnosis not present

## 2020-07-28 DIAGNOSIS — J449 Chronic obstructive pulmonary disease, unspecified: Secondary | ICD-10-CM | POA: Diagnosis not present

## 2020-08-02 DIAGNOSIS — I5043 Acute on chronic combined systolic (congestive) and diastolic (congestive) heart failure: Secondary | ICD-10-CM | POA: Diagnosis not present

## 2020-08-04 DIAGNOSIS — H353221 Exudative age-related macular degeneration, left eye, with active choroidal neovascularization: Secondary | ICD-10-CM | POA: Diagnosis not present

## 2020-08-04 DIAGNOSIS — Z961 Presence of intraocular lens: Secondary | ICD-10-CM | POA: Diagnosis not present

## 2020-08-04 DIAGNOSIS — H524 Presbyopia: Secondary | ICD-10-CM | POA: Diagnosis not present

## 2020-08-04 DIAGNOSIS — H52203 Unspecified astigmatism, bilateral: Secondary | ICD-10-CM | POA: Diagnosis not present

## 2020-08-04 DIAGNOSIS — E119 Type 2 diabetes mellitus without complications: Secondary | ICD-10-CM | POA: Diagnosis not present

## 2020-08-04 DIAGNOSIS — H353112 Nonexudative age-related macular degeneration, right eye, intermediate dry stage: Secondary | ICD-10-CM | POA: Diagnosis not present

## 2020-09-02 DIAGNOSIS — I5043 Acute on chronic combined systolic (congestive) and diastolic (congestive) heart failure: Secondary | ICD-10-CM | POA: Diagnosis not present

## 2020-09-21 ENCOUNTER — Telehealth: Payer: Medicare PPO | Admitting: Internal Medicine

## 2020-09-22 ENCOUNTER — Encounter (INDEPENDENT_AMBULATORY_CARE_PROVIDER_SITE_OTHER): Payer: Self-pay | Admitting: Ophthalmology

## 2020-09-22 ENCOUNTER — Ambulatory Visit (INDEPENDENT_AMBULATORY_CARE_PROVIDER_SITE_OTHER): Payer: Medicare PPO | Admitting: Ophthalmology

## 2020-09-22 ENCOUNTER — Other Ambulatory Visit: Payer: Self-pay

## 2020-09-22 DIAGNOSIS — H35372 Puckering of macula, left eye: Secondary | ICD-10-CM

## 2020-09-22 DIAGNOSIS — H353221 Exudative age-related macular degeneration, left eye, with active choroidal neovascularization: Secondary | ICD-10-CM

## 2020-09-22 MED ORDER — BEVACIZUMAB 2.5 MG/0.1ML IZ SOSY
2.5000 mg | PREFILLED_SYRINGE | INTRAVITREAL | Status: AC | PRN
  Administered 2020-09-22: 2.5 mg via INTRAVITREAL

## 2020-09-22 NOTE — Progress Notes (Signed)
09/22/2020     CHIEF COMPLAINT Patient presents for Retina Follow Up (9 Week Wet AMD f\u OS. Possible Avastin OS. OCT/Pt states vision is stable. Denies new complaints.)   HISTORY OF PRESENT ILLNESS: [name] is a 87 y.o. female who presents to the clinic today for:   HPI    Retina Follow Up    Patient presents with  Wet AMD.  In left eye.  Severity is moderate.  Duration of 9 weeks.  Since onset it is stable.  I, the attending physician,  performed the HPI with the patient and updated documentation appropriately. Additional comments: 9 Week Wet AMD f\u OS. Possible Avastin OS. OCT Pt states vision is stable. Denies new complaints.       Last edited by Elyse Jarvis on 09/22/2020 10:53 AM. (History)      Referring physician: Tally Joe, MD [phone] [place],  [place] [address]  HISTORICAL INFORMATION:   Selected notes from the MEDICAL RECORD NUMBER       CURRENT MEDICATIONS: No current outpatient medications on file. (Ophthalmic Drugs)   No current facility-administered medications for this visit. (Ophthalmic Drugs)   Current Outpatient Medications (Other)  Medication Sig  . cetirizine (ZYRTEC) 5 MG chewable tablet Chew 5 mg by mouth as needed for allergies.  Marland Kitchen diltiazem (CARDIZEM CD) 360 MG 24 hr capsule Take 360 mg by mouth daily.  . furosemide (LASIX) 40 MG tablet OKAY TO TAKE EVERY OTHER DAY- RESUME TAKING DAILY IF YOU HAVE WORSENING SOB, LEG SWELLING, OR WEIGHT GAIN  . metoprolol tartrate (LOPRESSOR) 25 MG tablet Take 12.5 mg by mouth daily.  . Multiple Vitamins-Minerals (PRESERVISION AREDS 2) CAPS Take 1 capsule by mouth 2 (two) times daily.   . OXYGEN 4 lpm with sleep and exercise [employer]  . spironolactone (ALDACTONE) 25 MG tablet Take 0.5 tablets (12.5 mg total) by mouth daily.   No current facility-administered medications for this visit. (Other)      REVIEW OF SYSTEMS:    ALLERGIES Allergies  Allergen Reactions  .  Bisoprolol-Hydrochlorothiazide Other (See Comments)    Other reaction(s): Cough (ALLERGY/intolerance)  . Meperidine Nausea And Vomiting  . Tetracycline Hives    PAST MEDICAL HISTORY Past Medical History:  Diagnosis Date  . COPD (chronic obstructive pulmonary disease) (HCC)   . Hypertension    History reviewed. No pertinent surgical history.  FAMILY HISTORY History reviewed. No pertinent family history.  SOCIAL HISTORY Social History   Tobacco Use  . Smoking status: Former Smoker    Packs/day: 0.75    Years: 17.00    Pack years: 12.75    Types: Cigarettes    Quit date: 08/22/1966    Years since quitting: 54.1  . Smokeless tobacco: Former Clinical biochemist  . Vaping Use: Never used  Substance Use Topics  . Alcohol use: Not Currently  . Drug use: Not Currently         OPHTHALMIC EXAM:  Base Eye Exam    Visual Acuity (Snellen - Linear)      Right Left   Dist cc 20/40 -1 20/80 -1   Dist ph cc NI 20/50 -2       Tonometry (Tonopen, 10:58 AM)      Right Left   Pressure 18 20       Pupils      Pupils Dark Light Shape React APD   Right PERRL 2 2 Round Minimal None   Left PERRL 2 2  Round Minimal None       Visual Fields (Counting fingers)      Left Right    Full Full       Neuro/Psych    Oriented x3: Yes   Mood/Affect: Normal       Dilation    Left eye: 1.0% Mydriacyl, 2.5% Phenylephrine @ 10:58 AM        Slit Lamp and Fundus Exam    External Exam      Right Left   External Normal Normal       Slit Lamp Exam      Right Left   Lids/Lashes Normal Normal   Conjunctiva/Sclera White and quiet White and quiet   Cornea Clear Clear   Anterior Chamber Deep and quiet Deep and quiet   Iris Round and reactive Round and reactive   Lens Posterior chamber intraocular lens Posterior chamber intraocular lens   Anterior Vitreous Normal Normal       Fundus Exam      Right Left   Posterior Vitreous  Vitreous membranes, strands, Posterior vitreous detachment    Disc  Normal,,   C/D Ratio  0.65   Macula  Age related macular degeneration, Early age related macular degeneration, Macular thickening, Retinal pigment epithelial detachment, Membrane, Epiretinal membrane, Soft drusen, Geographic atrophy   Vessels  Normal   Periphery  Normal          IMAGING AND PROCEDURES  Imaging and Procedures for 09/22/20  OCT, Retina - OU - Both Eyes       Right Eye Quality was good. Scan locations included subfoveal. Central Foveal Thickness: 336. Progression has been stable. Findings include pigment epithelial detachment, retinal drusen , abnormal foveal contour, no IRF, no SRF.   Left Eye Quality was good. Scan locations included subfoveal. Central Foveal Thickness: 310. Progression has improved. Findings include intraretinal fluid, abnormal foveal contour, no SRF, subretinal hyper-reflective material, epiretinal membrane.   Notes Large drusenoid pigment epithelial detachment subfoveal OD, no signs of CNVM, OS with epiretinal membrane yet with intraretinal fluid nasal to the fovea improved over time currently 9-week interval Avastin OS.         Intravitreal Injection, Pharmacologic Agent - OS - Left Eye       Time Out 09/22/2020. 11:28 AM. Confirmed correct patient, procedure, site, and patient consented.   Anesthesia Topical anesthesia was used. Anesthetic medications included Akten 3.5%.   Procedure Preparation included 10% betadine to eyelids, 5% betadine to ocular surface, Ofloxacin . A supplied needle was used.   Injection:  2.5 mg Bevacizumab (AVASTIN) 2.5mg /0.55mL SOSY   NDC: 16109-604-54, Lot: [id number]   Route: Intravitreal, Site: Left Eye  Post-op Post injection exam found visual acuity of at least counting fingers. The patient tolerated the procedure well. There were no complications. The patient received written and verbal post procedure care education. Post injection medications were not given.                  ASSESSMENT/PLAN:  Left epiretinal membrane Moderate epiretinal membrane, minor impact on acuity will continue to monitor  Exudative age-related macular degeneration of left eye with active choroidal neovascularization (HCC) Stable as measured by intraretinal fluid accumulation and subretinal thickening. Currently without subretinal fluid but vascularized pigment epithelial detachment is apparent.\  Currently at 9-week follow-up interval post Avastin will repeat injection today and examination in 9 weeks      ICD-10-CM   1. Exudative age-related macular degeneration of left eye with active choroidal neovascularization (  HCC)  H35.3221 OCT, Retina - OU - Both Eyes    Intravitreal Injection, Pharmacologic Agent - OS - Left Eye    bevacizumab (AVASTIN) SOSY 2.5 mg  2. Left epiretinal membrane  H35.372     1. Minor intraretinal fluid remains is persistent from CNVM. We will treat today with intravitreal Avastin OS and examination again in 9 weeks  2. No signs of recurrence of the CNVM OD  3. Epiretinal membrane OS of moderate import  Ophthalmic Meds Ordered this visit:  Meds ordered this encounter  Medications  . bevacizumab (AVASTIN) SOSY 2.5 mg       Return in about 9 weeks (around 11/24/2020) for DILATE OU, AVASTIN OCT, OS.  Patient Instructions  Patient instructed to contact the office promptly for new onset visual acuity declines or distortions    Explained the diagnoses, plan, and follow up with the patient and they expressed understanding.  Patient expressed understanding of the importance of proper follow up care.   Alford Highland Parag Dorton M.D. Diseases & Surgery of the Retina and Vitreous [facility] 09/22/20     Abbreviations: M myopia (nearsighted); A astigmatism; H hyperopia (farsighted); P presbyopia; Mrx spectacle prescription;  CTL contact lenses; OD right eye; OS left eye; OU both eyes  XT exotropia; ET esotropia; PEK punctate epithelial keratitis;  PEE punctate epithelial erosions; DES dry eye syndrome; MGD meibomian gland dysfunction; ATs artificial tears; PFAT's preservative free artificial tears; NSC nuclear sclerotic cataract; PSC posterior subcapsular cataract; ERM epi-retinal membrane; PVD posterior vitreous detachment; RD retinal detachment; DM diabetes mellitus; DR diabetic retinopathy; NPDR non-proliferative diabetic retinopathy; PDR proliferative diabetic retinopathy; CSME clinically significant macular edema; DME diabetic macular edema; dbh dot blot hemorrhages; CWS cotton wool spot; POAG primary open angle glaucoma; C/D cup-to-disc ratio; HVF humphrey visual field; GVF goldmann visual field; OCT optical coherence tomography; IOP intraocular pressure; BRVO Branch retinal vein occlusion; CRVO central retinal vein occlusion; CRAO central retinal artery occlusion; BRAO branch retinal artery occlusion; RT retinal tear; SB scleral buckle; PPV pars plana vitrectomy; VH Vitreous hemorrhage; PRP panretinal laser photocoagulation; IVK intravitreal kenalog; VMT vitreomacular traction; MH Macular hole;  NVD neovascularization of the disc; NVE neovascularization elsewhere; AREDS age related eye disease study; ARMD age related macular degeneration; POAG primary open angle glaucoma; EBMD epithelial/anterior basement membrane dystrophy; ACIOL anterior chamber intraocular lens; IOL intraocular lens; PCIOL posterior chamber intraocular lens; Phaco/IOL phacoemulsification with intraocular lens placement; PRK photorefractive keratectomy; LASIK laser assisted in situ keratomileusis; HTN hypertension; DM diabetes mellitus; COPD chronic obstructive pulmonary disease

## 2020-09-22 NOTE — Assessment & Plan Note (Signed)
Moderate epiretinal membrane, minor impact on acuity will continue to monitor

## 2020-09-22 NOTE — Patient Instructions (Signed)
Patient instructed to contact the office promptly for new onset visual acuity declines or distortions 

## 2020-09-22 NOTE — Assessment & Plan Note (Signed)
Stable as measured by intraretinal fluid accumulation and subretinal thickening. Currently without subretinal fluid but vascularized pigment epithelial detachment is apparent.\  Currently at 9-week follow-up interval post Avastin will repeat injection today and examination in 9 weeks

## 2020-09-24 ENCOUNTER — Other Ambulatory Visit: Payer: Self-pay

## 2020-09-30 ENCOUNTER — Telehealth (INDEPENDENT_AMBULATORY_CARE_PROVIDER_SITE_OTHER): Payer: Medicare PPO | Admitting: Internal Medicine

## 2020-09-30 ENCOUNTER — Encounter: Payer: Self-pay | Admitting: Internal Medicine

## 2020-09-30 VITALS — BP 129/69 | HR 63 | Ht 63.0 in | Wt 168.0 lb

## 2020-09-30 DIAGNOSIS — I272 Pulmonary hypertension, unspecified: Secondary | ICD-10-CM

## 2020-09-30 DIAGNOSIS — I10 Essential (primary) hypertension: Secondary | ICD-10-CM | POA: Diagnosis not present

## 2020-09-30 DIAGNOSIS — J449 Chronic obstructive pulmonary disease, unspecified: Secondary | ICD-10-CM

## 2020-09-30 DIAGNOSIS — Z79899 Other long term (current) drug therapy: Secondary | ICD-10-CM

## 2020-09-30 DIAGNOSIS — I5032 Chronic diastolic (congestive) heart failure: Secondary | ICD-10-CM | POA: Diagnosis not present

## 2020-09-30 NOTE — Patient Instructions (Signed)
Medication Instructions:  No Changes In Medications at this time.  *If you need a refill on your cardiac medications before your next appointment, please call your pharmacy*  Follow-Up: At [facility], you and your health needs are our priority.  As part of our continuing mission to provide you with exceptional heart care, we have created designated Provider Care Teams.  These Care Teams include your primary Cardiologist (physician) and Advanced Practice Providers (APPs -  Physician Assistants and Nurse Practitioners) who all work together to provide you with the care you need, when you need it.  Your next appointment:   6 month(s)  The format for your next appointment:   In Person or Virtual   Provider:   Weston Brass, MD

## 2020-09-30 NOTE — Progress Notes (Signed)
Virtual Visit via Video Note   This visit type was conducted due to national recommendations for restrictions regarding the COVID-19 Pandemic (e.g. social distancing) in an effort to limit this patient's exposure and mitigate transmission in our community.  Due to her co-morbid illnesses, this patient is at least at moderate risk for complications without adequate follow up.  This format is felt to be most appropriate for this patient at this time.  All issues noted in this document were discussed and addressed.  A limited physical exam was performed with this format.  Please refer to the patient's chart for her consent to telehealth for [facility].       Date:  09/30/2020   ID:  [name], DOB [birth year], MRN [id number] The patient was identified using 2 identifiers.  Patient Location: Home Provider Location: Home Office  PCP:  Tally Joe, MD  Cardiologist:  Parke Poisson, MD  Electrophysiologist:  None   Evaluation Performed:  Follow-Up Visit  Chief Complaint:  HFpEF  History of Present Illness:    [name] is a 87 y.o. female with COPD, HTN, and prior hospital admission for cough and shortness of breath, found to have multifactorial dyspnea with COPD and heart failure with preserved ejection fraction requiring IV diuresis. We last had a visit on  03/23/20.   Chronic, stable SOB. Helps to care for her husband who has dementia and is more frail than in previous years. They have 3 daughters who stay with them at 2 week intervals. Incredible family support.   Stays active. No CP. No palpitations. BP well controlled on current regimen. No hypotension.   The patient does not have symptoms concerning for COVID-19 infection (fever, chills, cough, or new shortness of breath).    Past Medical History:  Diagnosis Date  . COPD (chronic obstructive pulmonary disease) (HCC)   . Hypertension    No past surgical history on file.   Current Meds  Medication Sig  .  cetirizine (ZYRTEC) 5 MG chewable tablet Chew 5 mg by mouth as needed for allergies.  Marland Kitchen diltiazem (CARDIZEM CD) 360 MG 24 hr capsule Take 360 mg by mouth daily.  . furosemide (LASIX) 40 MG tablet OKAY TO TAKE EVERY OTHER DAY- RESUME TAKING DAILY IF YOU HAVE WORSENING SOB, LEG SWELLING, OR WEIGHT GAIN  . metoprolol tartrate (LOPRESSOR) 25 MG tablet Take 12.5 mg by mouth daily.  . Multiple Vitamins-Minerals (PRESERVISION AREDS 2) CAPS Take 1 capsule by mouth 2 (two) times daily.   . OXYGEN 4 lpm with sleep and exercise [employer]  . spironolactone (ALDACTONE) 25 MG tablet Take 0.5 tablets (12.5 mg total) by mouth daily.     Allergies:   Bisoprolol-hydrochlorothiazide, Meperidine, and Tetracycline   Social History   Tobacco Use  . Smoking status: Former Smoker    Packs/day: 0.75    Years: 17.00    Pack years: 12.75    Types: Cigarettes    Quit date: 08/22/1966    Years since quitting: 54.1  . Smokeless tobacco: Former Clinical biochemist  . Vaping Use: Never used  Substance Use Topics  . Alcohol use: Not Currently  . Drug use: Not Currently     Family Hx: The patient's family history is not on file.  ROS:   Please see the history of present illness.     All other systems reviewed and are negative.   Prior CV studies:   The following studies were reviewed today:    Labs/Other  Tests and Data Reviewed:    EKG:  No ECG reviewed.  Recent Labs: No results found for requested labs within last 8760 hours.   Recent Lipid Panel No results found for: CHOL, TRIG, HDL, CHOLHDL, LDLCALC, LDLDIRECT  Wt Readings from Last 3 Encounters:  09/30/20 168 lb (76.2 kg)  03/23/20 174 lb (78.9 kg)  09/25/19 174 lb (78.9 kg)     Risk Assessment/Calculations:      Objective:    Vital Signs:  BP 129/69   Pulse 63   Ht 5\' 3"  (1.6 m)   Wt 168 lb (76.2 kg)   BMI 29.76 kg/m    VITAL SIGNS:  reviewed GEN:  no acute distress EYES:  sclerae anicteric, EOMI - Extraocular Movements  Intact RESPIRATORY:  normal respiratory effort, symmetric expansion CARDIOVASCULAR:  no peripheral edema SKIN:  no rash, lesions or ulcers. MUSCULOSKELETAL:  no obvious deformities. NEURO:  alert and oriented x 3, no obvious focal deficit PSYCH:  normal affect  ASSESSMENT & PLAN:    1. Chronic diastolic heart failure (HCC)   2. Essential hypertension   3. COPD  GOLD I   4. Pulmonary HTN (HCC)   5. Medication management    HFpEF - on spironolactone, BB and lasix daily. Doing well with no increase in symptoms. She is trying to limit her exposures and has some difficulty with excursions due to hypoxia. Will use symptom guided testing and follow up strategy, of something changes will plan for repeat echo and further evaluation.   Severe PHTN - likely Group III, perhaps mixed Group II and III. Stable symptoms. RV only mildly dilated with mild dysfunction on last echo. Will monitor clinically. If indicated due to worsening symptoms will consider repeat echo and RHC.   HTN - stable on diltiazem, lasix, spironolactone and metoprolol. Continue current regimen.    COVID-19 Education: The signs and symptoms of COVID-19 were discussed with the patient and how to seek care for testing (follow up with PCP or arrange E-visit).  The importance of social distancing was discussed today.  Time:   Today, I have spent 18 minutes with the patient with telehealth technology discussing the above problems.     Medication Adjustments/Labs and Tests Ordered: Current medicines are reviewed at length with the patient today.  Concerns regarding medicines are outlined above.   Tests Ordered: No orders of the defined types were placed in this encounter.   Medication Changes: No orders of the defined types were placed in this encounter.   Follow Up:  6 mo video visit  Signed, , MD  09/30/2020 9:21 AM    [facility]

## 2020-10-03 DIAGNOSIS — I5043 Acute on chronic combined systolic (congestive) and diastolic (congestive) heart failure: Secondary | ICD-10-CM | POA: Diagnosis not present

## 2020-10-31 DIAGNOSIS — I5043 Acute on chronic combined systolic (congestive) and diastolic (congestive) heart failure: Secondary | ICD-10-CM | POA: Diagnosis not present

## 2020-11-24 ENCOUNTER — Ambulatory Visit (INDEPENDENT_AMBULATORY_CARE_PROVIDER_SITE_OTHER): Payer: Medicare PPO | Admitting: Ophthalmology

## 2020-11-24 ENCOUNTER — Other Ambulatory Visit: Payer: Self-pay

## 2020-11-24 ENCOUNTER — Encounter (INDEPENDENT_AMBULATORY_CARE_PROVIDER_SITE_OTHER): Payer: Self-pay | Admitting: Ophthalmology

## 2020-11-24 DIAGNOSIS — H353132 Nonexudative age-related macular degeneration, bilateral, intermediate dry stage: Secondary | ICD-10-CM

## 2020-11-24 DIAGNOSIS — H35371 Puckering of macula, right eye: Secondary | ICD-10-CM | POA: Diagnosis not present

## 2020-11-24 DIAGNOSIS — H35373 Puckering of macula, bilateral: Secondary | ICD-10-CM

## 2020-11-24 DIAGNOSIS — H353221 Exudative age-related macular degeneration, left eye, with active choroidal neovascularization: Secondary | ICD-10-CM | POA: Diagnosis not present

## 2020-11-24 DIAGNOSIS — H35372 Puckering of macula, left eye: Secondary | ICD-10-CM

## 2020-11-24 MED ORDER — BEVACIZUMAB 2.5 MG/0.1ML IZ SOSY
2.5000 mg | PREFILLED_SYRINGE | INTRAVITREAL | Status: AC | PRN
  Administered 2020-11-24: 2.5 mg via INTRAVITREAL

## 2020-11-24 NOTE — Progress Notes (Signed)
11/24/2020     CHIEF COMPLAINT Patient presents for Retina Follow Up (8 Week Wet AMD f\u. Possible Avastin OS. OCT/Pt states vision is about the same. Denies new floaters and FOL.)   HISTORY OF PRESENT ILLNESS: [name] is a 87 y.o. female who presents to the clinic today for:   HPI    Retina Follow Up    Patient presents with  Wet AMD.  In left eye.  Severity is moderate.  Duration of 8 weeks.  Since onset it is stable.  I, the attending physician,  performed the HPI with the patient and updated documentation appropriately. Additional comments: 8 Week Wet AMD f\u. Possible Avastin OS. OCT Pt states vision is about the same. Denies new floaters and FOL.       Last edited by Elyse Jarvis on 11/24/2020 10:24 AM. (History)      Referring physician: Tally Joe, MD [phone] [address] [place],  [place] [address]  HISTORICAL INFORMATION:   Selected notes from the MEDICAL RECORD NUMBER       CURRENT MEDICATIONS: No current outpatient medications on file. (Ophthalmic Drugs)   No current facility-administered medications for this visit. (Ophthalmic Drugs)   Current Outpatient Medications (Other)  Medication Sig  . cetirizine (ZYRTEC) 5 MG chewable tablet Chew 5 mg by mouth as needed for allergies.  Marland Kitchen diltiazem (CARDIZEM CD) 360 MG 24 hr capsule Take 360 mg by mouth daily.  . furosemide (LASIX) 40 MG tablet OKAY TO TAKE EVERY OTHER DAY- RESUME TAKING DAILY IF YOU HAVE WORSENING SOB, LEG SWELLING, OR WEIGHT GAIN  . metoprolol tartrate (LOPRESSOR) 25 MG tablet Take 12.5 mg by mouth daily.  . Multiple Vitamins-Minerals (PRESERVISION AREDS 2) CAPS Take 1 capsule by mouth 2 (two) times daily.   . OXYGEN 4 lpm with sleep and exercise [employer]  . spironolactone (ALDACTONE) 25 MG tablet Take 0.5 tablets (12.5 mg total) by mouth daily.   No current facility-administered medications for this visit. (Other)      REVIEW OF SYSTEMS:    ALLERGIES Allergies  Allergen  Reactions  . Bisoprolol-Hydrochlorothiazide Other (See Comments)    Other reaction(s): Cough (ALLERGY/intolerance)  . Meperidine Nausea And Vomiting  . Tetracycline Hives    PAST MEDICAL HISTORY Past Medical History:  Diagnosis Date  . COPD (chronic obstructive pulmonary disease) (HCC)   . Hypertension    History reviewed. No pertinent surgical history.  FAMILY HISTORY History reviewed. No pertinent family history.  SOCIAL HISTORY Social History   Tobacco Use  . Smoking status: Former Smoker    Packs/day: 0.75    Years: 17.00    Pack years: 12.75    Types: Cigarettes    Quit date: 08/22/1966    Years since quitting: 54.2  . Smokeless tobacco: Former Clinical biochemist  . Vaping Use: Never used  Substance Use Topics  . Alcohol use: Not Currently  . Drug use: Not Currently         OPHTHALMIC EXAM: Base Eye Exam    Visual Acuity (Snellen - Linear)      Right Left   Dist cc 20/50 +1 20/70 -2   Dist ph cc 20/40 +2 20/60 -2   Correction: Glasses       Tonometry (Tonopen, 10:29 AM)      Right Left   Pressure 16 19       Pupils      Pupils Dark Light Shape React APD   Right PERRL 2  2 Round Minimal None   Left PERRL 2 2 Round Minimal None       Visual Fields (Counting fingers)      Left Right    Full Full       Neuro/Psych    Oriented x3: Yes   Mood/Affect: Normal       Dilation    Both eyes: 1.0% Mydriacyl, 2.5% Phenylephrine @ 10:29 AM        Slit Lamp and Fundus Exam    External Exam      Right Left   External Normal Normal       Slit Lamp Exam      Right Left   Lids/Lashes Normal Normal   Conjunctiva/Sclera White and quiet White and quiet   Cornea Clear Clear   Anterior Chamber Deep and quiet Deep and quiet   Iris Round and reactive Round and reactive   Lens Posterior chamber intraocular lens Posterior chamber intraocular lens   Anterior Vitreous Normal Normal       Fundus Exam      Right Left   Posterior Vitreous Posterior vitreous  detachment Vitreous membranes, strands, Posterior vitreous detachment   Disc Normal Normal,,   C/D Ratio 0.45 0.65   Macula Retinal pigment epithelial detachment, Soft drusen, Pigmented atrophy, Epiretinal membrane, no topographic distortion Age related macular degeneration, Early age related macular degeneration, Macular thickening, Retinal pigment epithelial detachment, Membrane, Epiretinal membrane without topographic distortion, Soft drusen, Geographic atrophy   Vessels Normal Normal   Periphery Normal Normal          IMAGING AND PROCEDURES  Imaging and Procedures for 11/24/20  OCT, Retina - OU - Both Eyes       Right Eye Quality was good. Scan locations included subfoveal. Central Foveal Thickness: 337. Progression has been stable. Findings include pigment epithelial detachment, retinal drusen , abnormal foveal contour, no IRF, no SRF, epiretinal membrane.   Left Eye Quality was good. Scan locations included subfoveal. Central Foveal Thickness: 299. Progression has improved. Findings include intraretinal fluid, abnormal foveal contour, no SRF, subretinal hyper-reflective material, epiretinal membrane.   Notes Large drusenoid pigment epithelial detachment subfoveal OD, no signs of CNVM  , OS with epiretinal membrane yet with intraretinal fluid nasal to the fovea improved over time currently 9-week interval Avastin OS.  Repeat injection OS today and examination again in 10 weeks         Intravitreal Injection, Pharmacologic Agent - OS - Left Eye       Time Out 11/24/2020. 11:03 AM. Confirmed correct patient, procedure, site, and patient consented.   Anesthesia Topical anesthesia was used. Anesthetic medications included Akten 3.5%.   Procedure Preparation included 10% betadine to eyelids, 5% betadine to ocular surface, Ofloxacin . A supplied needle was used.   Injection:  2.5 mg Bevacizumab (AVASTIN) 2.5mg /0.70mL SOSY   NDC: 16967-893-81, Lot: [id number]   Route:  Intravitreal, Site: Left Eye  Post-op Post injection exam found visual acuity of at least counting fingers. The patient tolerated the procedure well. There were no complications. The patient received written and verbal post procedure care education. Post injection medications were not given.                 ASSESSMENT/PLAN:  Left epiretinal membrane The nature of macular pucker (epiretinal membrane ERM) was discussed with the patient as well as threshold criteria for vitrectomy surgery. I explained that in rare cases another surgery is needed to actually remove a second wrinkle should it regrow.  Most often, the epiretinal membrane and underlying wrinkled internal limiting membrane are removed with the first surgery, to accomplish the goals.   If the operative eye is [place] (natural lens still present), cataract surgery is often recommended prior to Vitrectomy. This will enable the retina surgeon to have the best view during surgery and the patient to obtain optimal results in the future. Treatment options were discussed.  Right epiretinal membrane The nature of macular pucker (epiretinal membrane ERM) was discussed with the patient as well as threshold criteria for vitrectomy surgery. I explained that in rare cases another surgery is needed to actually remove a second wrinkle should it regrow.  Most often, the epiretinal membrane and underlying wrinkled internal limiting membrane are removed with the first surgery, to accomplish the goals.   If the operative eye is [place] (natural lens still present), cataract surgery is often recommended prior to Vitrectomy. This will enable the retina surgeon to have the best view during surgery and the patient to obtain optimal results in the future. Treatment options were discussed.  Exudative age-related macular degeneration of left eye with active choroidal neovascularization (HCC) Follow-up today at 9 weeks and overall intraretinal fluid controlled on  intravitreal Avastin.  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 Exudative age-related macular degeneration of left eye with active choroidal neovascularization (HCC)  H35.3221 OCT, Retina - OU - Both Eyes    Intravitreal Injection, Pharmacologic Agent - OS - Left Eye    bevacizumab (AVASTIN) SOSY 2.5 mg  2. Left epiretinal membrane  H35.372   3. Right epiretinal membrane  H35.371   4. Intermediate stage nonexudative age-related macular degeneration of both eyes  H35.3132     1.  Overall macula stable and improved OS on Avastin currently 9-week exam interval.  Repeat today and extend interval examination to 10 weeks  2.  3.  Ophthalmic Meds Ordered this visit:  Meds ordered this encounter  Medications  . bevacizumab (AVASTIN) SOSY 2.5 mg       Return in about 10 weeks (around 02/02/2021) for DILATE OU, AVASTIN OCT, OS.  There are no Patient Instructions on file for this visit.   Explained the diagnoses, plan, and follow up with the patient and they expressed understanding.  Patient expressed understanding of the importance of proper follow up care.   Alford Highland Malissa Slay M.D. Diseases & Surgery of the Retina and Vitreous [facility] 11/24/20     Abbreviations: M myopia (nearsighted); A astigmatism; H hyperopia (farsighted); P presbyopia; Mrx spectacle prescription;  CTL contact lenses; OD right eye; OS left eye; OU both eyes  XT exotropia; ET esotropia; PEK punctate epithelial keratitis; PEE punctate epithelial erosions; DES dry eye syndrome; MGD meibomian gland dysfunction; ATs artificial tears; PFAT's preservative free artificial tears; NSC nuclear sclerotic cataract; PSC posterior subcapsular cataract; ERM epi-retinal membrane; PVD posterior vitreous detachment; RD retinal detachment; DM diabetes mellitus; DR diabetic  retinopathy; NPDR non-proliferative diabetic retinopathy; PDR proliferative diabetic retinopathy; CSME clinically significant macular edema; DME diabetic macular edema; dbh dot blot hemorrhages; CWS cotton wool spot; POAG primary open angle glaucoma; C/D cup-to-disc ratio; HVF humphrey visual field; GVF goldmann visual field; OCT optical coherence tomography; IOP intraocular pressure; BRVO Branch retinal vein occlusion; CRVO central retinal vein occlusion; CRAO central retinal artery occlusion; BRAO branch retinal artery occlusion; RT retinal tear;  SB scleral buckle; PPV pars plana vitrectomy; VH Vitreous hemorrhage; PRP panretinal laser photocoagulation; IVK intravitreal kenalog; VMT vitreomacular traction; MH Macular hole;  NVD neovascularization of the disc; NVE neovascularization elsewhere; AREDS age related eye disease study; ARMD age related macular degeneration; POAG primary open angle glaucoma; EBMD epithelial/anterior basement membrane dystrophy; ACIOL anterior chamber intraocular lens; IOL intraocular lens; PCIOL posterior chamber intraocular lens; Phaco/IOL phacoemulsification with intraocular lens placement; PRK photorefractive keratectomy; LASIK laser assisted in situ keratomileusis; HTN hypertension; DM diabetes mellitus; COPD chronic obstructive pulmonary disease 

## 2020-11-24 NOTE — Assessment & Plan Note (Signed)
Follow-up today at 9 weeks and overall intraretinal fluid controlled on intravitreal Avastin.  We will repeat today and extend interval examination to 10 weeks

## 2020-11-24 NOTE — Assessment & Plan Note (Signed)

## 2020-11-24 NOTE — Assessment & Plan Note (Signed)
No signs of CNVM OD 

## 2020-12-01 DIAGNOSIS — I5043 Acute on chronic combined systolic (congestive) and diastolic (congestive) heart failure: Secondary | ICD-10-CM | POA: Diagnosis not present

## 2020-12-31 DIAGNOSIS — I5043 Acute on chronic combined systolic (congestive) and diastolic (congestive) heart failure: Secondary | ICD-10-CM | POA: Diagnosis not present

## 2021-01-31 DIAGNOSIS — I5043 Acute on chronic combined systolic (congestive) and diastolic (congestive) heart failure: Secondary | ICD-10-CM | POA: Diagnosis not present

## 2021-02-02 ENCOUNTER — Encounter (INDEPENDENT_AMBULATORY_CARE_PROVIDER_SITE_OTHER): Payer: Medicare PPO | Admitting: Ophthalmology

## 2021-02-16 ENCOUNTER — Other Ambulatory Visit: Payer: Self-pay

## 2021-02-16 ENCOUNTER — Encounter (INDEPENDENT_AMBULATORY_CARE_PROVIDER_SITE_OTHER): Payer: Self-pay | Admitting: Ophthalmology

## 2021-02-16 ENCOUNTER — Ambulatory Visit (INDEPENDENT_AMBULATORY_CARE_PROVIDER_SITE_OTHER): Payer: Medicare PPO | Admitting: Ophthalmology

## 2021-02-16 DIAGNOSIS — H35371 Puckering of macula, right eye: Secondary | ICD-10-CM

## 2021-02-16 DIAGNOSIS — H35373 Puckering of macula, bilateral: Secondary | ICD-10-CM

## 2021-02-16 DIAGNOSIS — H353132 Nonexudative age-related macular degeneration, bilateral, intermediate dry stage: Secondary | ICD-10-CM

## 2021-02-16 DIAGNOSIS — H35372 Puckering of macula, left eye: Secondary | ICD-10-CM | POA: Diagnosis not present

## 2021-02-16 DIAGNOSIS — H353221 Exudative age-related macular degeneration, left eye, with active choroidal neovascularization: Secondary | ICD-10-CM

## 2021-02-16 MED ORDER — BEVACIZUMAB 2.5 MG/0.1ML IZ SOSY
2.5000 mg | PREFILLED_SYRINGE | INTRAVITREAL | Status: AC | PRN
  Administered 2021-02-16: 2.5 mg via INTRAVITREAL

## 2021-02-16 NOTE — Progress Notes (Signed)
02/16/2021     CHIEF COMPLAINT Patient presents for Retina Follow Up (10 week fu OU and Avastin OS/Pt states, "My vision seems to be about the same. I am not having any issues that I can tell. My oxygen levels have been low lately."/A1C: 7.0/LBS: Does not check//)   HISTORY OF PRESENT ILLNESS: [name] is a 87 y.o. female who presents to the clinic today for:   HPI     Retina Follow Up           Diagnosis: Wet AMD   Laterality: left eye   Onset: 12 weeks ago   Severity: mild   Duration: 12 weeks   Course: stable   Comments: 10 week fu OU and Avastin OS Pt states, "My vision seems to be about the same. I am not having any issues that I can tell. My oxygen levels have been low lately." A1C: 7.0 LBS: Does not check         Last edited by Demetrios Loll, COA on 02/16/2021  8:51 AM.      Referring physician: Tally Joe, MD [phone] [address],  [place] [address]  HISTORICAL INFORMATION:   Selected notes from the MEDICAL RECORD NUMBER       CURRENT MEDICATIONS: No current outpatient medications on file. (Ophthalmic Drugs)   No current facility-administered medications for this visit. (Ophthalmic Drugs)   Current Outpatient Medications (Other)  Medication Sig   cetirizine (ZYRTEC) 5 MG chewable tablet Chew 5 mg by mouth as needed for allergies.   diltiazem (CARDIZEM CD) 360 MG 24 hr capsule Take 360 mg by mouth daily.   furosemide (LASIX) 40 MG tablet OKAY TO TAKE EVERY OTHER DAY- RESUME TAKING DAILY IF YOU HAVE WORSENING SOB, LEG SWELLING, OR WEIGHT GAIN   metoprolol tartrate (LOPRESSOR) 25 MG tablet Take 12.5 mg by mouth daily.   Multiple Vitamins-Minerals (PRESERVISION AREDS 2) CAPS Take 1 capsule by mouth 2 (two) times daily.    OXYGEN 4 lpm with sleep and exercise [employer]   spironolactone (ALDACTONE) 25 MG tablet Take 0.5 tablets (12.5 mg total) by mouth daily.   No current facility-administered medications for this visit. (Other)       REVIEW OF SYSTEMS:    ALLERGIES Allergies  Allergen Reactions   Bisoprolol-Hydrochlorothiazide Other (See Comments)    Other reaction(s): Cough (ALLERGY/intolerance)   Meperidine Nausea And Vomiting   Tetracycline Hives    PAST MEDICAL HISTORY Past Medical History:  Diagnosis Date   COPD (chronic obstructive pulmonary disease) (HCC)    Hypertension    History reviewed. No pertinent surgical history.  FAMILY HISTORY History reviewed. No pertinent family history.  SOCIAL HISTORY Social History   Tobacco Use   Smoking status: Former    Packs/day: 0.75    Years: 17.00    Pack years: 12.75    Types: Cigarettes    Quit date: 08/22/1966    Years since quitting: 54.5   Smokeless tobacco: Former  Building services engineer Use: Never used  Substance Use Topics   Alcohol use: Not Currently   Drug use: Not Currently         OPHTHALMIC EXAM:  Base Eye Exam     Visual Acuity (ETDRS)       Right Left   Dist cc 20/30 -2 20/60   Dist ph cc NI NI         Tonometry (Tonopen, 8:55 AM)       Right  Left   Pressure 12 14         Pupils       Pupils Dark Light Shape React APD   Right PERRL 2 2 Round Minimal None   Left PERRL 2 2 Round Minimal None         Visual Fields (Counting fingers)       Left Right    Full Full         Extraocular Movement       Right Left    Full Full         Neuro/Psych     Oriented x3: Yes   Mood/Affect: Normal         Dilation     Both eyes: 1.0% Mydriacyl, 2.5% Phenylephrine @ 8:55 AM           Slit Lamp and Fundus Exam     External Exam       Right Left   External Normal Normal         Slit Lamp Exam       Right Left   Lids/Lashes Normal Normal   Conjunctiva/Sclera White and quiet White and quiet   Cornea Clear Clear   Anterior Chamber Deep and quiet Deep and quiet   Iris Round and reactive Round and reactive   Lens Posterior chamber intraocular lens Posterior chamber intraocular lens    Anterior Vitreous Normal Normal         Fundus Exam       Right Left   Posterior Vitreous Posterior vitreous detachment Vitreous membranes, strands, Posterior vitreous detachment   Disc Normal Normal,,   C/D Ratio 0.45 0.65   Macula Retinal pigment epithelial detachment, Soft drusen, Pigmented atrophy, Epiretinal membrane, no topographic distortion Age related macular degeneration, Early age related macular degeneration, Macular thickening, Retinal pigment epithelial detachment, Membrane, Epiretinal membrane without topographic distortion, Soft drusen, Geographic atrophy   Vessels Normal Normal   Periphery Normal Normal            IMAGING AND PROCEDURES  Imaging and Procedures for 02/16/21  OCT, Retina - OU - Both Eyes       Right Eye Quality was good. Scan locations included subfoveal. Central Foveal Thickness: 328. Progression has been stable. Findings include pigment epithelial detachment, retinal drusen , abnormal foveal contour, no IRF, no SRF, epiretinal membrane.   Left Eye Quality was good. Scan locations included subfoveal. Central Foveal Thickness: 333. Progression has improved. Findings include intraretinal fluid, abnormal foveal contour, no SRF, subretinal hyper-reflective material, epiretinal membrane.   Notes Large drusenoid pigment epithelial detachment subfoveal OD, no signs of CNVM  , OS with epiretinal membrane yet with intraretinal fluid nasal to the fovea and increased worsening CME at current 10-week interval examination post injection Avastin OS.  Repeat injection OS today and examination again in 6 weeks       Intravitreal Injection, Pharmacologic Agent - OS - Left Eye       Time Out 02/16/2021. 9:19 AM. Confirmed correct patient, procedure, site, and patient consented.   Anesthesia Topical anesthesia was used. Anesthetic medications included Akten 3.5%.   Procedure Preparation included 10% betadine to eyelids, 5% betadine to ocular  surface, Ofloxacin . A supplied needle was used.   Injection: 2.5 mg bevacizumab 2.5 MG/0.1ML   Route: Intravitreal, Site: Left Eye   NDC: [phone], Lot: [id number]   Post-op Post injection exam found visual acuity of at least counting fingers. The patient tolerated the procedure well. There were  no complications. The patient received written and verbal post procedure care education. Post injection medications were not given.              ASSESSMENT/PLAN:  Exudative age-related macular degeneration of left eye with active choroidal neovascularization (HCC) Today at 10-week interval, increased intraretinal fluid and CME left eye From vascularized PED.  And wet AMD.  We will repeat injection today at 10-week interval and follow-up next in 6 weeks  Coincidentally patient reports needing full-time oxygen supplementation of her long term secondary consequence of previous COVID infection  Left epiretinal membrane Stable not contributing to CME  Right epiretinal membrane Minor OD  Intermediate stage nonexudative age-related macular degeneration of both eyes We will monitor PED subfoveal OD no sign of CNVM     ICD-10-CM   1. Exudative age-related macular degeneration of left eye with active choroidal neovascularization (HCC)  H35.3221 OCT, Retina - OU - Both Eyes    Intravitreal Injection, Pharmacologic Agent - OS - Left Eye    bevacizumab (AVASTIN) SOSY 2.5 mg    2. Left epiretinal membrane  H35.372     3. Right epiretinal membrane  H35.371     4. Intermediate stage nonexudative age-related macular degeneration of both eyes  H35.3132       1.  OS, repeat injection intravitreal Avastin today and shorten interval follow-up next visit  2.  Continue to monitor OD and dilate OU next visit  3.  Ophthalmic Meds Ordered this visit:  Meds ordered this encounter  Medications   bevacizumab (AVASTIN) SOSY 2.5 mg       Return in about 6 weeks (around 03/30/2021) for DILATE OU,  AVASTIN OCT, OS.  There are no Patient Instructions on file for this visit.   Explained the diagnoses, plan, and follow up with the patient and they expressed understanding.  Patient expressed understanding of the importance of proper follow up care.   Alford Highland [name] M.D. Diseases & Surgery of the Retina and Vitreous [facility] 02/16/21     Abbreviations: M myopia (nearsighted); A astigmatism; H hyperopia (farsighted); P presbyopia; Mrx spectacle prescription;  CTL contact lenses; OD right eye; OS left eye; OU both eyes  XT exotropia; ET esotropia; PEK punctate epithelial keratitis; PEE punctate epithelial erosions; DES dry eye syndrome; MGD meibomian gland dysfunction; ATs artificial tears; PFAT's preservative free artificial tears; NSC nuclear sclerotic cataract; PSC posterior subcapsular cataract; ERM epi-retinal membrane; PVD posterior vitreous detachment; RD retinal detachment; DM diabetes mellitus; DR diabetic retinopathy; NPDR non-proliferative diabetic retinopathy; PDR proliferative diabetic retinopathy; CSME clinically significant macular edema; DME diabetic macular edema; dbh dot blot hemorrhages; CWS cotton wool spot; POAG primary open angle glaucoma; C/D cup-to-disc ratio; HVF humphrey visual field; GVF goldmann visual field; OCT optical coherence tomography; IOP intraocular pressure; BRVO Branch retinal vein occlusion; CRVO central retinal vein occlusion; CRAO central retinal artery occlusion; BRAO branch retinal artery occlusion; RT retinal tear; SB scleral buckle; PPV pars plana vitrectomy; VH Vitreous hemorrhage; PRP panretinal laser photocoagulation; IVK intravitreal kenalog; VMT vitreomacular traction; MH Macular hole;  NVD neovascularization of the disc; NVE neovascularization elsewhere; AREDS age related eye disease study; ARMD age related macular degeneration; POAG primary open angle glaucoma; EBMD epithelial/anterior basement membrane dystrophy; ACIOL anterior  chamber intraocular lens; IOL intraocular lens; PCIOL posterior chamber intraocular lens; Phaco/IOL phacoemulsification with intraocular lens placement; PRK photorefractive keratectomy; LASIK laser assisted in situ keratomileusis; HTN hypertension; DM diabetes mellitus; COPD chronic obstructive pulmonary disease

## 2021-02-16 NOTE — Assessment & Plan Note (Signed)
We will monitor PED subfoveal OD no sign of CNVM

## 2021-02-16 NOTE — Assessment & Plan Note (Signed)
Stable not contributing to CME

## 2021-02-16 NOTE — Assessment & Plan Note (Signed)
Minor OD 

## 2021-02-16 NOTE — Assessment & Plan Note (Signed)
Today at 10-week interval, increased intraretinal fluid and CME left eye From vascularized PED.  And wet AMD.  We will repeat injection today at 10-week interval and follow-up next in 6 weeks  Coincidentally patient reports needing full-time oxygen supplementation of her long term secondary consequence of previous COVID infection

## 2021-03-02 DIAGNOSIS — I5043 Acute on chronic combined systolic (congestive) and diastolic (congestive) heart failure: Secondary | ICD-10-CM | POA: Diagnosis not present

## 2021-03-18 DIAGNOSIS — I872 Venous insufficiency (chronic) (peripheral): Secondary | ICD-10-CM | POA: Diagnosis not present

## 2021-03-18 DIAGNOSIS — M85852 Other specified disorders of bone density and structure, left thigh: Secondary | ICD-10-CM | POA: Diagnosis not present

## 2021-03-18 DIAGNOSIS — E782 Mixed hyperlipidemia: Secondary | ICD-10-CM | POA: Diagnosis not present

## 2021-03-18 DIAGNOSIS — N1831 Chronic kidney disease, stage 3a: Secondary | ICD-10-CM | POA: Diagnosis not present

## 2021-03-18 DIAGNOSIS — I10 Essential (primary) hypertension: Secondary | ICD-10-CM | POA: Diagnosis not present

## 2021-03-18 DIAGNOSIS — J449 Chronic obstructive pulmonary disease, unspecified: Secondary | ICD-10-CM | POA: Diagnosis not present

## 2021-03-18 DIAGNOSIS — E1122 Type 2 diabetes mellitus with diabetic chronic kidney disease: Secondary | ICD-10-CM | POA: Diagnosis not present

## 2021-03-18 DIAGNOSIS — I5032 Chronic diastolic (congestive) heart failure: Secondary | ICD-10-CM | POA: Diagnosis not present

## 2021-03-18 DIAGNOSIS — I700 Atherosclerosis of aorta: Secondary | ICD-10-CM | POA: Diagnosis not present

## 2021-03-30 ENCOUNTER — Ambulatory Visit (INDEPENDENT_AMBULATORY_CARE_PROVIDER_SITE_OTHER): Payer: Medicare PPO | Admitting: Ophthalmology

## 2021-03-30 ENCOUNTER — Encounter (INDEPENDENT_AMBULATORY_CARE_PROVIDER_SITE_OTHER): Payer: Self-pay | Admitting: Ophthalmology

## 2021-03-30 ENCOUNTER — Other Ambulatory Visit: Payer: Self-pay

## 2021-03-30 DIAGNOSIS — H35371 Puckering of macula, right eye: Secondary | ICD-10-CM

## 2021-03-30 DIAGNOSIS — H353132 Nonexudative age-related macular degeneration, bilateral, intermediate dry stage: Secondary | ICD-10-CM

## 2021-03-30 DIAGNOSIS — H353221 Exudative age-related macular degeneration, left eye, with active choroidal neovascularization: Secondary | ICD-10-CM | POA: Diagnosis not present

## 2021-03-30 MED ORDER — BEVACIZUMAB 2.5 MG/0.1ML IZ SOSY
2.5000 mg | PREFILLED_SYRINGE | INTRAVITREAL | Status: AC | PRN
  Administered 2021-03-30: 2.5 mg via INTRAVITREAL

## 2021-03-30 NOTE — Progress Notes (Signed)
03/30/2021     CHIEF COMPLAINT Patient presents for Retina Follow Up (6 week fu OU and Avastin OS/Pt states VA OU stable since last visit. Pt denies FOL, floaters, or ocular pain OU. /A1C:6.8/LBS: Unknown/)   HISTORY OF PRESENT ILLNESS: [name] is a 87 y.o. female who presents to the clinic today for:   HPI     Retina Follow Up           Diagnosis: Wet AMD   Laterality: both eyes   Onset: 6 weeks ago   Severity: mild   Duration: 6 weeks   Course: stable   Comments: 6 week fu OU and Avastin OS Pt states VA OU stable since last visit. Pt denies FOL, floaters, or ocular pain OU.  A1C:6.8 LBS: Unknown        Last edited by Demetrios Loll, COA on 03/30/2021  9:09 AM.      Referring physician: Tally Joe, MD [phone] [address] [place],  [place] [address]  HISTORICAL INFORMATION:   Selected notes from the MEDICAL RECORD NUMBER       CURRENT MEDICATIONS: No current outpatient medications on file. (Ophthalmic Drugs)   No current facility-administered medications for this visit. (Ophthalmic Drugs)   Current Outpatient Medications (Other)  Medication Sig   cetirizine (ZYRTEC) 5 MG chewable tablet Chew 5 mg by mouth as needed for allergies.   diltiazem (CARDIZEM CD) 360 MG 24 hr capsule Take 360 mg by mouth daily.   furosemide (LASIX) 40 MG tablet OKAY TO TAKE EVERY OTHER DAY- RESUME TAKING DAILY IF YOU HAVE WORSENING SOB, LEG SWELLING, OR WEIGHT GAIN   metoprolol tartrate (LOPRESSOR) 25 MG tablet Take 12.5 mg by mouth daily.   Multiple Vitamins-Minerals (PRESERVISION AREDS 2) CAPS Take 1 capsule by mouth 2 (two) times daily.    OXYGEN 4 lpm with sleep and exercise [employer]   spironolactone (ALDACTONE) 25 MG tablet Take 0.5 tablets (12.5 mg total) by mouth daily.   No current facility-administered medications for this visit. (Other)      REVIEW OF SYSTEMS:    ALLERGIES Allergies  Allergen Reactions   Bisoprolol-Hydrochlorothiazide Other (See  Comments)    Other reaction(s): Cough (ALLERGY/intolerance)   Meperidine Nausea And Vomiting   Tetracycline Hives    PAST MEDICAL HISTORY Past Medical History:  Diagnosis Date   COPD (chronic obstructive pulmonary disease) (HCC)    Hypertension    History reviewed. No pertinent surgical history.  FAMILY HISTORY History reviewed. No pertinent family history.  SOCIAL HISTORY Social History   Tobacco Use   Smoking status: Former    Packs/day: 0.75    Years: 17.00    Pack years: 12.75    Types: Cigarettes    Quit date: 08/22/1966    Years since quitting: 54.6   Smokeless tobacco: Former  Building services engineer Use: Never used  Substance Use Topics   Alcohol use: Not Currently   Drug use: Not Currently         OPHTHALMIC EXAM:  Base Eye Exam     Visual Acuity (ETDRS)       Right Left   Dist cc 20/40 -1 20/80   Dist ph cc 20/30 20/70 -2         Pupils       Pupils Dark Light Shape React APD   Right PERRL 2 2 Round Minimal None   Left PERRL 2 2 Round Minimal None  Visual Fields (Counting fingers)       Left Right    Full Full         Extraocular Movement       Right Left    Full Full         Neuro/Psych     Oriented x3: Yes   Mood/Affect: Normal         Dilation     Both eyes:            Slit Lamp and Fundus Exam     External Exam       Right Left   External Normal Normal         Slit Lamp Exam       Right Left   Lids/Lashes Normal Normal   Conjunctiva/Sclera White and quiet White and quiet   Cornea Clear Clear   Anterior Chamber Deep and quiet Deep and quiet   Iris Round and reactive Round and reactive   Lens Posterior chamber intraocular lens Posterior chamber intraocular lens   Anterior Vitreous Normal Normal         Fundus Exam       Right Left   Posterior Vitreous Posterior vitreous detachment Vitreous membranes, strands, Posterior vitreous detachment   Disc Normal Normal,,   C/D Ratio 0.45 0.65    Macula Retinal pigment epithelial detachment, Soft drusen, Pigmented atrophy, Epiretinal membrane, no topographic distortion Age related macular degeneration, Early age related macular degeneration, Macular thickening, Retinal pigment epithelial detachment, Membrane, Epiretinal membrane without topographic distortion, Soft drusen, Geographic atrophy   Vessels Normal Normal   Periphery Normal Normal            IMAGING AND PROCEDURES  Imaging and Procedures for 03/30/21  OCT, Retina - OU - Both Eyes       Right Eye Quality was good. Scan locations included subfoveal. Central Foveal Thickness: 342. Progression has been stable. Findings include pigment epithelial detachment, retinal drusen , abnormal foveal contour, no IRF, no SRF, epiretinal membrane, subretinal hyper-reflective material.   Left Eye Quality was good. Scan locations included subfoveal. Central Foveal Thickness: 316. Progression has improved. Findings include intraretinal fluid, abnormal foveal contour, no SRF, subretinal hyper-reflective material, epiretinal membrane.   Notes Large drusenoid pigment epithelial detachment subfoveal OD, no signs of CNVM, slightly increased in thickness, as patient continues with ongoing lung disease sequela post COVID and on chronic oxygen use  , OS with epiretinal membrane yet with intraretinal fluid nasal to the fovea and improved CME at current 6 -week interval examination post injection Avastin OS.  Repeat injection OS today and examination again in 6 weeks       Intravitreal Injection, Pharmacologic Agent - OS - Left Eye       Time Out 03/30/2021. 9:41 AM. Confirmed correct patient, procedure, site, and patient consented.   Anesthesia Topical anesthesia was used. Anesthetic medications included Akten 3.5%.   Procedure Preparation included 10% betadine to eyelids, 5% betadine to ocular surface, Ofloxacin . A supplied needle was used.   Injection: 2.5 mg bevacizumab 2.5  MG/0.1ML   Route: Intravitreal, Site: Left Eye   NDC: [phone], Lot: [id number]   Post-op Post injection exam found visual acuity of at least counting fingers. The patient tolerated the procedure well. There were no complications. The patient received written and verbal post procedure care education. Post injection medications were not given.              ASSESSMENT/PLAN:  Exudative age-related macular degeneration  of left eye with active choroidal neovascularization (HCC) The nature of wet macular degeneration was discussed with the patient.  Forms of therapy reviewed include the use of Anti-VEGF medications injected painlessly into the eye, as well as other possible treatment modalities, including thermal laser therapy. Fellow eye involvement and risks were discussed with the patient. Upon the finding of wet age related macular degeneration, treatment will be offered. The treatment regimen is on a treat as needed basis with the intent to treat if necessary and extend interval of exams when possible. On average 1 out of 6 patients do not need lifetime therapy. However, the risk of recurrent disease is high for a lifetime.  Initially monthly, then periodic, examinations and evaluations will determine whether the next treatment is required on the day of the examination.  OS, vastly improved at shorter follow-up interval with much less intraretinal fluid from CNVM and preservation of acuity post Avastin.  We will repeat Avastin OS today  Some role in lung disease damage post COVID and need for chronic oxygen in macular hypoxia OU.    Intermediate stage nonexudative age-related macular degeneration of both eyes Monitor OD, subretinal subfoveal hyper reflective material, associated with ongoing chronic hypoxia from lung disease, no signs of CNVM at this point however.  Right epiretinal membrane Minor no change     ICD-10-CM   1. Exudative age-related macular degeneration of left eye with  active choroidal neovascularization (HCC)  H35.3221 OCT, Retina - OU - Both Eyes    Intravitreal Injection, Pharmacologic Agent - OS - Left Eye    bevacizumab (AVASTIN) SOSY 2.5 mg    2. Intermediate stage nonexudative age-related macular degeneration of both eyes  H35.3132     3. Right epiretinal membrane  H35.371       1.  OS repeat intravitreal Avastin today at shorter interval.  Macular findings improved at shorter interval.  2.  Please note patient continues on chronic oxygen use for the lung disease complicating recent COVID infection  3.  Ophthalmic Meds Ordered this visit:  Meds ordered this encounter  Medications   bevacizumab (AVASTIN) SOSY 2.5 mg       Return in about 6 weeks (around 05/11/2021) for dilate, OS, AVASTIN OCT.  There are no Patient Instructions on file for this visit.   Explained the diagnoses, plan, and follow up with the patient and they expressed understanding.  Patient expressed understanding of the importance of proper follow up care.   Alford Highland Wofford Stratton M.D. Diseases & Surgery of the Retina and Vitreous [facility] 03/30/21     Abbreviations: M myopia (nearsighted); A astigmatism; H hyperopia (farsighted); P presbyopia; Mrx spectacle prescription;  CTL contact lenses; OD right eye; OS left eye; OU both eyes  XT exotropia; ET esotropia; PEK punctate epithelial keratitis; PEE punctate epithelial erosions; DES dry eye syndrome; MGD meibomian gland dysfunction; ATs artificial tears; PFAT's preservative free artificial tears; NSC nuclear sclerotic cataract; PSC posterior subcapsular cataract; ERM epi-retinal membrane; PVD posterior vitreous detachment; RD retinal detachment; DM diabetes mellitus; DR diabetic retinopathy; NPDR non-proliferative diabetic retinopathy; PDR proliferative diabetic retinopathy; CSME clinically significant macular edema; DME diabetic macular edema; dbh dot blot hemorrhages; CWS cotton wool spot; POAG primary open  angle glaucoma; C/D cup-to-disc ratio; HVF humphrey visual field; GVF goldmann visual field; OCT optical coherence tomography; IOP intraocular pressure; BRVO Branch retinal vein occlusion; CRVO central retinal vein occlusion; CRAO central retinal artery occlusion; BRAO branch retinal artery occlusion; RT retinal tear; SB scleral  buckle; PPV pars plana vitrectomy; VH Vitreous hemorrhage; PRP panretinal laser photocoagulation; IVK intravitreal kenalog; VMT vitreomacular traction; MH Macular hole;  NVD neovascularization of the disc; NVE neovascularization elsewhere; AREDS age related eye disease study; ARMD age related macular degeneration; POAG primary open angle glaucoma; EBMD epithelial/anterior basement membrane dystrophy; ACIOL anterior chamber intraocular lens; IOL intraocular lens; PCIOL posterior chamber intraocular lens; Phaco/IOL phacoemulsification with intraocular lens placement; [place] photorefractive keratectomy; LASIK laser assisted in situ keratomileusis; HTN hypertension; DM diabetes mellitus; COPD chronic obstructive pulmonary disease

## 2021-03-30 NOTE — Assessment & Plan Note (Signed)
The nature of wet macular degeneration was discussed with the patient.  Forms of therapy reviewed include the use of Anti-VEGF medications injected painlessly into the eye, as well as other possible treatment modalities, including thermal laser therapy. Fellow eye involvement and risks were discussed with the patient. Upon the finding of wet age related macular degeneration, treatment will be offered. The treatment regimen is on a treat as needed basis with the intent to treat if necessary and extend interval of exams when possible. On average 1 out of 6 patients do not need lifetime therapy. However, the risk of recurrent disease is high for a lifetime.  Initially monthly, then periodic, examinations and evaluations will determine whether the next treatment is required on the day of the examination.  OS, vastly improved at shorter follow-up interval with much less intraretinal fluid from CNVM and preservation of acuity post Avastin.  We will repeat Avastin OS today  Some role in lung disease damage post COVID and need for chronic oxygen in macular hypoxia OU.

## 2021-03-30 NOTE — Assessment & Plan Note (Signed)
Monitor OD, subretinal subfoveal hyper reflective material, associated with ongoing chronic hypoxia from lung disease, no signs of CNVM at this point however.

## 2021-03-30 NOTE — Assessment & Plan Note (Signed)
Minor no change 

## 2021-03-31 ENCOUNTER — Telehealth: Payer: Medicare PPO | Admitting: Internal Medicine

## 2021-03-31 VITALS — BP 123/66 | Ht 63.0 in | Wt 170.0 lb

## 2021-03-31 DIAGNOSIS — I10 Essential (primary) hypertension: Secondary | ICD-10-CM

## 2021-03-31 DIAGNOSIS — I272 Pulmonary hypertension, unspecified: Secondary | ICD-10-CM | POA: Diagnosis not present

## 2021-03-31 DIAGNOSIS — I5032 Chronic diastolic (congestive) heart failure: Secondary | ICD-10-CM

## 2021-03-31 NOTE — Patient Instructions (Signed)
Medication Instructions:  No Changes In Medications at this time.  *If you need a refill on your cardiac medications before your next appointment, please call your pharmacy*  Follow-Up: At [facility], you and your health needs are our priority.  As part of our continuing mission to provide you with exceptional heart care, we have created designated Provider Care Teams.  These Care Teams include your primary Cardiologist (physician) and Advanced Practice Providers (APPs -  Physician Assistants and Nurse Practitioners) who all work together to provide you with the care you need, when you need it.  Your next appointment:   8 month(s)  The format for your next appointment:   In Person or Virtual   Provider:   Weston Brass, MD

## 2021-03-31 NOTE — Progress Notes (Signed)
Virtual Visit via Video Note   This visit type was conducted due to national recommendations for restrictions regarding the COVID-19 Pandemic (e.g. social distancing) in an effort to limit this patient's exposure and mitigate transmission in our community.  Due to her co-morbid illnesses, this patient is at least at moderate risk for complications without adequate follow up.  This format is felt to be most appropriate for this patient at this time.  All issues noted in this document were discussed and addressed.  A limited physical exam was performed with this format.  Please refer to the patient's chart for her consent to telehealth for [facility].  Date:  03/31/2021   ID:  [name], DOB 07-23-1926, MRN [id number] The patient was identified using 2 identifiers.  Patient Location: Home Provider Location: Home Office  PCP:  Tally Joe, MD  Cardiologist:  Parke Poisson, MD  Electrophysiologist:  None   Evaluation Performed:  Follow-Up Visit  Chief Complaint:  HFpEF  History of Present Illness:    NAKIAH Powers is a 87 y.o. female with COPD, HTN, and prior hospital admission for cough and shortness of breath, found to have multifactorial dyspnea with COPD and heart failure with preserved ejection fraction requiring IV diuresis.   Chronic, stable SOB, she is now on chronic O2. Her husband has passed since our last visit. Incredible family support during this difficult time. Enjoys time with her family and grandchildren.  Stays active as much as she can. No CP. No palpitations. BP well controlled on current regimen. No hypotension.   The patient does not have symptoms concerning for COVID-19 infection (fever, chills, cough, or new shortness of breath).    Past Medical History:  Diagnosis Date   COPD (chronic obstructive pulmonary disease) (HCC)    Hypertension    No past surgical history on file.   Current Meds  Medication Sig   cetirizine (ZYRTEC) 5 MG chewable  tablet Chew 5 mg by mouth as needed for allergies.   diltiazem (CARDIZEM CD) 360 MG 24 hr capsule Take 360 mg by mouth daily.   furosemide (LASIX) 40 MG tablet OKAY TO TAKE EVERY OTHER DAY- RESUME TAKING DAILY IF YOU HAVE WORSENING SOB, LEG SWELLING, OR WEIGHT GAIN   metoprolol tartrate (LOPRESSOR) 25 MG tablet Take 12.5 mg by mouth daily.   Multiple Vitamins-Minerals (PRESERVISION AREDS 2) CAPS Take 1 capsule by mouth 2 (two) times daily.    OXYGEN 4 lpm with sleep and exercise [employer]   spironolactone (ALDACTONE) 25 MG tablet Take 0.5 tablets (12.5 mg total) by mouth daily.     Allergies:   Bisoprolol-hydrochlorothiazide, Meperidine, and Tetracycline   Social History   Tobacco Use   Smoking status: Former    Packs/day: 0.75    Years: 17.00    Pack years: 12.75    Types: Cigarettes    Quit date: 08/22/1966    Years since quitting: 54.6   Smokeless tobacco: Former  Building services engineer Use: Never used  Substance Use Topics   Alcohol use: Not Currently   Drug use: Not Currently     Family Hx: The patient's family history is not on file.  ROS:   Please see the history of present illness.     All other systems reviewed and are negative.   Prior CV studies:   The following studies were reviewed today:    Labs/Other Tests and Data Reviewed:    EKG:  No ECG reviewed.  Recent Labs:  No results found for requested labs within last 8760 hours.   Recent Lipid Panel No results found for: CHOL, TRIG, HDL, CHOLHDL, LDLCALC, LDLDIRECT  Wt Readings from Last 3 Encounters:  03/31/21 170 lb (77.1 kg)  09/30/20 168 lb (76.2 kg)  03/23/20 174 lb (78.9 kg)     Risk Assessment/Calculations:      Objective:    Vital Signs:  BP 123/66   Ht 5\' 3"  (1.6 m)   Wt 170 lb (77.1 kg)   BMI 30.11 kg/m    VITAL SIGNS:  reviewed GEN:  no acute distress EYES:  sclerae anicteric, EOMI - Extraocular Movements Intact RESPIRATORY:  normal respiratory effort, symmetric  expansion CARDIOVASCULAR:  no peripheral edema SKIN:  no rash, lesions or ulcers. MUSCULOSKELETAL:  no obvious deformities. NEURO:  alert and oriented x 3, no obvious focal deficit PSYCH:  normal affect  ASSESSMENT & PLAN:    1. Chronic diastolic heart failure (HCC)   2. Essential hypertension   3. Pulmonary HTN (HCC)     HFpEF - on spironolactone, BB and lasix. Doing well with no increase in symptoms. She is trying to limit her exposures and has some difficulty with excursions due to hypoxia, now on chronic O2. Will use symptom guided testing and follow up strategy, of something changes will plan for repeat echo and further evaluation. She is comfortable with a conservative approach and agrees.  Severe PHTN - likely Group III, perhaps mixed Group II and III. Stable symptoms. RV only mildly dilated with mild dysfunction on last echo. Will monitor clinically. If indicated due to worsening symptoms will consider repeat echo and RHC, though she is not in favor of pursuing further workup at this time.   HTN - stable on diltiazem, lasix, spironolactone and metoprolol. Continue current regimen.    COVID-19 Education: The signs and symptoms of COVID-19 were discussed with the patient and how to seek care for testing (follow up with PCP or arrange E-visit).  The importance of social distancing was discussed today.  Time:   Today, I have spent 20 minutes with the patient with telehealth technology discussing the above problems.     Medication Adjustments/Labs and Tests Ordered: Current medicines are reviewed at length with the patient today.  Concerns regarding medicines are outlined above.   Patient Instructions  Medication Instructions:  No Changes In Medications at this time.  *If you need a refill on your cardiac medications before your next appointment, please call your pharmacy*  Follow-Up: At [facility], you and your health needs are our priority.  As part of our continuing  mission to provide you with exceptional heart care, we have created designated Provider Care Teams.  These Care Teams include your primary Cardiologist (physician) and Advanced Practice Providers (APPs -  Physician Assistants and Nurse Practitioners) who all work together to provide you with the care you need, when you need it.  Your next appointment:   8 month(s)  The format for your next appointment:   In Person or Virtual   Provider:   [facility], MD   Signed, Weston Brass, MD  03/31/2021 10:03 AM    [facility]

## 2021-04-02 DIAGNOSIS — I5043 Acute on chronic combined systolic (congestive) and diastolic (congestive) heart failure: Secondary | ICD-10-CM | POA: Diagnosis not present

## 2021-05-03 DIAGNOSIS — I5043 Acute on chronic combined systolic (congestive) and diastolic (congestive) heart failure: Secondary | ICD-10-CM | POA: Diagnosis not present

## 2021-05-11 ENCOUNTER — Encounter (INDEPENDENT_AMBULATORY_CARE_PROVIDER_SITE_OTHER): Payer: Self-pay | Admitting: Ophthalmology

## 2021-05-11 ENCOUNTER — Ambulatory Visit (INDEPENDENT_AMBULATORY_CARE_PROVIDER_SITE_OTHER): Payer: Medicare PPO | Admitting: Ophthalmology

## 2021-05-11 ENCOUNTER — Other Ambulatory Visit: Payer: Self-pay

## 2021-05-11 DIAGNOSIS — H353221 Exudative age-related macular degeneration, left eye, with active choroidal neovascularization: Secondary | ICD-10-CM | POA: Diagnosis not present

## 2021-05-11 DIAGNOSIS — H35373 Puckering of macula, bilateral: Secondary | ICD-10-CM

## 2021-05-11 DIAGNOSIS — H35372 Puckering of macula, left eye: Secondary | ICD-10-CM | POA: Diagnosis not present

## 2021-05-11 DIAGNOSIS — H35371 Puckering of macula, right eye: Secondary | ICD-10-CM

## 2021-05-11 MED ORDER — BEVACIZUMAB 2.5 MG/0.1ML IZ SOSY
2.5000 mg | PREFILLED_SYRINGE | INTRAVITREAL | Status: AC | PRN
  Administered 2021-05-11: 2.5 mg via INTRAVITREAL

## 2021-05-11 NOTE — Assessment & Plan Note (Signed)
Much improved intraretinal fluid and CME on therapy at 6-week interval as compared to for instance in April 2022

## 2021-05-11 NOTE — Assessment & Plan Note (Signed)
Minor OS no impact on acuity observe

## 2021-05-11 NOTE — Progress Notes (Signed)
05/11/2021     CHIEF COMPLAINT Patient presents for  Chief Complaint  Patient presents with   Retina Follow Up    6 week fu OU and Avastin OS Pt states VA OU stable since last visit. Pt denies FOL, floaters, or ocular pain OU.  A1C:6.8 LBS: Unknown       HISTORY OF PRESENT ILLNESS: [name] is a 87 y.o. female who presents to the clinic today for:   HPI     Retina Follow Up   Patient presents with  Wet AMD.  In left eye.  This started 6 weeks ago.  Severity is mild.  Duration of 6 weeks.  Since onset it is stable. Additional comments: 6 week fu OU and Avastin OS Pt states VA OU stable since last visit. Pt denies FOL, floaters, or ocular pain OU.  A1C:6.8 LBS: Unknown         Comments   6 week fu os oct avastin os. Patient states vision is stable and unchanged since last visit. Denies any new floaters or FOL.       Last edited by Nelva Nay on 05/11/2021  9:02 AM.      Referring physician: Tally Joe, MD [phone] [place],  [place] [address]  HISTORICAL INFORMATION:   Selected notes from the MEDICAL RECORD NUMBER       CURRENT MEDICATIONS: No current outpatient medications on file. (Ophthalmic Drugs)   No current facility-administered medications for this visit. (Ophthalmic Drugs)   Current Outpatient Medications (Other)  Medication Sig   cetirizine (ZYRTEC) 5 MG chewable tablet Chew 5 mg by mouth as needed for allergies.   diltiazem (CARDIZEM CD) 360 MG 24 hr capsule Take 360 mg by mouth daily.   furosemide (LASIX) 40 MG tablet OKAY TO TAKE EVERY OTHER DAY- RESUME TAKING DAILY IF YOU HAVE WORSENING SOB, LEG SWELLING, OR WEIGHT GAIN   metoprolol tartrate (LOPRESSOR) 25 MG tablet Take 12.5 mg by mouth daily.   Multiple Vitamins-Minerals (PRESERVISION AREDS 2) CAPS Take 1 capsule by mouth 2 (two) times daily.    OXYGEN 4 lpm with sleep and exercise [employer]   spironolactone (ALDACTONE) 25 MG tablet Take 0.5 tablets (12.5 mg  total) by mouth daily.   No current facility-administered medications for this visit. (Other)      REVIEW OF SYSTEMS:    ALLERGIES Allergies  Allergen Reactions   Bisoprolol-Hydrochlorothiazide Other (See Comments)    Other reaction(s): Cough (ALLERGY/intolerance)   Meperidine Nausea And Vomiting   Tetracycline Hives    PAST MEDICAL HISTORY Past Medical History:  Diagnosis Date   COPD (chronic obstructive pulmonary disease) (HCC)    Hypertension    History reviewed. No pertinent surgical history.  FAMILY HISTORY History reviewed. No pertinent family history.  SOCIAL HISTORY Social History   Tobacco Use   Smoking status: Former    Packs/day: 0.75    Years: 17.00    Pack years: 12.75    Types: Cigarettes    Quit date: 08/22/1966    Years since quitting: 54.7   Smokeless tobacco: Former  Building services engineer Use: Never used  Substance Use Topics   Alcohol use: Not Currently   Drug use: Not Currently         OPHTHALMIC EXAM:  Base Eye Exam     Visual Acuity (ETDRS)       Right Left   Dist cc 20/30 +2 20/70 -2   Dist ph cc  NI    Correction: Glasses         Tonometry (Tonopen, 9:06 AM)       Right Left   Pressure 12 15         Pupils       Pupils Dark Light React APD   Right PERRL 2 2 Minimal None   Left PERRL 2 2 Minimal None         Extraocular Movement       Right Left    Full Full         Neuro/Psych     Oriented x3: Yes   Mood/Affect: Normal         Dilation     Left eye: 1.0% Mydriacyl, 2.5% Phenylephrine @ 9:06 AM           Slit Lamp and Fundus Exam     External Exam       Right Left   External Normal Normal         Slit Lamp Exam       Right Left   Lids/Lashes Normal Normal   Conjunctiva/Sclera White and quiet White and quiet   Cornea Clear Clear   Anterior Chamber Deep and quiet Deep and quiet   Iris Round and reactive Round and reactive   Lens Posterior chamber intraocular lens Posterior  chamber intraocular lens   Anterior Vitreous Normal Normal         Fundus Exam       Right Left   Posterior Vitreous  Vitreous membranes, strands, Posterior vitreous detachment   Disc  Normal,,   C/D Ratio  0.65   Macula  Age related macular degeneration, Early age related macular degeneration, Macular thickening, Retinal pigment epithelial detachment, Membrane, Epiretinal membrane without topographic distortion, Soft drusen, Geographic atrophy   Vessels  Normal   Periphery  Normal            IMAGING AND PROCEDURES  Imaging and Procedures for 05/11/21  OCT, Retina - OU - Both Eyes       Right Eye Quality was good. Scan locations included subfoveal. Central Foveal Thickness: 326. Progression has been stable. Findings include pigment epithelial detachment, retinal drusen , abnormal foveal contour, no IRF, no SRF, epiretinal membrane, subretinal hyper-reflective material.   Left Eye Quality was good. Scan locations included subfoveal. Central Foveal Thickness: 304. Progression has improved. Findings include intraretinal fluid, abnormal foveal contour, no SRF, subretinal hyper-reflective material, epiretinal membrane.   Notes Large drusenoid pigment epithelial detachment subfoveal OD, no signs of CNVM, slightly increased in thickness, as patient continues with ongoing lung disease sequela post COVID and on chronic oxygen use  , OS with epiretinal membrane yet with intraretinal fluid nasal to the fovea and improved CME at current 6 -week interval examination post injection Avastin OS.  Repeat injection OS today and examination again in 6 weeks, as much improved as compared to April examination       Intravitreal Injection, Pharmacologic Agent - OS - Left Eye       Time Out 05/11/2021. 9:44 AM. Confirmed correct patient, procedure, site, and patient consented.   Anesthesia Topical anesthesia was used. Anesthetic medications included Akten 3.5%.   Procedure Preparation  included 10% betadine to eyelids, 5% betadine to ocular surface, Ofloxacin . A supplied needle was used.   Injection: 2.5 mg bevacizumab 2.5 MG/0.1ML   Route: Intravitreal, Site: Left Eye   NDC: [phone], Lot: [id number]   Post-op Post injection exam found visual acuity  of at least counting fingers. The patient tolerated the procedure well. There were no complications. The patient received written and verbal post procedure care education. Post injection medications included ocuflox.              ASSESSMENT/PLAN:  Left epiretinal membrane Minor OS no impact on acuity observe  Exudative age-related macular degeneration of left eye with active choroidal neovascularization (HCC) Much improved intraretinal fluid and CME on therapy at 6-week interval as compared to for instance in April 2022  Right epiretinal membrane No change over time of the time      ICD-10-CM   1. Exudative age-related macular degeneration of left eye with active choroidal neovascularization (HCC)  H35.3221 OCT, Retina - OU - Both Eyes    Intravitreal Injection, Pharmacologic Agent - OS - Left Eye    bevacizumab (AVASTIN) SOSY 2.5 mg    2. Left epiretinal membrane  H35.372     3. Right epiretinal membrane  H35.371       1.  OS today improved at 6-week interval post injection Avastin.  We will repeat again today and maintain 6-week interval examination due to her history of recurrences multiple occasions in the past  2.  Bilateral epiretinal membrane remains mild impact on acuity  3.  Ophthalmic Meds Ordered this visit:  Meds ordered this encounter  Medications   bevacizumab (AVASTIN) SOSY 2.5 mg       Return in about 6 weeks (around 06/22/2021) for dilate, OS, AVASTIN OCT.  There are no Patient Instructions on file for this visit.   Explained the diagnoses, plan, and follow up with the patient and they expressed understanding.  Patient expressed understanding of the importance of proper  follow up care.   Alford Highland Anelisse Jacobson M.D. Diseases & Surgery of the Retina and Vitreous [facility] 05/11/21     Abbreviations: M myopia (nearsighted); A astigmatism; H hyperopia (farsighted); P presbyopia; Mrx spectacle prescription;  CTL contact lenses; OD right eye; OS left eye; OU both eyes  XT exotropia; ET esotropia; PEK punctate epithelial keratitis; PEE punctate epithelial erosions; DES dry eye syndrome; MGD meibomian gland dysfunction; ATs artificial tears; PFAT's preservative free artificial tears; NSC nuclear sclerotic cataract; PSC posterior subcapsular cataract; ERM epi-retinal membrane; PVD posterior vitreous detachment; RD retinal detachment; DM diabetes mellitus; DR diabetic retinopathy; NPDR non-proliferative diabetic retinopathy; PDR proliferative diabetic retinopathy; CSME clinically significant macular edema; DME diabetic macular edema; dbh dot blot hemorrhages; CWS cotton wool spot; POAG primary open angle glaucoma; C/D cup-to-disc ratio; HVF humphrey visual field; GVF goldmann visual field; OCT optical coherence tomography; IOP intraocular pressure; BRVO Branch retinal vein occlusion; CRVO central retinal vein occlusion; CRAO central retinal artery occlusion; BRAO branch retinal artery occlusion; RT retinal tear; SB scleral buckle; PPV pars plana vitrectomy; VH Vitreous hemorrhage; PRP panretinal laser photocoagulation; IVK intravitreal kenalog; VMT vitreomacular traction; MH Macular hole;  NVD neovascularization of the disc; NVE neovascularization elsewhere; AREDS age related eye disease study; ARMD age related macular degeneration; POAG primary open angle glaucoma; EBMD epithelial/anterior basement membrane dystrophy; ACIOL anterior chamber intraocular lens; IOL intraocular lens; PCIOL posterior chamber intraocular lens; Phaco/IOL phacoemulsification with intraocular lens placement; PRK photorefractive keratectomy; LASIK laser assisted in situ keratomileusis; HTN  hypertension; DM diabetes mellitus; COPD chronic obstructive pulmonary disease

## 2021-05-11 NOTE — Assessment & Plan Note (Addendum)
No change over time of the time

## 2021-05-19 ENCOUNTER — Other Ambulatory Visit: Payer: Self-pay

## 2021-05-19 ENCOUNTER — Ambulatory Visit (INDEPENDENT_AMBULATORY_CARE_PROVIDER_SITE_OTHER): Payer: Medicare PPO

## 2021-05-19 ENCOUNTER — Encounter: Payer: Self-pay | Admitting: Internal Medicine

## 2021-05-19 ENCOUNTER — Ambulatory Visit: Payer: Medicare PPO | Admitting: Internal Medicine

## 2021-05-19 DIAGNOSIS — J449 Chronic obstructive pulmonary disease, unspecified: Secondary | ICD-10-CM

## 2021-05-19 DIAGNOSIS — J9612 Chronic respiratory failure with hypercapnia: Secondary | ICD-10-CM | POA: Diagnosis not present

## 2021-05-19 DIAGNOSIS — J9611 Chronic respiratory failure with hypoxia: Secondary | ICD-10-CM

## 2021-05-19 DIAGNOSIS — R0600 Dyspnea, unspecified: Secondary | ICD-10-CM

## 2021-05-19 DIAGNOSIS — J8410 Pulmonary fibrosis, unspecified: Secondary | ICD-10-CM

## 2021-05-19 DIAGNOSIS — R0609 Other forms of dyspnea: Secondary | ICD-10-CM

## 2021-05-19 NOTE — Progress Notes (Signed)
[name], female    DOB: Aug 19, 1926    MRN: [id number]   Brief patient profile:  82 yowf retired Editor, commissioning Quit smoking 1968 due to voice issues and played tennis into her 80s (early 2000s)  with onset of doe x 2010 with gradual progression to point of MMRC3 = can't walk 100 yards even at a slow pace at a flat grade s stopping due to sob but pfts nl 11/16/17  then acutely worse late feb 2020    Admit date: 10/27/2018 Discharge date: 11/01/2018   CODE STATUS: DNR    Brief/Interim Summary: 87 year old female with history of COPD, hypertension, recent upper respiratory infection presented with worsening shortness of breath, cough and wheezing.  She was admitted with acute hypoxic respiratory failure secondary to CHF exacerbation.  She was treated with IV Lasix and has diuresed a lot.  She feels a little better.  She wants to go home.  She will have to be discharged on oral Lasix along with oxygen via nasal cannula.     Discharge Diagnoses:  Principal Problem:   COPD exacerbation (HCC) Active Problems:   Essential hypertension   Acute respiratory failure (HCC)   Hypomagnesemia   Hypokalemia   Acute hypoxic respiratory failure -Probably from CHF exacerbation -Currently on 3 L oxygen via nasal cannula.    Oxygen saturation drops down to the 80s with ambulation.  She will need to be discharged on oxygen via nasal cannula at 4 L/min for diagnosis of hypoxia. -Will benefit from outpatient pulmonary evaluation.   Acute on chronic diastolic CHF -EF of 55% with severe pulmonary hypertension -Treated with IV Lasix 40 mg every 12 hours with good diuresis.  Negative balance of 7439  cc since admission.  Patient was to go home today.  We will switch to Lasix 40 mg twice a day. Continue metoprolol and spironolactone.  Outpatient follow-up with cardiology.     COPD and questionable UIP on CT scan -Case was discussed with [name] on 10/29/2018 on phone and as per him CT findings  are not consistent with UIP. -It does not seem to be COPD exacerbation as symptoms improved with diuresis.  Steroids have been stopped. -Outpatient follow-up with pulmonary.  We will add Dulera to the regimen.   Severe pulmonary hypertension -Outpatient follow-up with pulmonary       History of Present Illness  01/08/2019  Pulmonary/ 1st office eval/[name]  Chief Complaint  Patient presents with   Pulmonary Consult    referred by [name] for COPD.   Dyspnea:  Room to room slow pace, some better with 02 / doing stationery bike since d/c 15 min / day on bike x 4lpm  Cough: none Sleep: fine in recliner since d/c on 4lpm @ 30 degrees   SABA use: no better on dulera  02 4lpm at bedtime, 4lpm walk No tendency to kidney infections/ chemo/ collagen vasc dz   rec 4lpm is fine for now but we need to do an overnight study to be sure it's the right amount Continue 4lpm with exercise but ideally need to adjust it during the day to keep above 90%  Stop dulera now  Please schedule a follow up office visit in 6 weeks, call sooner if needed try to PFTs    03/20/2019  f/u ov/[name] re: GOLD I copd spirometry  / 02 dep/ cor pulmonale  Chief Complaint  Patient presents with   Follow-up    PFT done on 03/01/19. Her breathing has  improved some.    Dyspnea:  Bicycle x 30 min x 7 d per week and resistance set low, does not check sats  Cough: none Sleeping: at 30-40 degrees in reclining  Chair since last admit   SABA use: none 02: 4lpm hs and ex  Rec Make sure you check your oxygen saturations at highest level of activity   Please schedule a follow up visit in 6 months but call sooner if needed    05/19/2021  f/u ov/[name] re:  PF/GOLD 1  copd spirometry but 02 dep/ cor pulmonale  maint on just 02   Chief Complaint  Patient presents with   Follow-up    Pt states needing oxygen all the time now.   Dyspnea:  not aware of any limitation but very inactive since husband died in February 28, 2021 Cough:  none  Sleeping: 30 degrees slides down in chair  SABA use: never  02: changed to 5 lpm April,  not necessary,  drops to 70 on 5 lpm continuous  Covid status:   vax x 5    No obvious day to day or daytime variability or assoc excess/ purulent sputum or mucus plugs or hemoptysis or cp or chest tightness, subjective wheeze or overt sinus or hb symptoms.   Sleeping as above without nocturnal  or early am exacerbation  of respiratory  c/o's or need for noct saba. Also denies any obvious fluctuation of symptoms with weather or environmental changes or other aggravating or alleviating factors except as outlined above   No unusual exposure hx or h/o childhood pna/ asthma or knowledge of premature birth.  Current Allergies, Complete Past Medical History, Past Surgical History, Family History, and Social History were reviewed in [name] record.  ROS  The following are not active complaints unless bolded Hoarseness, sore throat, dysphagia, dental problems, itching, sneezing,  nasal congestion or discharge of excess mucus or purulent secretions, ear ache,   fever, chills, sweats, unintended wt loss or wt gain, classically pleuritic or exertional cp,  orthopnea pnd or arm/hand swelling  or leg swelling, presyncope, palpitations, abdominal pain, anorexia, nausea, vomiting, diarrhea  or change in bowel habits or change in bladder habits, change in stools or change in urine, dysuria, hematuria,  rash, arthralgias, visual complaints, headache, numbness, weakness or ataxia or problems with walking or coordination,  change in mood or  memory.        Current Meds  Medication Sig   cetirizine (ZYRTEC) 5 MG chewable tablet Chew 5 mg by mouth as needed for allergies.   diltiazem (CARDIZEM CD) 360 MG 24 hr capsule Take 360 mg by mouth daily.   furosemide (LASIX) 40 MG tablet OKAY TO TAKE EVERY OTHER DAY- RESUME TAKING DAILY IF YOU HAVE WORSENING SOB, LEG SWELLING, OR WEIGHT GAIN    metoprolol tartrate (LOPRESSOR) 25 MG tablet Take 12.5 mg by mouth daily.   Multiple Vitamins-Minerals (PRESERVISION AREDS 2) CAPS Take 1 capsule by mouth 2 (two) times daily.    OXYGEN 4 lpm with sleep and exercise [employer]   spironolactone (ALDACTONE) 25 MG tablet Take 0.5 tablets (12.5 mg total) by mouth daily.              Past Medical History:  Diagnosis Date   COPD (chronic obstructive pulmonary disease) (HCC)    Hypertension        Objective:        05/19/2021       168   03/20/19 185 lb (83.9  kg)  01/08/19 187 lb 6.4 oz (85 kg)  12/31/18 175 lb (79.4 kg)      Vital signs reviewed  05/19/2021  - Note at rest 02 sats  96% on 5lpm   General appearance:    chronically ill appearing w/c bound wf nad at rest     HEENT : pt wearing mask not removed for exam due to covid - 19 concerns.   NECK :  without JVD/Nodes/TM/ nl carotid upstrokes bilaterally   LUNGS: no acc muscle use,  mildly kyphotic  barrel  contour chest wall with bilateral  slightly decreased bs s audible wheeze and  without cough on insp or exp maneuvers and min  Hyperresonant  to  percussion bilaterally     CV:  RRR  no s3  with 2/6 SEM s increase in P2, and no edema   ABD:  soft and nontender with pos end  insp Hoover's  in the supine position. No bruits or organomegaly appreciated, bowel sounds nl  MS:     ext warm without deformities, calf tenderness, cyanosis or clubbing No obvious joint restrictions   SKIN: warm and dry without lesions    NEURO:  alert, approp, nl sensorium with  no motor or cerebellar deficits apparent.       CXR PA and Lateral:   05/19/2021 :    I personally reviewed images and agree with radiology impression as follows:    Chronic lung disease with interstitial fibrosis. No superimposed acute abnormality.      Assessment

## 2021-05-19 NOTE — Patient Instructions (Addendum)
Make sure you check your oxygen saturation  at your highest level of activity  to be sure it stays over 90% and adjust  02 flow upward to maintain this level if needed but remember to turn it back to previous settings when you stop (to conserve your supply).    Please remember to go to the x-ray department  for your tests - we will call you with the results when they are available.       Add needs HRCT next

## 2021-05-21 ENCOUNTER — Encounter: Payer: Self-pay | Admitting: Internal Medicine

## 2021-05-21 NOTE — Assessment & Plan Note (Signed)
Dx at admit 10/2018 rx 4lpm 24/7 - HCO3    11/01/18  = 31 (improved)  - 01/08/2019   Walked 4lpm   2 laps @  approx 232ft each @ fast pace  stopped due to  Sob with sats 88% at end  - ONO on 01/15/2019 4lpm  Desat x 40 sec (one event ) > no change rx  - 03/20/2019   Walked RA x one lap =  approx 250 ft - stopped due to  Sob with sats down to 86% at avg pace on 4lpm POC  - 05/19/2021   Walked on 5lpm cont x   125 ft (much farther than her baseline now)   @ slow pace, stopped due to sob with lowest 02 sats 88%   W/u new desats with repeat HRCT, in meantime will need to pace or turn 02 to highest level with goal > 90%  Each maintenance medication was reviewed in detail including emphasizing most importantly the difference between maintenance and prns and under what circumstances the prns are to be triggered using an action plan format where appropriate.  Total time for H and P, chart review, counseling,  directly observing portions of ambulatory 02 saturation study/ and generating customized AVS unique to this office visit / same day charting = 25 min

## 2021-05-21 NOTE — Assessment & Plan Note (Signed)
Quit smoking 1968  - PFT's 11/16/17 at [facility] c/w mild/mod obstruction ? while on stiolto but pt couldn't tell was helping - 01/08/2019 d/c dulera as pt doesn't think it's helping either  - PFT's  03/01/19  FEV1 1.54 (110 % ) ratio 0.62  p 2 % improvement from saba p nothing prior to study with DLCO  64 % corrects to 68  % for alv volume    I don't really believe this is a limiting problem  - As I explained to this patient in detail:  although there may be copd present, it may not be clinically relevant:   it does not appear to be limiting activity tolerance  And because this pt is so sedentary I don't recommend aggressive pulmonary rx at this point unless limiting symptoms arise or acute exacerbations become as issue, neither of which is the case now.

## 2021-05-21 NOTE — Assessment & Plan Note (Signed)
hrct non dx 10/27/2018 - rec repeat HCT

## 2021-06-02 DIAGNOSIS — I5043 Acute on chronic combined systolic (congestive) and diastolic (congestive) heart failure: Secondary | ICD-10-CM | POA: Diagnosis not present

## 2021-06-04 ENCOUNTER — Telehealth: Payer: Self-pay | Admitting: *Deleted

## 2021-06-04 ENCOUNTER — Other Ambulatory Visit: Payer: Self-pay | Admitting: Internal Medicine

## 2021-06-04 ENCOUNTER — Telehealth: Payer: Self-pay | Admitting: Internal Medicine

## 2021-06-04 DIAGNOSIS — J8410 Pulmonary fibrosis, unspecified: Secondary | ICD-10-CM
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
## 2021-06-04 NOTE — Telephone Encounter (Signed)
Spoke with the pt and notified of recs per MW  She was fine with ordering the ct  I placed order

## 2021-06-04 NOTE — Telephone Encounter (Signed)
-----   Message from Nyoka Cowden, MD sent at 05/21/2021  1:37 PM EDT ----- After chart review, rec HRCT  f/u PF when convenient

## 2021-06-07 NOTE — Telephone Encounter (Signed)
  Anna Cowden, MD to Me     4:26 PM Fine with me   Spoke with the pt and she stated that she does not want the extra o2 from [facility]. She wants to stick with just getting o2 from [employer]. Nothing further needed per pt.

## 2021-06-22 ENCOUNTER — Ambulatory Visit (INDEPENDENT_AMBULATORY_CARE_PROVIDER_SITE_OTHER): Payer: Medicare PPO | Admitting: Ophthalmology

## 2021-06-22 ENCOUNTER — Encounter (INDEPENDENT_AMBULATORY_CARE_PROVIDER_SITE_OTHER): Payer: Self-pay | Admitting: Ophthalmology

## 2021-06-22 ENCOUNTER — Other Ambulatory Visit: Payer: Self-pay

## 2021-06-22 DIAGNOSIS — H353221 Exudative age-related macular degeneration, left eye, with active choroidal neovascularization: Secondary | ICD-10-CM | POA: Diagnosis not present

## 2021-06-22 DIAGNOSIS — H35372 Puckering of macula, left eye: Secondary | ICD-10-CM | POA: Diagnosis not present

## 2021-06-22 MED ORDER — BEVACIZUMAB 2.5 MG/0.1ML IZ SOSY
2.5000 mg | PREFILLED_SYRINGE | INTRAVITREAL | Status: AC | PRN
  Administered 2021-06-22: 2.5 mg via INTRAVITREAL

## 2021-06-22 NOTE — Progress Notes (Signed)
06/22/2021     CHIEF COMPLAINT Patient presents for  Chief Complaint  Patient presents with   Retina Follow Up      HISTORY OF PRESENT ILLNESS: [name] is a 87 y.o. female who presents to the clinic today for:   HPI     Retina Follow Up   Patient presents with  Wet AMD.  In left eye.  This started 6 weeks ago.  Duration of 6 weeks.  Since onset it is stable.      Last edited by Frederik Pear, COA on 06/22/2021  9:04 AM.      Referring physician: Tally Joe, MD [phone] [address] [place],  [place] [address]  HISTORICAL INFORMATION:   Selected notes from the MEDICAL RECORD NUMBER       CURRENT MEDICATIONS: No current outpatient medications on file. (Ophthalmic Drugs)   No current facility-administered medications for this visit. (Ophthalmic Drugs)   Current Outpatient Medications (Other)  Medication Sig   cetirizine (ZYRTEC) 5 MG chewable tablet Chew 5 mg by mouth as needed for allergies.   diltiazem (CARDIZEM CD) 360 MG 24 hr capsule Take 360 mg by mouth daily.   furosemide (LASIX) 40 MG tablet OKAY TO TAKE EVERY OTHER DAY- RESUME TAKING DAILY IF YOU HAVE WORSENING SOB, LEG SWELLING, OR WEIGHT GAIN   metoprolol tartrate (LOPRESSOR) 25 MG tablet Take 12.5 mg by mouth daily.   Multiple Vitamins-Minerals (PRESERVISION AREDS 2) CAPS Take 1 capsule by mouth 2 (two) times daily.    OXYGEN 4 lpm with sleep and exercise [employer]   spironolactone (ALDACTONE) 25 MG tablet Take 0.5 tablets (12.5 mg total) by mouth daily.   No current facility-administered medications for this visit. (Other)      REVIEW OF SYSTEMS:    ALLERGIES Allergies  Allergen Reactions   Bisoprolol-Hydrochlorothiazide Other (See Comments)    Other reaction(s): Cough (ALLERGY/intolerance)   Meperidine Nausea And Vomiting   Tetracycline Hives    PAST MEDICAL HISTORY Past Medical History:  Diagnosis Date   COPD (chronic obstructive pulmonary disease) (HCC)    Hypertension     History reviewed. No pertinent surgical history.  FAMILY HISTORY History reviewed. No pertinent family history.  SOCIAL HISTORY Social History   Tobacco Use   Smoking status: Former    Packs/day: 0.75    Years: 17.00    Pack years: 12.75    Types: Cigarettes    Quit date: 08/22/1966    Years since quitting: 54.8   Smokeless tobacco: Former  Building services engineer Use: Never used  Substance Use Topics   Alcohol use: Not Currently   Drug use: Not Currently         OPHTHALMIC EXAM:  Base Eye Exam     Visual Acuity (ETDRS)       Right Left   Dist cc 20/40 -2 20/200 -1   Dist ph cc 20/30 -1 NI    Correction: Glasses         Tonometry (Tonopen, 9:10 AM)       Right Left   Pressure 18 22         Pupils       Pupils Dark Light Shape React APD   Right PERRL 2 2 Round Minimal None   Left PERRL 2 2 Round Minimal None         Visual Fields       Left Right    Full Full  Extraocular Movement       Right Left    Full, Ortho Full, Ortho         Neuro/Psych     Oriented x3: Yes   Mood/Affect: Normal         Dilation     Left eye: 1.0% Mydriacyl, 2.5% Phenylephrine @ 9:10 AM           Slit Lamp and Fundus Exam     External Exam       Right Left   External Normal Normal         Slit Lamp Exam       Right Left   Lids/Lashes Normal Normal   Conjunctiva/Sclera White and quiet White and quiet   Cornea Clear Clear   Anterior Chamber Deep and quiet Deep and quiet   Iris Round and reactive Round and reactive   Lens Posterior chamber intraocular lens Posterior chamber intraocular lens   Anterior Vitreous Normal Central vitreous floaters         Fundus Exam       Right Left   Posterior Vitreous  Vitreous membranes, strands, Posterior vitreous detachment   Disc  Normal,,   C/D Ratio  0.65   Macula  Age related macular degeneration, Early age related macular degeneration, Macular thickening, Retinal pigment epithelial  detachment, Membrane, Epiretinal membrane without topographic distortion, Soft drusen, Geographic atrophy   Vessels  Normal   Periphery  Normal            IMAGING AND PROCEDURES  Imaging and Procedures for 06/22/21  OCT, Retina - OU - Both Eyes       Right Eye Quality was good. Scan locations included subfoveal. Central Foveal Thickness: 329. Progression has been stable. Findings include pigment epithelial detachment, retinal drusen , abnormal foveal contour, no IRF, no SRF, epiretinal membrane, subretinal hyper-reflective material.   Left Eye Quality was good. Scan locations included subfoveal. Central Foveal Thickness: 306. Progression has improved. Findings include intraretinal fluid, abnormal foveal contour, no SRF, subretinal hyper-reflective material, epiretinal membrane.   Notes Large drusenoid pigment epithelial detachment subfoveal OD, no signs of CNVM, slightly increased in thickness, as patient continues with ongoing lung disease sequela post COVID and on chronic oxygen use  , OS with epiretinal membrane yet with intraretinal fluid nasal to the fovea and improved CME at current 6 -week interval examination post injection Avastin OS.  Repeat injection OS today and examination again in 6-7 weeks, as much improved as compared to April examination       Intravitreal Injection, Pharmacologic Agent - OS - Left Eye       Time Out 06/22/2021. 9:39 AM. Confirmed correct patient, procedure, site, and patient consented.   Anesthesia Topical anesthesia was used. Anesthetic medications included Akten 3.5%, Lidocaine 4%.   Procedure Preparation included 10% betadine to eyelids, 5% betadine to ocular surface, Ofloxacin . A supplied needle was used.   Injection: 2.5 mg bevacizumab 2.5 MG/0.1ML   Route: Intravitreal, Site: Left Eye   NDC: [phone], Lot: [id number]   Post-op Post injection exam found visual acuity of at least counting fingers. The patient tolerated the  procedure well. There were no complications. The patient received written and verbal post procedure care education. Post injection medications included ocuflox.              ASSESSMENT/PLAN:  Left epiretinal membrane Minor OS no impact on acuity  Exudative age-related macular degeneration of left eye with active choroidal neovascularization (HCC) OS, most  recent disease activation February 16, 2021, improved macular findings and OCT findings at 6-week interval post Avastin.  Repeat injection today and maintain 6-week interval     ICD-10-CM   1. Exudative age-related macular degeneration of left eye with active choroidal neovascularization (HCC)  H35.3221 OCT, Retina - OU - Both Eyes    Intravitreal Injection, Pharmacologic Agent - OS - Left Eye    bevacizumab (AVASTIN) SOSY 2.5 mg    2. Left epiretinal membrane  H35.372       1.  OS, improved markedly on OCT findings currently at 6-week interval.  Recent recurrence of disease February 16, 2021 thus we will repeat injection today to maintain and follow-up next in 6 or 7 weeks  2.  Dilate OU next, likely injection OS next  3.  Ophthalmic Meds Ordered this visit:  Meds ordered this encounter  Medications   bevacizumab (AVASTIN) SOSY 2.5 mg       Return in about 6 weeks (around 08/03/2021) for DILATE OU, AVASTIN OCT, OS.  There are no Patient Instructions on file for this visit.   Explained the diagnoses, plan, and follow up with the patient and they expressed understanding.  Patient expressed understanding of the importance of proper follow up care.   Alford Highland Lorne Winkels M.D. Diseases & Surgery of the Retina and Vitreous [facility] 06/22/21     Abbreviations: M myopia (nearsighted); A astigmatism; H hyperopia (farsighted); P presbyopia; Mrx spectacle prescription;  CTL contact lenses; OD right eye; OS left eye; OU both eyes  XT exotropia; ET esotropia; PEK punctate epithelial keratitis; PEE punctate  epithelial erosions; DES dry eye syndrome; MGD meibomian gland dysfunction; ATs artificial tears; PFAT's preservative free artificial tears; NSC nuclear sclerotic cataract; PSC posterior subcapsular cataract; ERM epi-retinal membrane; PVD posterior vitreous detachment; RD retinal detachment; DM diabetes mellitus; DR diabetic retinopathy; NPDR non-proliferative diabetic retinopathy; PDR proliferative diabetic retinopathy; CSME clinically significant macular edema; DME diabetic macular edema; dbh dot blot hemorrhages; CWS cotton wool spot; POAG primary open angle glaucoma; C/D cup-to-disc ratio; HVF humphrey visual field; GVF goldmann visual field; OCT optical coherence tomography; IOP intraocular pressure; BRVO Branch retinal vein occlusion; CRVO central retinal vein occlusion; CRAO central retinal artery occlusion; BRAO branch retinal artery occlusion; RT retinal tear; SB scleral buckle; PPV pars plana vitrectomy; VH Vitreous hemorrhage; PRP panretinal laser photocoagulation; IVK intravitreal kenalog; VMT vitreomacular traction; MH Macular hole;  NVD neovascularization of the disc; NVE neovascularization elsewhere; AREDS age related eye disease study; ARMD age related macular degeneration; POAG primary open angle glaucoma; EBMD epithelial/anterior basement membrane dystrophy; ACIOL anterior chamber intraocular lens; IOL intraocular lens; PCIOL posterior chamber intraocular lens; Phaco/IOL phacoemulsification with intraocular lens placement; PRK photorefractive keratectomy; LASIK laser assisted in situ keratomileusis; HTN hypertension; DM diabetes mellitus; COPD chronic obstructive pulmonary disease

## 2021-06-22 NOTE — Assessment & Plan Note (Signed)
Minor OS no impact on acuity 

## 2021-06-22 NOTE — Assessment & Plan Note (Signed)
OS, most recent disease activation February 16, 2021, improved macular findings and OCT findings at 6-week interval post Avastin.  Repeat injection today and maintain 6-week interval

## 2021-06-25 ENCOUNTER — Other Ambulatory Visit: Payer: Self-pay

## 2021-06-25 ENCOUNTER — Ambulatory Visit
Admission: RE | Admit: 2021-06-25 | Discharge: 2021-06-25 | Disposition: A | Discharge status: Home / Self Care | Payer: Medicare PPO | Source: Ambulatory Visit | Attending: Internal Medicine | Admitting: Internal Medicine

## 2021-06-25 DIAGNOSIS — J432 Centrilobular emphysema: Secondary | ICD-10-CM | POA: Diagnosis not present

## 2021-06-25 DIAGNOSIS — J8410 Pulmonary fibrosis, unspecified: Secondary | ICD-10-CM

## 2021-06-25 DIAGNOSIS — I2510 Atherosclerotic heart disease of native coronary artery without angina pectoris: Secondary | ICD-10-CM | POA: Diagnosis not present

## 2021-06-25 DIAGNOSIS — J84112 Idiopathic pulmonary fibrosis: Secondary | ICD-10-CM | POA: Diagnosis not present

## 2021-06-25 DIAGNOSIS — J90 Pleural effusion, not elsewhere classified: Secondary | ICD-10-CM | POA: Diagnosis not present

## 2021-06-30 NOTE — Progress Notes (Signed)
Pt aware of results. Appt with Dr Isaiah Serge was scheduled. ILD packet mailed.

## 2021-07-03 DIAGNOSIS — I5043 Acute on chronic combined systolic (congestive) and diastolic (congestive) heart failure: Secondary | ICD-10-CM | POA: Diagnosis not present

## 2021-07-19 ENCOUNTER — Encounter: Payer: Self-pay | Admitting: Pulmonary Disease

## 2021-07-19 ENCOUNTER — Ambulatory Visit: Payer: Medicare PPO | Admitting: Pulmonary Disease

## 2021-07-19 ENCOUNTER — Other Ambulatory Visit: Payer: Self-pay

## 2021-07-19 VITALS — BP 136/78 | HR 75 | Temp 97.7°F | Ht 63.0 in | Wt 178.4 lb

## 2021-07-19 DIAGNOSIS — J8410 Pulmonary fibrosis, unspecified: Secondary | ICD-10-CM

## 2021-07-19 DIAGNOSIS — J449 Chronic obstructive pulmonary disease, unspecified: Secondary | ICD-10-CM

## 2021-07-19 DIAGNOSIS — R0609 Other forms of dyspnea: Secondary | ICD-10-CM

## 2021-07-19 DIAGNOSIS — R5381 Other malaise: Secondary | ICD-10-CM | POA: Diagnosis not present

## 2021-07-19 NOTE — Patient Instructions (Signed)
I have reviewed your scans and lung function test Your shortness of breath is likely coming from a combination of heart issues, emphysema and mild scarring of the lung I would not advise any aggressive work-up at this point Continue supplemental oxygen and will make a referral to exercise therapy at [facility]  Follow-up in 6 months.

## 2021-07-19 NOTE — Progress Notes (Signed)
MAKAYLYN SINYARD    [id number]    01/12/1926  Primary Care Physician:Swayne, Onalee Hua, MD  Referring Physician: Tally Joe, MD [phone] [address] [place],  [place] [address]  Chief complaint: Consult for dyspnea, interstitial lung disease  HPI: 87 year old with history of COPD, hypertension, diastolic heart failure, pulmonary hypertension referred for evaluation of abnormal CT scan  She was hospitalized with COVID-like illness in March 2020.  She was not tested for COVID at that time as it was early in the pandemic.  Also noted to have CHF exacerbation.  Treated with Lasix, steroids and discharged on supplemental oxygen.  CT scan at that time revealed basal fibrotic changes and follow-up in pulmonary was advised.  Follow-up CT scan earlier this year showed continued presence of basal fibrotic changes and probable UIP pattern.  Complains of chronic dyspnea on exertion which worsened from earlier this year after she became sedentary while taking care of her ailing husband. She also follows with Dr. Jacques Navy for acute on chronic diastolic heart failure and severe pulmonary hypertension.  Pets: Dogs, Occupation: Retired Chartered loss adjuster Exposures: No known exposures.  No mold, hot tub, Jacuzzi.  No feather pillows or comforter ILD questionnaire 07/19/2021-no significant exposures Smoking history: 60-pack-year smoker.  Quit in 1968 Travel history: No significant travel history Relevant family history: No family history of lung disease  Outpatient Encounter Medications as of 07/19/2021  Medication Sig   cetirizine (ZYRTEC) 5 MG chewable tablet Chew 5 mg by mouth as needed for allergies.   diltiazem (CARDIZEM CD) 360 MG 24 hr capsule Take 360 mg by mouth daily.   furosemide (LASIX) 40 MG tablet OKAY TO TAKE EVERY OTHER DAY- RESUME TAKING DAILY IF YOU HAVE WORSENING SOB, LEG SWELLING, OR WEIGHT GAIN   metoprolol tartrate (LOPRESSOR) 25 MG tablet Take 12.5 mg by mouth daily.    Multiple Vitamins-Minerals (PRESERVISION AREDS 2) CAPS Take 1 capsule by mouth 2 (two) times daily.    OXYGEN 4 lpm with sleep and exercise [employer]   spironolactone (ALDACTONE) 25 MG tablet Take 0.5 tablets (12.5 mg total) by mouth daily.   No facility-administered encounter medications on file as of 07/19/2021.    Allergies as of 07/19/2021 - Review Complete 07/19/2021  Allergen Reaction Noted   Bisoprolol-hydrochlorothiazide Other (See Comments) 07/30/2018   Meperidine Nausea And Vomiting 03/17/2016   Tetracycline Hives 03/17/2016    Past Medical History:  Diagnosis Date   COPD (chronic obstructive pulmonary disease) (HCC)    Hypertension     No past surgical history on file.  No family history on file.  Social History   Socioeconomic History   Marital status: Married    Spouse name: Not on file   Number of children: Not on file   Years of education: Not on file   Highest education level: Not on file  Occupational History   Not on file  Tobacco Use   Smoking status: Former    Packs/day: 0.75    Years: 17.00    Pack years: 12.75    Types: Cigarettes    Quit date: 08/22/1966    Years since quitting: 54.9   Smokeless tobacco: Former  Building services engineer Use: Never used  Substance and Sexual Activity   Alcohol use: Not Currently   Drug use: Not Currently   Sexual activity: Not on file  Other Topics Concern   Not on file  Social History Narrative   Not on file  Social Determinants of Health   Financial Resource Strain: Not on file  Food Insecurity: Not on file  Transportation Needs: Not on file  Physical Activity: Not on file  Stress: Not on file  Social Connections: Not on file  Intimate Partner Violence: Not on file    Review of systems: Review of Systems  Constitutional: Negative for fever and chills.  HENT: Negative.   Eyes: Negative for blurred vision.  Respiratory: as per HPI  Cardiovascular: Negative for chest pain and palpitations.   Gastrointestinal: Negative for vomiting, diarrhea, blood per rectum. Genitourinary: Negative for dysuria, urgency, frequency and hematuria.  Musculoskeletal: Negative for myalgias, back pain and joint pain.  Skin: Negative for itching and rash.  Neurological: Negative for dizziness, tremors, focal weakness, seizures and loss of consciousness.  Endo/Heme/Allergies: Negative for environmental allergies.  Psychiatric/Behavioral: Negative for depression, suicidal ideas and hallucinations.  All other systems reviewed and are negative.  Physical Exam: Blood pressure 136/78, pulse 75, temperature 97.7 F (36.5 C), temperature source Oral, height 5\' 3"  (1.6 m), weight 178 lb 6.4 oz (80.9 kg), SpO2 92 %. Gen:      No acute distress HEENT:  EOMI, sclera anicteric Neck:     No masses; no thyromegaly Lungs:    Mild bibasal crackles CV:         Regular rate and rhythm; no murmurs Abd:      + bowel sounds; soft, non-tender; no palpable masses, no distension Ext:    No edema; adequate peripheral perfusion Skin:      Warm and dry; no rash Neuro: alert and oriented x 3 Psych: normal mood and affect  Data Reviewed: Imaging: CT high-resolution 10/27/2018-basilar predominant fibrotic ILD and probable UIP pattern High-res CT 06/25/2021-similar pattern of distal predominant fibrotic changes in probable UIP pattern.  Emphysema. I have reviewed the images personally.  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  This has remained stable for the past 2 years. There is no exposure history of signs and symptoms of connective tissue  disease.  Given stability over time and age and frailty I would not recommend aggressive work-up or treatment.  She has not responded to inhalers in the past and does not want to retry Continue supplemental oxygen She would benefit from getting back on an exercise program as she has been sedentary this year. 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

## 2021-08-03 ENCOUNTER — Other Ambulatory Visit: Payer: Self-pay

## 2021-08-03 ENCOUNTER — Ambulatory Visit (INDEPENDENT_AMBULATORY_CARE_PROVIDER_SITE_OTHER): Payer: Medicare PPO | Admitting: Ophthalmology

## 2021-08-03 ENCOUNTER — Encounter (INDEPENDENT_AMBULATORY_CARE_PROVIDER_SITE_OTHER): Payer: Self-pay | Admitting: Ophthalmology

## 2021-08-03 DIAGNOSIS — H35372 Puckering of macula, left eye: Secondary | ICD-10-CM

## 2021-08-03 DIAGNOSIS — H35371 Puckering of macula, right eye: Secondary | ICD-10-CM

## 2021-08-03 DIAGNOSIS — H353112 Nonexudative age-related macular degeneration, right eye, intermediate dry stage: Secondary | ICD-10-CM | POA: Diagnosis not present

## 2021-08-03 DIAGNOSIS — H353132 Nonexudative age-related macular degeneration, bilateral, intermediate dry stage: Secondary | ICD-10-CM

## 2021-08-03 DIAGNOSIS — H353221 Exudative age-related macular degeneration, left eye, with active choroidal neovascularization: Secondary | ICD-10-CM

## 2021-08-03 DIAGNOSIS — H35373 Puckering of macula, bilateral: Secondary | ICD-10-CM | POA: Diagnosis not present

## 2021-08-03 MED ORDER — BEVACIZUMAB 2.5 MG/0.1ML IZ SOSY
2.5000 mg | PREFILLED_SYRINGE | INTRAVITREAL | Status: AC | PRN
Start: 2021-08-03 — End: 2021-08-03
  Administered 2021-08-03: 2.5 mg via INTRAVITREAL

## 2021-08-03 NOTE — Assessment & Plan Note (Signed)
Minor ERM OS no impact on acuity

## 2021-08-03 NOTE — Assessment & Plan Note (Signed)
No sign of CNVM 

## 2021-08-03 NOTE — Assessment & Plan Note (Signed)
No impact on acuity 

## 2021-08-03 NOTE — Progress Notes (Signed)
08/03/2021     CHIEF COMPLAINT Patient presents for  Chief Complaint  Patient presents with   Retina Follow Up      HISTORY OF PRESENT ILLNESS: [name] is a 87 y.o. female who presents to the clinic today for:   HPI     Retina Follow Up           Diagnosis: Wet AMD   Laterality: left eye   Onset: 6 weeks ago   Duration: 6 weeks   Course: stable         Comments   6 week fu OU oct Avastin OS. Patient states vision is stable and unchanged since last visit. Denies any new floaters or FOL.       Last edited by Nelva NayKronstein, Anna N on 08/03/2021 10:17 AM.      Referring physician: Tally JoeSwayne, David, MD [phone] [address] [place],  [place] [id number]  HISTORICAL INFORMATION:   Selected notes from the MEDICAL RECORD NUMBER       CURRENT MEDICATIONS: No current outpatient medications on file. (Ophthalmic Drugs)   No current facility-administered medications for this visit. (Ophthalmic Drugs)   Current Outpatient Medications (Other)  Medication Sig   cetirizine (ZYRTEC) 5 MG chewable tablet Chew 5 mg by mouth as needed for allergies.   diltiazem (CARDIZEM CD) 360 MG 24 hr capsule Take 360 mg by mouth daily.   furosemide (LASIX) 40 MG tablet OKAY TO TAKE EVERY OTHER DAY- RESUME TAKING DAILY IF YOU HAVE WORSENING SOB, LEG SWELLING, OR WEIGHT GAIN   metoprolol tartrate (LOPRESSOR) 25 MG tablet Take 12.5 mg by mouth daily.   Multiple Vitamins-Minerals (PRESERVISION AREDS 2) CAPS Take 1 capsule by mouth 2 (two) times daily.    OXYGEN 4 lpm with sleep and exercise [employer]   spironolactone (ALDACTONE) 25 MG tablet Take 0.5 tablets (12.5 mg total) by mouth daily.   No current facility-administered medications for this visit. (Other)      REVIEW OF SYSTEMS:    ALLERGIES Allergies  Allergen Reactions   Bisoprolol-Hydrochlorothiazide Other (See Comments)    Other reaction(s): Cough (ALLERGY/intolerance)   Meperidine Nausea And Vomiting   Tetracycline  Hives    PAST MEDICAL HISTORY Past Medical History:  Diagnosis Date   COPD (chronic obstructive pulmonary disease) (HCC)    Hypertension    History reviewed. No pertinent surgical history.  FAMILY HISTORY History reviewed. No pertinent family history.  SOCIAL HISTORY Social History   Tobacco Use   Smoking status: Former    Packs/day: 0.75    Years: 17.00    Pack years: 12.75    Types: Cigarettes    Quit date: 08/22/1966    Years since quitting: 54.9   Smokeless tobacco: Former  Building services engineerVaping Use   Vaping Use: Never used  Substance Use Topics   Alcohol use: Not Currently   Drug use: Not Currently         OPHTHALMIC EXAM:  Base Eye Exam     Visual Acuity (ETDRS)       Right Left   Dist [place] 20/30 -2 20/200   Dist ph [place]  20/80 -2         Tonometry (Tonopen, 10:22 AM)       Right Left   Pressure 17 19         Pupils       Pupils Dark Light React APD   Right PERRL 2 2 Minimal None   Left PERRL 2 2 Minimal  None         Visual Fields       Left Right    Full Full         Extraocular Movement       Right Left    Full Full         Neuro/Psych     Oriented x3: Yes   Mood/Affect: Normal         Dilation     Both eyes: 1.0% Mydriacyl, 2.5% Phenylephrine @ 10:22 AM           Slit Lamp and Fundus Exam     External Exam       Right Left   External Normal Normal         Slit Lamp Exam       Right Left   Lids/Lashes Normal Normal   Conjunctiva/Sclera White and quiet White and quiet   Cornea Clear Clear   Anterior Chamber Deep and quiet Deep and quiet   Iris Round and reactive Round and reactive   Lens Posterior chamber intraocular lens Posterior chamber intraocular lens   Anterior Vitreous Normal Central vitreous floaters         Fundus Exam       Right Left   Posterior Vitreous Posterior vitreous detachment Vitreous membranes, strands, Posterior vitreous detachment   Disc Normal Normal,,   C/D Ratio 0.45 0.65    Macula Retinal pigment epithelial detachment, Soft drusen, Pigmented atrophy, Epiretinal membrane, no topographic distortion Age related macular degeneration, Early age related macular degeneration, Macular thickening, Retinal pigment epithelial detachment, Membrane, Epiretinal membrane without topographic distortion, Soft drusen, Geographic atrophy   Vessels Normal Normal   Periphery Normal Normal            IMAGING AND PROCEDURES  Imaging and Procedures for 08/03/21  Intravitreal Injection, Pharmacologic Agent - OS - Left Eye       Time Out 08/03/2021. 10:57 AM. Confirmed correct patient, procedure, site, and patient consented.   Anesthesia Topical anesthesia was used. Anesthetic medications included Lidocaine 4%.   Procedure Preparation included 10% betadine to eyelids, 5% betadine to ocular surface, Ofloxacin . A 30 gauge needle was used.   Injection: 2.5 mg bevacizumab 2.5 MG/0.1ML   Route: Intravitreal, Site: Left Eye   NDC: [phone], Lot: [id number]   Post-op Post injection exam found visual acuity of at least counting fingers. The patient tolerated the procedure well. There were no complications. The patient received written and verbal post procedure care education. Post injection medications included ocuflox.      OCT, Retina - OU - Both Eyes       Right Eye Quality was good. Scan locations included subfoveal. Central Foveal Thickness: 328. Progression has been stable. Findings include pigment epithelial detachment, retinal drusen , abnormal foveal contour, no IRF, no SRF, epiretinal membrane, subretinal hyper-reflective material.   Left Eye Quality was good. Scan locations included subfoveal. Central Foveal Thickness: 304. Progression has improved. Findings include intraretinal fluid, abnormal foveal contour, no SRF, subretinal hyper-reflective material, epiretinal membrane.   Notes Large drusenoid pigment epithelial detachment subfoveal OD, no signs of CNVM,  slightly increased in thickness, as patient continues with ongoing lung disease sequela post COVID and on chronic oxygen use  , OS with epiretinal membrane yet with intraretinal fluid nasal to the fovea and improved CME at current 6 -week interval examination post injection Avastin OS.  Repeat injection OS today and examination again in 8  weeks, as much improved as  compared to April examination               ASSESSMENT/PLAN:  Exudative age-related macular degeneration of left eye with active choroidal neovascularization (HCC) OS currently less active CME, intraretinal fluid at 6-week interval stable on intravitreal Avastin.  Outer retinal scarring limits acuity OS.  We will repeat examination in 8 weeks after injection Avastin today  Intermediate stage nonexudative age-related macular degeneration of both eyes No sign of CNVM  Left epiretinal membrane Minor ERM OS no impact on acuity  Right epiretinal membrane No impact on acuity     ICD-10-CM   1. 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 Intermediate stage nonexudative age-related macular degeneration of both eyes  H35.3132     3. Left epiretinal membrane  H35.372     4. Right epiretinal membrane  H35.371       1.  OU with intermediate AMD with wet AMD OS recent recurrence February 2021, now stabilized at 6-7-week interval follow-up examination, yet with outer retinal scarring limiting acuity  2.  Ongoing systemic hypoxia due to residual lung disease from prior COVID infection.  Patient continues on continuous oxygen and continues to have oxygen desaturations  3.  Ophthalmic Meds Ordered this visit:  Meds ordered this encounter  Medications   bevacizumab (AVASTIN) SOSY 2.5 mg       Return in about 8 weeks  (around 09/28/2021) for dilate, OS, AVASTIN OCT.  There are no Patient Instructions on file for this visit.   Explained the diagnoses, plan, and follow up with the patient and they expressed understanding.  Patient expressed understanding of the importance of proper follow up care.   Alford Highland Aki Abalos M.D. Diseases & Surgery of the Retina and Vitreous [facility] 08/03/21     Abbreviations: M myopia (nearsighted); A astigmatism; H hyperopia (farsighted); P presbyopia; Mrx spectacle prescription;  CTL contact lenses; OD right eye; OS left eye; OU both eyes  XT exotropia; ET esotropia; PEK punctate epithelial keratitis; PEE punctate epithelial erosions; DES dry eye syndrome; MGD meibomian gland dysfunction; ATs artificial tears; PFAT's preservative free artificial tears; NSC nuclear sclerotic cataract; PSC posterior subcapsular cataract; ERM epi-retinal membrane; PVD posterior vitreous detachment; RD retinal detachment; DM diabetes mellitus; DR diabetic retinopathy; NPDR non-proliferative diabetic retinopathy; PDR proliferative diabetic retinopathy; CSME clinically significant macular edema; DME diabetic macular edema; dbh dot blot hemorrhages; CWS cotton wool spot; POAG primary open angle glaucoma; C/D cup-to-disc ratio; HVF humphrey visual field; GVF goldmann visual field; OCT optical coherence tomography; IOP intraocular pressure; BRVO Branch retinal vein occlusion; CRVO central retinal vein occlusion; CRAO central retinal artery occlusion; BRAO branch retinal artery occlusion; RT retinal tear; SB scleral buckle; PPV pars plana vitrectomy; VH Vitreous hemorrhage; PRP panretinal laser photocoagulation; IVK intravitreal kenalog; VMT vitreomacular traction; MH Macular hole;  NVD neovascularization of the disc; NVE neovascularization elsewhere; AREDS age related eye disease study; ARMD age related macular degeneration; POAG primary open angle glaucoma; EBMD epithelial/anterior basement  membrane dystrophy; ACIOL anterior chamber intraocular lens; IOL intraocular lens; PCIOL posterior chamber intraocular lens; Phaco/IOL phacoemulsification with intraocular lens placement; PRK photorefractive keratectomy; LASIK laser assisted in situ keratomileusis; HTN hypertension; DM diabetes mellitus; COPD chronic obstructive pulmonary disease

## 2021-08-03 NOTE — Assessment & Plan Note (Signed)
OS currently less active CME, intraretinal fluid at 6-week interval stable on intravitreal Avastin.  Outer retinal scarring limits acuity OS.  We will repeat examination in 8 weeks after injection Avastin today

## 2021-08-04 ENCOUNTER — Ambulatory Visit: Payer: Medicare PPO | Attending: Pulmonary Disease

## 2021-08-04 DIAGNOSIS — R262 Difficulty in walking, not elsewhere classified: Secondary | ICD-10-CM | POA: Diagnosis not present

## 2021-08-04 DIAGNOSIS — M6281 Muscle weakness (generalized): Secondary | ICD-10-CM | POA: Insufficient documentation

## 2021-08-04 DIAGNOSIS — J449 Chronic obstructive pulmonary disease, unspecified: Secondary | ICD-10-CM | POA: Diagnosis not present

## 2021-08-04 NOTE — Patient Instructions (Signed)
Instructed to do diaphragmatic breathing for every time she exerts until she sees O2 recover to 90

## 2021-08-04 NOTE — Therapy (Signed)
[facility] [place], [place], [address] Phone: [phone]   Fax:  [phone]  Physical Therapy Evaluation  Patient Details  Name: [name] MRN: [id number] Date of Birth: 09/18/1925 Referring Provider (PT): Candace Cruise Mannam   Encounter Date: 08/04/2021   PT End of Session - 08/04/21 1147     Visit Number 1    Date for PT Re-Evaluation 09/29/21    Authorization Type [employer]    PT Start Time 1017    PT Stop Time 1102    PT Time Calculation (min) 45 min    Activity Tolerance Other (comment)   limited by O2 sat reading (72 with Tug test)   Behavior During Therapy [facility] for tasks assessed/performed             Past Medical History:  Diagnosis Date   COPD (chronic obstructive pulmonary disease) ([place])    Hypertension     History reviewed. No pertinent surgical history.  There were no vitals filed for this visit.    Subjective Assessment - 08/04/21 1023     Subjective Patient arrives in wheelchair with her dtr [name].  She is on continuous O2 at 4 liters.  She is referred by Dr. Vaughan Browner for deconditioning due to her respiratory issues.  Patient explains she had a hospitalization in March of 2020 just before Covid and was never diagnosed.  MD feels this along with her history of smoking (cessation 1968) contributes to her respiratory condition.  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  This along with her hospitalization for probable Covid has left her O2 dependent and unable to function as she would desire. She hopes to be able to walk throughout her home and do some cooking.  She would love to decrease her O2 dependence but she understands she has a lot of lung damage.  She has 3 daughters who rotate staying with her so she does have 24 hour care.    Patient is accompained by: Family member    Pertinent History COPD, Pulmonary Htn,  Diastolic heart failure    Limitations Standing;Walking    How long can you sit comfortably? unlimited    How long can you stand comfortably? 15 min    How long can you walk comfortably? about 30-40 ft    Patient Stated Goals To be able to do more at home like walking and cooking without being exhausted.    Currently in Pain? No/denies                [facility] PT Assessment - 08/04/21 0001       Assessment   Medical Diagnosis Physical deconditioning    Referring Provider (PT) Tennis Must. Praveen Mannam    Onset Date/Surgical Date 11/21/19    Hand Dominance Right    Next MD Visit 6 months    Prior Therapy not for respiratory      Precautions   Precautions Fall      Restrictions   Weight Bearing Restrictions No      Balance Screen   Has the patient fallen in the past 6 months No    Has the patient had a decrease in activity level because of a fear of falling?  No    Is the patient reluctant to leave their home because of a fear of falling?  No      Home  [place] residence    Living Arrangements Children    Available Help at Discharge Family    Type of [place];Tub bench;Grab bars - toilet;Hand held shower head;Transport chair      Prior Function   Level of Independence Independent with basic ADLs;Independent with household mobility without device;Independent with transfers    Vocation Retired    Education officer, environmental   Overall Cognitive Status Within Functional Limits for tasks assessed      Observation/Other Assessments   Observations slouched in wheelchair with portable O2 via nasal canula      Functional Tests   Functional tests Sit to Stand      Sit to Stand   Comments independent with use of hands from wheelchair      Posture/Postural Control   Posture/Postural Control Postural limitations    Postural Limitations Rounded  Shoulders;Forward head;Increased thoracic kyphosis;Posterior pelvic tilt      ROM / Strength   AROM / PROM / Strength AROM;Strength      AROM   Overall AROM  Within functional limits for tasks performed    Overall AROM Comments WFL bilateral UE and LE      Strength   Overall Strength Within functional limits for tasks performed    Overall Strength Comments Generally 4- to 4/5 bilateral UE and LE      Transfers   Transfers Sit to Stand    Sit to Stand 7: Independent      Ambulation/Gait   Ambulation/Gait Yes    Assistive device None    Gait Pattern Step-through pattern;Decreased arm swing - right;Decreased arm swing - left;Decreased stride length;Right flexed knee in stance;Left flexed knee in stance;Trunk flexed;Poor foot clearance - left;Poor foot clearance - right    Ambulation Surface Level    Gait Comments Only able to do TUG as patients O2 sats dropped to 72      Standardized Balance Assessment   Standardized Balance Assessment Timed Up and Go Test      Timed Up and Go Test   TUG Normal TUG    Normal TUG (seconds) 16                        Objective measurements completed on examination: See above findings.                PT Education - 08/04/21 1143     Education Details Educated patient on monitoring O2 sats with pulse ox and how cold hands and poor circulation will affect these numbers.  Suggested warming hands by doing fist pumps or rubbing hands together along with placing her hand down in dependent position to allow for blood flow.    Person(s) Educated Patient;Child(ren)    Methods Explanation;Demonstration;Verbal cues    Comprehension Verbalized understanding;Returned demonstration              PT Short Term Goals - 08/04/21 1215       PT SHORT TERM GOAL #1   Title Patient to be independent with initial HEP    Time 4    Period Weeks    Status New    Target Date 09/01/21      PT SHORT TERM GOAL #2   Title Patient to be  able to walk 30 feet and maintain O2  sats at 85% or above with 4 liters O2    Time 4    Period Weeks    Status New    Target Date 09/01/21               PT Long Term Goals - 08/04/21 1219       PT LONG TERM GOAL #1   Title Independence with advanced HEP    Time 8    Period Weeks    Status New    Target Date 09/29/21      PT LONG TERM GOAL #2   Title Patient to be able to stand erect to cook vs. leaning on counter x 30 min    Time 8    Period Weeks    Status New    Target Date 09/29/21      PT LONG TERM GOAL #3   Title Patient to be able to ambulate 100 feet with continuous O2 maintaining O2 sats at 85% or above.    Time 8    Period Weeks    Status New    Target Date 09/29/21      PT LONG TERM GOAL #4   Title Patient to be able to decreased O2 level to 3 liters when at rest.    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  She was a Firefighter as well.  She had a respiratory emergency back in March of 2020 where she was hospitalized for 5 days.  This was just before Covid was discovered and her doctors feel she had Covid due to her symptoms and how her respiratory capacity greatly diminished and she became O2 dependent.  She is on 4 liters of continuous O2.  She presents with functional ROM bilateral UE and LE's.  Her overall strength is generally 4- to 4/5 bilateral UE and LE.  She ambulates without assistive device for TUG and scores 16 sec.  She demonstrates fairly steady gait.  Her use of the wheelchair  is strictly due to her respiratory deficits.  She would like to be able to do more walking at home and do her own cooking without becoming so exhausted.  She would benefit from skilled PT for conditioning and strength along with postural strengthening and balance training.    Personal Factors and Comorbidities Age;Comorbidity 1;Comorbidity 2;Comorbidity 3+    Comorbidities COPD, Htn, Diastolic Heart failure, pulmonary htn    Examination-Activity Limitations Bathing;Dressing;Bend;Lift;Squat;Stairs;Stand;Carry    Examination-Participation Restrictions Laundry;Cleaning;Shop;Community Activity;Meal Prep    Stability/Clinical Decision Making Evolving/Moderate complexity    Clinical Decision Making Moderate    Rehab Potential Fair    PT Frequency 2x / week    PT Duration 8 weeks    PT Treatment/Interventions ADLs/Self Care Home Management;Aquatic Therapy;Canalith Repostioning;Traction;Moist Heat;Iontophoresis 4mg /ml Dexamethasone;Electrical Stimulation;Cryotherapy;Ultrasound;DME Instruction;Gait training;Therapeutic exercise;Therapeutic  activities;Functional mobility training;Stair training;Balance training;Neuromuscular re-education;Patient/family education;Manual techniques;Wheelchair mobility training;Passive range of motion;Vestibular;Vasopneumatic Device;Taping;Energy conservation;Dry needling    PT Next Visit Plan Keep hands warm using heat to monitor O2 sats,  begin NuStep and aerobic exercise monitoring O2 sats throughout,  seated postural exercises with tband    PT Home Exercise Plan practice diaphragmatic breathing    Consulted and Agree with Plan of Care Patient;Family member/caregiver    Family Member Consulted Daugther: [name]             Patient will benefit from skilled therapeutic intervention in order to improve the following deficits and impairments:  Abnormal gait, Decreased balance, Decreased endurance, Decreased mobility, Difficulty walking, Cardiopulmonary status limiting  activity, Increased edema, Postural dysfunction, Impaired flexibility, Decreased strength, Decreased safety awareness, Decreased activity tolerance  Visit Diagnosis: Muscle weakness (generalized) - Plan: PT plan of care cert/re-cert  Difficulty in walking, not elsewhere classified - Plan: PT plan of care cert/re-cert  Chronic obstructive pulmonary disease, unspecified COPD type (HCC) - Plan: PT plan of care cert/re-cert     Problem List Patient Active Problem List   Diagnosis Date Noted   Right epiretinal membrane 11/24/2020   Exudative age-related macular degeneration of left eye with active choroidal neovascularization (HCC) 11/26/2019   Left epiretinal membrane 11/26/2019   Intermediate stage nonexudative age-related macular degeneration of both eyes 11/26/2019   Pseudophakia 11/26/2019   Posterior vitreous detachment of both eyes 11/26/2019   COPD  GOLD I 01/09/2019   Cor pulmonale (chronic) (HCC) 01/09/2019   Chronic respiratory failure with hypoxia and hypercapnia (HCC) 01/09/2019   Postinflammatory pulmonary fibrosis (HCC) 01/09/2019   DOE (dyspnea on exertion) 01/08/2019   COPD exacerbation (HCC) 10/27/2018   Essential hypertension 10/27/2018   Acute respiratory failure (HCC) 10/27/2018   Hypomagnesemia 10/27/2018   Hypokalemia 10/27/2018    Biana Haggar B. Michille Mcelrath, PT 08/05/2211:26 PM   [facility] @ [facility] [address] [place], [place], [address] Phone: [phone]   Fax:  [phone]  Name: [name] MRN: [id number] Date of Birth: 02-15-1926

## 2021-08-06 ENCOUNTER — Other Ambulatory Visit: Payer: Self-pay

## 2021-08-06 ENCOUNTER — Ambulatory Visit: Payer: Medicare PPO

## 2021-08-06 DIAGNOSIS — J449 Chronic obstructive pulmonary disease, unspecified: Secondary | ICD-10-CM | POA: Diagnosis not present

## 2021-08-06 DIAGNOSIS — R262 Difficulty in walking, not elsewhere classified: Secondary | ICD-10-CM

## 2021-08-06 DIAGNOSIS — M6281 Muscle weakness (generalized): Secondary | ICD-10-CM | POA: Diagnosis not present

## 2021-08-06 NOTE — Therapy (Signed)
[facility] @ [place], [place], [address] Phone: [phone]   Fax:  [phone]  Physical Therapy Treatment  Patient Details  Name: [name] MRN: [id number] Date of Birth: [birth year] Referring Provider (PT): Candace Cruise Mannam   Encounter Date: 08/06/2021   PT End of Session - 08/06/21 1120     Visit Number 2    Date for PT Re-Evaluation 09/29/21    Authorization Type [employer]    PT Start Time 0845    PT Stop Time 0930    PT Time Calculation (min) 45 min    Activity Tolerance Other (comment)    Behavior During Therapy WFL for tasks assessed/performed             Past Medical History:  Diagnosis Date   COPD (chronic obstructive pulmonary disease) ([place])    Hypertension     History reviewed. No pertinent surgical history.  There were no vitals filed for this visit.   Subjective Assessment - 08/06/21 0903     Subjective Patient states she is having trouble breathing today.  She feels its the moist air outside.    Patient is accompained by: Family member    Pertinent History COPD, Pulmonary Htn, Diastolic heart failure    Limitations Standing;Walking    How long can you sit comfortably? unlimited    How long can you stand comfortably? 15 min    How long can you walk comfortably? about 30-40 ft    Patient Stated Goals To be able to do more at home like walking and cooking without being exhausted.                               [facility] Adult PT Treatment/Exercise - 08/06/21 0001       Exercises   Exercises Knee/Hip;Shoulder      Knee/Hip Exercises: Aerobic   Nustep Lev 1 2 x 3 min monitoring O2 and perceived exertion      Knee/Hip Exercises: Seated   Long Arc Quad Strengthening;Both;1 set;10 reps   O2: 90%   Clamshell with TheraBand Green   Green loop x 20: O2 92-93%   Other Seated Knee/Hip Exercises seated toe and heel raises   O2: 90-92   Marching  Strengthening;Both;1 set;20 reps    Marching Limitations alternating 10 each LE   O2: 88-91     Shoulder Exercises: Seated   Elevation Strengthening;Both;10 reps   O2 90-95%   Horizontal ABduction Strengthening;Both;10 reps    Theraband Level (Shoulder Horizontal ABduction) Level 1 (Yellow)   O2: 93%                    PT Education - 08/06/21 1118     Education Details Educated patient on need for deep diaphragmatic breathing to reach rich oxygenated blood to circulate through her body and allow for improved function and performance.    Person(s) Educated Patient    Methods Explanation    Comprehension Verbalized understanding              PT Short Term Goals - 08/04/21 1215       PT SHORT TERM GOAL #1   Title Patient to be independent with initial HEP    Time 4    Period Weeks    Status New    Target Date 09/01/21      PT SHORT TERM GOAL #2  Title Patient to be able to walk 30 feet and maintain O2 sats at 85% or above with 4 liters O2    Time 4    Period Weeks    Status New    Target Date 09/01/21               PT Long Term Goals - 08/04/21 1219       PT LONG TERM GOAL #1   Title Independence with advanced HEP    Time 8    Period Weeks    Status New    Target Date 09/29/21      PT LONG TERM GOAL #2   Title Patient to be able to stand erect to cook vs. leaning on counter x 30 min    Time 8    Period Weeks    Status New    Target Date 09/29/21      PT LONG TERM GOAL #3   Title Patient to be able to ambulate 100 feet with continuous O2 maintaining O2 sats at 85% or above.    Time 8    Period Weeks    Status New    Target Date 09/29/21      PT LONG TERM GOAL #4   Title Patient to be able to decreased O2 level to 3 liters when at rest.    Time 8    Period Weeks    Status New    Target Date 09/29/21      PT LONG TERM GOAL #5   Title Patient to be able to demonstrate proper diaphragmatic breathing independently    Time 8    Period  Weeks    Status New    Target Date 09/29/21                   Plan - 08/06/21 1121     Clinical Impression Statement Kaelah arrived with dtr and 2 O2 options in wheelchair.  She had mentioned that she was having trouble breathing today.  We monitored her O2 throughout her session today while using moist heat pad to keep her hands warm to achieve accurate readings.  She was able to do all tasks today with de-sat to no lower than 85% and was able to recover with proper breathing withing 30 seconds.  She did need heavy review on proper diaphragmatic breathing but was demonstrating properly by last exercise.  She would benefit from skilled PT for LE strengthening and balance training along with pulmonary rehab.    Personal Factors and Comorbidities Age;Comorbidity 1;Comorbidity 2;Comorbidity 3+    Comorbidities COPD, Htn, Diastolic Heart failure, pulmonary htn    Examination-Activity Limitations Bathing;Dressing;Bend;Lift;Squat;Stairs;Stand;Carry    Examination-Participation Restrictions Laundry;Cleaning;Shop;Community Activity;Meal Prep    Stability/Clinical Decision Making Evolving/Moderate complexity    Clinical Decision Making Moderate    Rehab Potential Fair    PT Frequency 2x / week    PT Duration 8 weeks    PT Treatment/Interventions ADLs/Self Care Home Management;Aquatic Therapy;Canalith Repostioning;Traction;Moist Heat;Iontophoresis 4mg /ml Dexamethasone;Electrical Stimulation;Cryotherapy;Ultrasound;DME Instruction;Gait training;Therapeutic exercise;Therapeutic activities;Functional mobility training;Stair training;Balance training;Neuromuscular re-education;Patient/family education;Manual techniques;Wheelchair mobility training;Passive range of motion;Vestibular;Vasopneumatic Device;Taping;Energy conservation;Dry needling    PT Next Visit Plan Continue to monitor O2 sats with various activity and as we progress exercises.  Keep hands warm using moist heat pack throughout session.    PT  Home Exercise Plan continue to practice diaphragmatic breathing    Consulted and Agree with Plan of Care Patient;Family member/caregiver    Family Member Consulted Daugther: Izora Gala  Patient will benefit from skilled therapeutic intervention in order to improve the following deficits and impairments:  Abnormal gait, Decreased balance, Decreased endurance, Decreased mobility, Difficulty walking, Cardiopulmonary status limiting activity, Increased edema, Postural dysfunction, Impaired flexibility, Decreased strength, Decreased safety awareness, Decreased activity tolerance  Visit Diagnosis: Muscle weakness (generalized)  Difficulty in walking, not elsewhere classified  Chronic obstructive pulmonary disease, unspecified COPD type [facility])     Problem List Patient Active Problem List   Diagnosis Date Noted   Right epiretinal membrane 11/24/2020   Exudative age-related macular degeneration of left eye with active choroidal neovascularization (HCC) 11/26/2019   Left epiretinal membrane 11/26/2019   Intermediate stage nonexudative age-related macular degeneration of both eyes 11/26/2019   Pseudophakia 11/26/2019   Posterior vitreous detachment of both eyes 11/26/2019   COPD  GOLD I 01/09/2019   Cor pulmonale (chronic) (HCC) 01/09/2019   Chronic respiratory failure with hypoxia and hypercapnia (HCC) 01/09/2019   Postinflammatory pulmonary fibrosis (HCC) 01/09/2019   DOE (dyspnea on exertion) 01/08/2019   COPD exacerbation (HCC) 10/27/2018   Essential hypertension 10/27/2018   Acute respiratory failure (HCC) 10/27/2018   Hypomagnesemia 10/27/2018   Hypokalemia 10/27/2018    Mannie Ohlin B. Curlie Sittner, PT 08/06/2210:28 AM   [facility] @ [facility] [address] [place], [place], [address] Phone: [phone]   Fax:  [phone]  Name: JANERA PEUGH MRN: [id number] Date of Birth: 07/11/1926

## 2021-08-06 NOTE — Patient Instructions (Signed)
Instructed to warm hands prior to using her pulse ox.

## 2021-08-10 DIAGNOSIS — H52203 Unspecified astigmatism, bilateral: Secondary | ICD-10-CM | POA: Diagnosis not present

## 2021-08-10 DIAGNOSIS — H524 Presbyopia: Secondary | ICD-10-CM | POA: Diagnosis not present

## 2021-08-10 DIAGNOSIS — H353130 Nonexudative age-related macular degeneration, bilateral, stage unspecified: Secondary | ICD-10-CM | POA: Diagnosis not present

## 2021-08-10 DIAGNOSIS — Z961 Presence of intraocular lens: Secondary | ICD-10-CM | POA: Diagnosis not present

## 2021-08-10 DIAGNOSIS — E119 Type 2 diabetes mellitus without complications: Secondary | ICD-10-CM | POA: Diagnosis not present

## 2021-08-18 ENCOUNTER — Other Ambulatory Visit: Payer: Self-pay

## 2021-08-18 ENCOUNTER — Ambulatory Visit: Payer: Medicare PPO

## 2021-08-18 DIAGNOSIS — M6281 Muscle weakness (generalized): Secondary | ICD-10-CM

## 2021-08-18 DIAGNOSIS — J449 Chronic obstructive pulmonary disease, unspecified: Secondary | ICD-10-CM

## 2021-08-18 DIAGNOSIS — R262 Difficulty in walking, not elsewhere classified: Secondary | ICD-10-CM | POA: Diagnosis not present

## 2021-08-18 NOTE — Therapy (Signed)
[facility] @ [place], [place], [address] Phone: [phone]   Fax:  [phone]  Physical Therapy Treatment  Patient Details  Name: [name] MRN: [id number] Date of Birth: [birth year] Referring Provider (PT): Candace Cruise Mannam   Encounter Date: 08/18/2021   PT End of Session - 08/18/21 1032     Visit Number 3    Date for PT Re-Evaluation 09/29/21    Authorization Type [employer]    PT Start Time [id number]    PT Stop Time 1050    PT Time Calculation (min) 35 min    Activity Tolerance Other (comment)   O2 levels struggle to stay at or above 90%   Behavior During Therapy [facility] for tasks assessed/performed             Past Medical History:  Diagnosis Date   COPD (chronic obstructive pulmonary disease) ([place])    Hypertension     History reviewed. No pertinent surgical history.  There were no vitals filed for this visit.   Subjective Assessment - 08/18/21 1027     Subjective Patient states she hasn't pain much attention to her O2 over Christmas.  She has had a lot of company and has been fairly busy.  She states she is feeling "pretty normal" this morning.    Patient is accompained by: Family member    Pertinent History COPD, Pulmonary Htn, Diastolic heart failure    Limitations Standing;Walking    How long can you sit comfortably? unlimited    How long can you stand comfortably? 15 min    How long can you walk comfortably? about 30-40 ft    Patient Stated Goals To be able to do more at home like walking and cooking without being exhausted.                               [facility] Adult PT Treatment/Exercise - 08/18/21 0001       Knee/Hip Exercises: Aerobic   Nustep Lev 1 2 x 3 min monitoring O2 and perceived exertion      Knee/Hip Exercises: Seated   Long Arc Quad Strengthening;Both;1 set;10 reps   O2: 90%   Other Seated Knee/Hip Exercises seated toe and heel raises   O2:  90-92   Marching Strengthening;Both;1 set;20 reps    Marching Limitations alternating 10 each LE   O2: 88-91                      PT Short Term Goals - 08/04/21 1215       PT SHORT TERM GOAL #1   Title Patient to be independent with initial HEP    Time 4    Period Weeks    Status New    Target Date 09/01/21      PT SHORT TERM GOAL #2   Title Patient to be able to walk 30 feet and maintain O2 sats at 85% or above with 4 liters O2    Time 4    Period Weeks    Status New    Target Date 09/01/21               PT Long Term Goals - 08/04/21 1219       PT LONG TERM GOAL #1   Title Independence with advanced HEP    Time 8    Period Weeks    Status  New    Target Date 09/29/21      PT LONG TERM GOAL #2   Title Patient to be able to stand erect to cook vs. leaning on counter x 30 min    Time 8    Period Weeks    Status New    Target Date 09/29/21      PT LONG TERM GOAL #3   Title Patient to be able to ambulate 100 feet with continuous O2 maintaining O2 sats at 85% or above.    Time 8    Period Weeks    Status New    Target Date 09/29/21      PT LONG TERM GOAL #4   Title Patient to be able to decreased O2 level to 3 liters when at rest.    Time 8    Period Weeks    Status New    Target Date 09/29/21      PT LONG TERM GOAL #5   Title Patient to be able to demonstrate proper diaphragmatic breathing independently    Time 8    Period Weeks    Status New    Target Date 09/29/21                   Plan - 08/18/21 1034     Clinical Impression Statement [name] struggled to reach 90% O2 sats today but completed all tasks with min complaint of breathlessness.  We held at further exercise today as she was not recovering easily.  She was reaching mid to low 80's at times  with at least a 2 min recovery to get to 90% and could not get to 90% at times.    Personal Factors and Comorbidities Age;Comorbidity 1;Comorbidity 2;Comorbidity 3+     Comorbidities COPD, Htn, Diastolic Heart failure, pulmonary htn    Examination-Activity Limitations Bathing;Dressing;Bend;Lift;Squat;Stairs;Stand;Carry    Examination-Participation Restrictions Laundry;Cleaning;Shop;Community Activity;Meal Prep    Stability/Clinical Decision Making Evolving/Moderate complexity    Clinical Decision Making Moderate    Rehab Potential Fair    PT Frequency 2x / week    PT Duration 8 weeks    PT Treatment/Interventions ADLs/Self Care Home Management;Aquatic Therapy;Canalith Repostioning;Traction;Moist Heat;Iontophoresis 4mg /ml Dexamethasone;Electrical Stimulation;Cryotherapy;Ultrasound;DME Instruction;Gait training;Therapeutic exercise;Therapeutic activities;Functional mobility training;Stair training;Balance training;Neuromuscular re-education;Patient/family education;Manual techniques;Wheelchair mobility training;Passive range of motion;Vestibular;Vasopneumatic Device;Taping;Energy conservation;Dry needling    PT Next Visit Plan Continue to monitor O2 sats with various activity and as we progress exercises.  Keep hands warm using moist heat pack throughout session.    PT Home Exercise Plan continue to practice diaphragmatic breathing    Consulted and Agree with Plan of Care Patient;Family member/caregiver    Family Member Consulted Daugther:             Patient will benefit from skilled therapeutic intervention in order to improve the following deficits and impairments:  Abnormal gait, Decreased balance, Decreased endurance, Decreased mobility, Difficulty walking, Cardiopulmonary status limiting activity, Increased edema, Postural dysfunction, Impaired flexibility, Decreased strength, Decreased safety awareness, Decreased activity tolerance  Visit Diagnosis: Muscle weakness (generalized)  Difficulty in walking, not elsewhere classified  Chronic obstructive pulmonary disease, unspecified COPD type [facility])     Problem List Patient Active Problem List    Diagnosis Date Noted   Right epiretinal membrane 11/24/2020   Exudative age-related macular degeneration of left eye with active choroidal neovascularization (HCC) 11/26/2019   Left epiretinal membrane 11/26/2019   Intermediate stage nonexudative age-related macular degeneration of both eyes 11/26/2019   Pseudophakia 11/26/2019   Posterior  vitreous detachment of both eyes 11/26/2019   COPD  GOLD I 01/09/2019   Cor pulmonale (chronic) ([place]) 01/09/2019   Chronic respiratory failure with hypoxia and hypercapnia ([place]) 01/09/2019   Postinflammatory pulmonary fibrosis ([place]) 01/09/2019   DOE (dyspnea on exertion) 01/08/2019   COPD exacerbation ([place]) 10/27/2018   Essential hypertension 10/27/2018   Acute respiratory failure ([place]) 10/27/2018   Hypomagnesemia 10/27/2018   Hypokalemia 10/27/2018    [name] B. Leanore Biggers, PT 08/18/2209:55 AM   [facility] @ [place], [place], [address] Phone: [phone]   Fax:  [phone]  Name: [name] MRN: [id number] Date of Birth: [birth year]

## 2021-08-20 ENCOUNTER — Other Ambulatory Visit: Payer: Self-pay

## 2021-08-20 ENCOUNTER — Ambulatory Visit: Payer: Medicare PPO

## 2021-08-20 DIAGNOSIS — R262 Difficulty in walking, not elsewhere classified: Secondary | ICD-10-CM

## 2021-08-20 DIAGNOSIS — M6281 Muscle weakness (generalized): Secondary | ICD-10-CM | POA: Diagnosis not present

## 2021-08-20 DIAGNOSIS — J449 Chronic obstructive pulmonary disease, unspecified: Secondary | ICD-10-CM | POA: Diagnosis not present

## 2021-08-20 NOTE — Therapy (Signed)
[facility] @ [place], [place], [address] Phone: [phone]   Fax:  [phone]  Physical Therapy Treatment  Patient Details  Name: [name] MRN: [id number] Date of Birth: 1926-04-12 Referring Provider (PT): Candace Cruise Mannam   Encounter Date: 08/20/2021   PT End of Session - 08/20/21 0847     Visit Number 4    Date for PT Re-Evaluation 09/29/21    Authorization Type [employer]    PT Start Time 0800    PT Stop Time 0845    PT Time Calculation (min) 45 min    Activity Tolerance Other (comment)    Behavior During Therapy WFL for tasks assessed/performed             Past Medical History:  Diagnosis Date   COPD (chronic obstructive pulmonary disease) ([place])    Hypertension     History reviewed. No pertinent surgical history.  There were no vitals filed for this visit.   Subjective Assessment - 08/20/21 0808     Subjective Patient states she is feeling good today.  No issues or complications after last visit.    Patient is accompained by: Family member    Pertinent History COPD, Pulmonary Htn, Diastolic heart failure    Limitations Standing;Walking    How long can you sit comfortably? unlimited    How long can you stand comfortably? 15 min    How long can you walk comfortably? about 30-40 ft    Patient Stated Goals To be able to do more at home like walking and cooking without being exhausted.    Currently in Pain? No/denies                               [facility] Adult PT Treatment/Exercise - 08/20/21 0001       Ambulation/Gait   Ambulation/Gait Yes    Ambulation/Gait Assistance 5: Supervision    Ambulation/Gait Assistance Details stand by with PT carrying O2 tank    Ambulation Distance (Feet) 47 Feet    Assistive device None      Knee/Hip Exercises: Aerobic   Nustep Lev 1 2 x 3 min monitoring O2 and perceived exertion      Knee/Hip Exercises: Standing   Heel  Raises Both;10 reps    Hip Abduction Stengthening;Both;1 set;10 reps    Hip Extension Stengthening;Both;1 set;10 reps    Functional Squat 20 reps      Knee/Hip Exercises: Seated   Sit to Sand 10 reps;with UE support                       PT Short Term Goals - 08/04/21 1215       PT SHORT TERM GOAL #1   Title Patient to be independent with initial HEP    Time 4    Period Weeks    Status New    Target Date 09/01/21      PT SHORT TERM GOAL #2   Title Patient to be able to walk 30 feet and maintain O2 sats at 85% or above with 4 liters O2    Time 4    Period Weeks    Status New    Target Date 09/01/21               PT Long Term Goals - 08/04/21 1219       PT LONG TERM GOAL #  1   Title Independence with advanced HEP    Time 8    Period Weeks    Status New    Target Date 09/29/21      PT LONG TERM GOAL #2   Title Patient to be able to stand erect to cook vs. leaning on counter x 30 min    Time 8    Period Weeks    Status New    Target Date 09/29/21      PT LONG TERM GOAL #3   Title Patient to be able to ambulate 100 feet with continuous O2 maintaining O2 sats at 85% or above.    Time 8    Period Weeks    Status New    Target Date 09/29/21      PT LONG TERM GOAL #4   Title Patient to be able to decreased O2 level to 3 liters when at rest.    Time 8    Period Weeks    Status New    Target Date 09/29/21      PT LONG TERM GOAL #5   Title Patient to be able to demonstrate proper diaphragmatic breathing independently    Time 8    Period Weeks    Status New    Target Date 09/29/21                   Plan - 08/20/21 6222     Clinical Impression Statement Patient started off with 91% O2 prior to beginning NuStep.  After first 3 min of pedaling, she dropped to 85% but recovered to 90% within 1 1/2 min.  She seemed to have slowed her breathing and had better technique with her diagphragmatic breathing today.  We emphasized posture during  breathing to allow the lungs to expand fully.  [name] averaged recovery time of 2-2.5 min.  She was able to walk 47 feet before needing to rest today.    Personal Factors and Comorbidities Age;Comorbidity 1;Comorbidity 2;Comorbidity 3+    Comorbidities COPD, Htn, Diastolic Heart failure, pulmonary htn    Examination-Activity Limitations Bathing;Dressing;Bend;Lift;Squat;Stairs;Stand;Carry    Examination-Participation Restrictions Laundry;Cleaning;Shop;Community Activity;Meal Prep    Stability/Clinical Decision Making Evolving/Moderate complexity    Clinical Decision Making Moderate    Rehab Potential Fair    PT Frequency 2x / week    PT Duration 8 weeks    PT Treatment/Interventions ADLs/Self Care Home Management;Aquatic Therapy;Canalith Repostioning;Traction;Moist Heat;Iontophoresis 4mg /ml Dexamethasone;Electrical Stimulation;Cryotherapy;Ultrasound;DME Instruction;Gait training;Therapeutic exercise;Therapeutic activities;Functional mobility training;Stair training;Balance training;Neuromuscular re-education;Patient/family education;Manual techniques;Wheelchair mobility training;Passive range of motion;Vestibular;Vasopneumatic Device;Taping;Energy conservation;Dry needling    PT Next Visit Plan Continue to monitor O2 sats with various activity and as we progress exercises.  Keep hands warm using moist heat pack or patients hand warming device throughout session to obtain accurate readings.  .    PT Home Exercise Plan continue to practice diaphragmatic breathing and monitor O2 sats with bicycling    Consulted and Agree with Plan of Care Patient;Family member/caregiver    Family Member Consulted Daugther:             Patient will benefit from skilled therapeutic intervention in order to improve the following deficits and impairments:  Abnormal gait, Decreased balance, Decreased endurance, Decreased mobility, Difficulty walking, Cardiopulmonary status limiting activity, Increased edema, Postural  dysfunction, Impaired flexibility, Decreased strength, Decreased safety awareness, Decreased activity tolerance  Visit Diagnosis: Muscle weakness (generalized)  Difficulty in walking, not elsewhere classified  Chronic obstructive pulmonary disease, unspecified COPD type (HCC)  Problem List Patient Active Problem List   Diagnosis Date Noted   Right epiretinal membrane 11/24/2020   Exudative age-related macular degeneration of left eye with active choroidal neovascularization ([place]) 11/26/2019   Left epiretinal membrane 11/26/2019   Intermediate stage nonexudative age-related macular degeneration of both eyes 11/26/2019   Pseudophakia 11/26/2019   Posterior vitreous detachment of both eyes 11/26/2019   COPD  GOLD I 01/09/2019   Cor pulmonale (chronic) ([place]) 01/09/2019   Chronic respiratory failure with hypoxia and hypercapnia ([place]) 01/09/2019   Postinflammatory pulmonary fibrosis ([place]) 01/09/2019   DOE (dyspnea on exertion) 01/08/2019   COPD exacerbation ([place]) 10/27/2018   Essential hypertension 10/27/2018   Acute respiratory failure ([place]) 10/27/2018   Hypomagnesemia 10/27/2018   Hypokalemia 10/27/2018    [name], PT 12/30/228:59 AM   [facility] @ [place], [place], [address] Phone: [phone]   Fax:  [phone]  Name: RITTIE MARUT MRN: [id number] Date of Birth: 1926/04/25

## 2021-08-24 ENCOUNTER — Ambulatory Visit: Payer: Medicare PPO | Attending: Pulmonary Disease

## 2021-08-24 ENCOUNTER — Other Ambulatory Visit: Payer: Self-pay

## 2021-08-24 DIAGNOSIS — J449 Chronic obstructive pulmonary disease, unspecified: Secondary | ICD-10-CM | POA: Diagnosis not present

## 2021-08-24 DIAGNOSIS — R262 Difficulty in walking, not elsewhere classified: Secondary | ICD-10-CM | POA: Insufficient documentation

## 2021-08-24 DIAGNOSIS — M6281 Muscle weakness (generalized): Secondary | ICD-10-CM | POA: Insufficient documentation

## 2021-08-24 NOTE — Patient Instructions (Signed)
Instructed patient to be more aware of posture during recovery.  Instructed in axial extension and insuring she is using the diaphragm.

## 2021-08-24 NOTE — Therapy (Signed)
[facility] @ [place], [place], [address] Phone: [phone]   Fax:  [phone]  Physical Therapy Treatment  Patient Details  Name: [name] MRN: [id number] Date of Birth: 14-Mar-1926 Referring Provider (PT): Candace Cruise Mannam   Encounter Date: 08/24/2021   PT End of Session - 08/24/21 1030     Visit Number 5    Date for PT Re-Evaluation 09/29/21    Authorization Type [employer]    PT Start Time 1015    PT Stop Time 1057    PT Time Calculation (min) 42 min    Activity Tolerance Other (comment)   Recovery to 90% O2 averaged 2.5 to 3 min   Behavior During Therapy WFL for tasks assessed/performed             Past Medical History:  Diagnosis Date   COPD (chronic obstructive pulmonary disease) ([place])    Hypertension     History reviewed. No pertinent surgical history.  There were no vitals filed for this visit.   Subjective Assessment - 08/24/21 1020     Subjective Patient states she has experienced some trouble breathing today.  "just one of those days" O2 at initiation of treatment was 76% but patient had just exchanged [employer] unit for portable tank at 4 liters.  She recovered to 91% within 2 min.    Patient is accompained by: Family member    Pertinent History COPD, Pulmonary Htn, Diastolic heart failure    Limitations Standing;Walking    How long can you sit comfortably? unlimited    How long can you stand comfortably? 15 min    How long can you walk comfortably? about 30-40 ft    Patient Stated Goals To be able to do more at home like walking and cooking without being exhausted.    Currently in Pain? No/denies                               [facility] Adult PT Treatment/Exercise - 08/24/21 0001       Knee/Hip Exercises: Aerobic   Nustep Lev 1 2 x 3 min monitoring O2 and perceived exertion   3 min sets: set 1 : O2 to 83%, recovery 2 min, 9 sec,  set 2: 80%, recovery 2 min  40 sec     Knee/Hip Exercises: Standing   Heel Raises Both;10 reps;2 sets    Heel Raises Limitations O2 to 82%, rcovery to 90% in 3 min    Hip Abduction Stengthening;Both;1 set;10 reps    Abduction Limitations O2 to 84%, recovery to 90% in 2 min, 27 sec    Hip Extension Stengthening;Both;1 set;10 reps    Extension Limitations O2 to 80%, recovery to 90% in 3 min 13 sec    Functional Squat 20 reps    Functional Squat Limitations O2 to 86%, recovery to 90% in      Knee/Hip Exercises: Seated   Long Arc Quad Strengthening;Both;1 set;10 reps   O2: 90%                      PT Short Term Goals - 08/04/21 1215       PT SHORT TERM GOAL #1   Title Patient to be independent with initial HEP    Time 4    Period Weeks    Status New    Target Date 09/01/21  PT SHORT TERM GOAL #2   Title Patient to be able to walk 30 feet and maintain O2 sats at 85% or above with 4 liters O2    Time 4    Period Weeks    Status New    Target Date 09/01/21               PT Long Term Goals - 08/04/21 1219       PT LONG TERM GOAL #1   Title Independence with advanced HEP    Time 8    Period Weeks    Status New    Target Date 09/29/21      PT LONG TERM GOAL #2   Title Patient to be able to stand erect to cook vs. leaning on counter x 30 min    Time 8    Period Weeks    Status New    Target Date 09/29/21      PT LONG TERM GOAL #3   Title Patient to be able to ambulate 100 feet with continuous O2 maintaining O2 sats at 85% or above.    Time 8    Period Weeks    Status New    Target Date 09/29/21      PT LONG TERM GOAL #4   Title Patient to be able to decreased O2 level to 3 liters when at rest.    Time 8    Period Weeks    Status New    Target Date 09/29/21      PT LONG TERM GOAL #5   Title Patient to be able to demonstrate proper diaphragmatic breathing independently    Time 8    Period Weeks    Status New    Target Date 09/29/21                    Plan - 08/24/21 1057     Clinical Impression Statement Imelda was able to do an additional set of heel raises today and recovery rate was 2.5 to 3 min average.  She is well motivated and is compliant with her HEP.  Progress may be slow.    Personal Factors and Comorbidities Age;Comorbidity 1;Comorbidity 2;Comorbidity 3+    Comorbidities COPD, Htn, Diastolic Heart failure, pulmonary htn    Examination-Activity Limitations Bathing;Dressing;Bend;Lift;Squat;Stairs;Stand;Carry    Examination-Participation Restrictions Laundry;Cleaning;Shop;Community Activity;Meal Prep    Stability/Clinical Decision Making Evolving/Moderate complexity    Clinical Decision Making Moderate    Rehab Potential Fair    PT Frequency 2x / week    PT Duration 8 weeks    PT Treatment/Interventions ADLs/Self Care Home Management;Aquatic Therapy;Canalith Repostioning;Traction;Moist Heat;Iontophoresis 4mg /ml Dexamethasone;Electrical Stimulation;Cryotherapy;Ultrasound;DME Instruction;Gait training;Therapeutic exercise;Therapeutic activities;Functional mobility training;Stair training;Balance training;Neuromuscular re-education;Patient/family education;Manual techniques;Wheelchair mobility training;Passive range of motion;Vestibular;Vasopneumatic Device;Taping;Energy conservation;Dry needling    PT Next Visit Plan Continue to monitor O2 sats with various activity and as we progress exercises.  Keep hands warm using moist heat pack or patients hand warming device throughout session to obtain accurate readings.  .    PT Home Exercise Plan continue to practice diaphragmatic breathing and monitor O2 sats with bicycling    Consulted and Agree with Plan of Care Patient;Family member/caregiver    Family Member Consulted Daugther: Izora Gala             Patient will benefit from skilled therapeutic intervention in order to improve the following deficits and impairments:  Abnormal gait, Decreased balance, Decreased endurance, Decreased mobility,  Difficulty walking, Cardiopulmonary status limiting activity, Increased  edema, Postural dysfunction, Impaired flexibility, Decreased strength, Decreased safety awareness, Decreased activity tolerance  Visit Diagnosis: Muscle weakness (generalized)  Difficulty in walking, not elsewhere classified  Chronic obstructive pulmonary disease, unspecified COPD type [facility])     Problem List Patient Active Problem List   Diagnosis Date Noted   Right epiretinal membrane 11/24/2020   Exudative age-related macular degeneration of left eye with active choroidal neovascularization ([place]) 11/26/2019   Left epiretinal membrane 11/26/2019   Intermediate stage nonexudative age-related macular degeneration of both eyes 11/26/2019   Pseudophakia 11/26/2019   Posterior vitreous detachment of both eyes 11/26/2019   COPD  GOLD I 01/09/2019   Cor pulmonale (chronic) ([place]) 01/09/2019   Chronic respiratory failure with hypoxia and hypercapnia ([place]) 01/09/2019   Postinflammatory pulmonary fibrosis ([place]) 01/09/2019   DOE (dyspnea on exertion) 01/08/2019   COPD exacerbation ([place]) 10/27/2018   Essential hypertension 10/27/2018   Acute respiratory failure ([place]) 10/27/2018   Hypomagnesemia 10/27/2018   Hypokalemia 10/27/2018    Omelia Marquart B. Danner Paulding, PT 08/24/2309:04 AM   [facility] @ [place], [place], [address] Phone: [phone]   Fax:  [phone]  Name: LANAJA VILLELA MRN: [id number] Date of Birth: 04-Aug-1926

## 2021-08-27 ENCOUNTER — Encounter: Payer: Self-pay | Admitting: Rehabilitative and Restorative Service Providers"

## 2021-08-27 ENCOUNTER — Other Ambulatory Visit: Payer: Self-pay

## 2021-08-27 ENCOUNTER — Ambulatory Visit: Payer: Medicare PPO | Admitting: Rehabilitative and Restorative Service Providers"

## 2021-08-27 DIAGNOSIS — M6281 Muscle weakness (generalized): Secondary | ICD-10-CM

## 2021-08-27 DIAGNOSIS — R262 Difficulty in walking, not elsewhere classified: Secondary | ICD-10-CM

## 2021-08-27 DIAGNOSIS — J449 Chronic obstructive pulmonary disease, unspecified: Secondary | ICD-10-CM

## 2021-08-27 NOTE — Therapy (Signed)
[facility] @ [place], [place], [address] Phone: [phone]   Fax:  [phone]  Physical Therapy Treatment  Patient Details  Name: [name] MRN: [id number] Date of Birth: 10-May-1926 Referring Provider (PT): Candace Cruise Mannam   Encounter Date: 08/27/2021   PT End of Session - 08/27/21 0858     Visit Number 6    Date for PT Re-Evaluation 09/29/21    Authorization Type [employer]    Authorization Time Period 08/04/21-09/29/21    Authorization - Visit Number 6    Authorization - Number of Visits 16    PT Start Time 0848    PT Stop Time 0930    PT Time Calculation (min) 42 min    Activity Tolerance Other (comment)   limited by O2 sats/dyspnea   Behavior During Therapy WFL for tasks assessed/performed             Past Medical History:  Diagnosis Date   COPD (chronic obstructive pulmonary disease) ([place])    Hypertension     History reviewed. No pertinent surgical history.  There were no vitals filed for this visit.   Subjective Assessment - 08/27/21 0857     Subjective Pt reports feeling rushed this morning and is having some dyspnea.  O2 sat of 76% initially after switching to portable tank and transferring to Nustep, takes 8 min to increase to 90% with 4L on portable O2 tank.    Pertinent History COPD, Pulmonary Htn, Diastolic heart failure    Patient Stated Goals To be able to do more at home like walking and cooking without being exhausted.    Currently in Pain? No/denies                               [facility] Adult PT Treatment/Exercise - 08/27/21 0001       Ambulation/Gait   Ambulation/Gait Yes    Ambulation/Gait Assistance 5: Supervision    Ambulation Distance (Feet) 45 Feet    Assistive device None    Gait Comments O2 sats drop to 76% following ambulation, takes 4.5 min to increase to 88%.      Knee/Hip Exercises: Aerobic   Nustep Lev 1 2 x 3 min monitoring O2  and perceived exertion   After 1st 3 min:  O2 sats of 85%, takes 3 min to recovery to 90%.  After 2nd 3 min:  O2 sats of 85%, takes 2 min 17 sec to recover to 90%.     Knee/Hip Exercises: Standing   Heel Raises Both;10 reps;2 sets    Heel Raises Limitations Initially following 82%, takes 4.5 min to recover to 90%.    Functional Squat 20 reps    Functional Squat Limitations O2 at 86%, takes 3 min 29 sec to increase to 90%.      Knee/Hip Exercises: Seated   Long Arc Quad Strengthening;Both;1 set;10 reps   O2 sats 92% following.                      PT Short Term Goals - 08/27/21 0944       PT SHORT TERM GOAL #1   Title Patient to be independent with initial HEP    Status Partially Met      PT SHORT TERM GOAL #2   Title Patient to be able to walk 30 feet and maintain O2 sats at 85% or above  with 4 liters O2    Status On-going               PT Long Term Goals - 08/04/21 1219       PT LONG TERM GOAL #1   Title Independence with advanced HEP    Time 8    Period Weeks    Status New    Target Date 09/29/21      PT LONG TERM GOAL #2   Title Patient to be able to stand erect to cook vs. leaning on counter x 30 min    Time 8    Period Weeks    Status New    Target Date 09/29/21      PT LONG TERM GOAL #3   Title Patient to be able to ambulate 100 feet with continuous O2 maintaining O2 sats at 85% or above.    Time 8    Period Weeks    Status New    Target Date 09/29/21      PT LONG TERM GOAL #4   Title Patient to be able to decreased O2 level to 3 liters when at rest.    Time 8    Period Weeks    Status New    Target Date 09/29/21      PT LONG TERM GOAL #5   Title Patient to be able to demonstrate proper diaphragmatic breathing independently    Time 8    Period Weeks    Status New    Target Date 09/29/21                   Plan - 08/27/21 0940     Clinical Impression Statement [name] continues to have main impairment of decreased O2  sats during functional activities.  She required increased time to return O2 sats to 90% today during ther ex than last visit.  Pt continues with great motivation and desire to progress with increased activity tolerance.    Personal Factors and Comorbidities Age;Comorbidity 1;Comorbidity 2;Comorbidity 3+    Comorbidities COPD, Htn, Diastolic Heart failure, pulmonary htn    PT Treatment/Interventions ADLs/Self Care Home Management;Aquatic Therapy;Canalith Repostioning;Traction;Moist Heat;Iontophoresis 59m/ml Dexamethasone;Electrical Stimulation;Cryotherapy;Ultrasound;DME Instruction;Gait training;Therapeutic exercise;Therapeutic activities;Functional mobility training;Stair training;Balance training;Neuromuscular re-education;Patient/family education;Manual techniques;Wheelchair mobility training;Passive range of motion;Vestibular;Vasopneumatic Device;Taping;Energy conservation;Dry needling    PT Next Visit Plan Continue to monitor O2 sats with various activity and as we progress exercises.  Keep hands warm using moist heat pack or patients hand warming device throughout session to obtain accurate readings.  .Geanie Cooleyand Agree with Plan of Care Patient;Family member/caregiver    Family Member Consulted [name]            Patient will benefit from skilled therapeutic intervention in order to improve the following deficits and impairments:  Abnormal gait, Decreased balance, Decreased endurance, Decreased mobility, Difficulty walking, Cardiopulmonary status limiting activity, Increased edema, Postural dysfunction, Impaired flexibility, Decreased strength, Decreased safety awareness, Decreased activity tolerance  Visit Diagnosis: Muscle weakness (generalized)  Difficulty in walking, not elsewhere classified  Chronic obstructive pulmonary disease, unspecified COPD type ([facility]     Problem List Patient Active Problem List   Diagnosis Date Noted   Right epiretinal membrane  11/24/2020   Exudative age-related macular degeneration of left eye with active choroidal neovascularization ([place] 11/26/2019   Left epiretinal membrane 11/26/2019   Intermediate stage nonexudative age-related macular degeneration of both eyes 11/26/2019   Pseudophakia 11/26/2019   Posterior vitreous detachment of  both eyes 11/26/2019   COPD  GOLD I 01/09/2019   Cor pulmonale (chronic) ([facility]) 01/09/2019   Chronic respiratory failure with hypoxia and hypercapnia ([place]) 01/09/2019   Postinflammatory pulmonary fibrosis ([place]) 01/09/2019   DOE (dyspnea on exertion) 01/08/2019   COPD exacerbation ([place]) 10/27/2018   Essential hypertension 10/27/2018   Acute respiratory failure ([place]) 10/27/2018   Hypomagnesemia 10/27/2018   Hypokalemia 10/27/2018    Juel Burrow, PT, DPT 08/27/2021, 9:44 AM  [facility] @ [place], [place], [address] Phone: [phone]   Fax:  [phone]  Name: [name] MRN: [id number] Date of Birth: 09-Aug-1926

## 2021-09-01 ENCOUNTER — Ambulatory Visit: Payer: Medicare PPO

## 2021-09-01 ENCOUNTER — Other Ambulatory Visit: Payer: Self-pay

## 2021-09-01 DIAGNOSIS — M6281 Muscle weakness (generalized): Secondary | ICD-10-CM

## 2021-09-01 DIAGNOSIS — R262 Difficulty in walking, not elsewhere classified: Secondary | ICD-10-CM | POA: Diagnosis not present

## 2021-09-01 DIAGNOSIS — J449 Chronic obstructive pulmonary disease, unspecified: Secondary | ICD-10-CM | POA: Diagnosis not present

## 2021-09-01 NOTE — Patient Instructions (Signed)
Encouraged patient to walk with walker or wheelchair with O2 tank in seat to allow her to work on her cardio without fatiguing so easily.  We also discussed possibly increasing O2 to 5 liters due to sats dropping with even minimal activity and long recovery.  Patient will consult with pulmonologist to see if this is recommended.

## 2021-09-01 NOTE — Therapy (Signed)
[facility] @ [place], [place], [address] Phone: [phone]   Fax:  [phone]  Physical Therapy Treatment  Patient Details  Name: [name] MRN: [id number] Date of Birth: November 03, 1925 Referring Provider (PT): Candace Cruise Mannam   Encounter Date: 09/01/2021   PT End of Session - 09/01/21 1158     Visit Number 7    Date for PT Re-Evaluation 09/29/21    Authorization Type [employer]    Authorization Time Period 08/04/21-09/29/21    Authorization - Visit Number 7    Authorization - Number of Visits 16    PT Start Time 1011    PT Stop Time 1100    PT Time Calculation (min) 49 min    Equipment Utilized During Treatment Oxygen    Activity Tolerance Other (comment)   limited by O2 sats and fatigue   Behavior During Therapy [facility] for tasks assessed/performed             Past Medical History:  Diagnosis Date   COPD (chronic obstructive pulmonary disease) ([place])    Hypertension     History reviewed. No pertinent surgical history.  There were no vitals filed for this visit.   Subjective Assessment - 09/01/21 1017     Subjective Patient reports no issues or problems since last visit.  She feels the recovery time is improving when she works out at home.    Patient is accompained by: Family member    Pertinent History COPD, Pulmonary Htn, Diastolic heart failure    Limitations Standing;Walking    How long can you sit comfortably? unlimited    How long can you stand comfortably? 15 min    How long can you walk comfortably? about 30-40 ft    Patient Stated Goals To be able to do more at home like walking and cooking without being exhausted.                               [facility] Adult PT Treatment/Exercise - 09/01/21 0001       Ambulation/Gait   Ambulation/Gait Yes    Ambulation/Gait Assistance 5: Supervision    Ambulation Distance (Feet) 120 Feet    Assistive device Rolling walker    pushed wheelchair with O2 tank to allow for increased walking distance.     Knee/Hip Exercises: Aerobic   Nustep Lev 1 2 x 3 min monitoring O2 and perceived exertion   After 1st 3 min:  O2 sats of 80%, takes 3 min to recovery to 90%.  After 2nd 3 min:  O2 sats of 83%, takes 3 min, 5 sec to recover to 90%.     Knee/Hip Exercises: Machines for Strengthening   Hip Cybex 25 lb hip abd and ext x 10 O2 to 82%      Knee/Hip Exercises: Seated   Sit to Sand 1 set;10 reps;without UE support      Ambulation   Ambulation/Gait Assistance Details Other (comment)   Walked 87 feet : O2 to 80% Recovery to 93% in 2 min 30 sec, 2nd attempt 120 ft O2 to 75%, Recovery to 90% in 2 min 35 sec.                      PT Short Term Goals - 08/27/21 0944       PT SHORT TERM GOAL #1   Title Patient to be independent  with initial HEP    Status Partially Met      PT SHORT TERM GOAL #2   Title Patient to be able to walk 30 feet and maintain O2 sats at 85% or above with 4 liters O2    Status On-going               PT Long Term Goals - 08/04/21 1219       PT LONG TERM GOAL #1   Title Independence with advanced HEP    Time 8    Period Weeks    Status New    Target Date 09/29/21      PT LONG TERM GOAL #2   Title Patient to be able to stand erect to cook vs. leaning on counter x 30 min    Time 8    Period Weeks    Status New    Target Date 09/29/21      PT LONG TERM GOAL #3   Title Patient to be able to ambulate 100 feet with continuous O2 maintaining O2 sats at 85% or above.    Time 8    Period Weeks    Status New    Target Date 09/29/21      PT LONG TERM GOAL #4   Title Patient to be able to decreased O2 level to 3 liters when at rest.    Time 8    Period Weeks    Status New    Target Date 09/29/21      PT LONG TERM GOAL #5   Title Patient to be able to demonstrate proper diaphragmatic breathing independently    Time 8    Period Weeks    Status New    Target Date  09/29/21                   Plan - 09/01/21 1159     Clinical Impression Statement [name] continues to drop to low 80's and occasionally below 80 O2 sat with trying to increase activity.  She is currently on 4 liters of O2 but may benefit from increased concentration to 5 liters.  She is well motivated and is eager to progress but O2 sats drop easily and recovery takes around 2-3 min to return to 90%.  She would benefit from using rollator walker or pushing wheelchair with portable O2 when walking to allow her to work on endurance and avoid having to manage O2 tubing.  She is fairly agile and has good balance but the pulmonary limitations are what restricts her activity.    Personal Factors and Comorbidities Age;Comorbidity 1;Comorbidity 2;Comorbidity 3+    Comorbidities COPD, Htn, Diastolic Heart failure, pulmonary htn    Examination-Activity Limitations Bathing;Dressing;Bend;Lift;Squat;Stairs;Stand;Carry    Examination-Participation Restrictions Laundry;Cleaning;Shop;Community Activity;Meal Prep    Stability/Clinical Decision Making Evolving/Moderate complexity    Clinical Decision Making Moderate    Rehab Potential Fair    PT Frequency 2x / week    PT Duration 8 weeks    PT Treatment/Interventions ADLs/Self Care Home Management;Aquatic Therapy;Canalith Repostioning;Traction;Moist Heat;Iontophoresis 73m/ml Dexamethasone;Electrical Stimulation;Cryotherapy;Ultrasound;DME Instruction;Gait training;Therapeutic exercise;Therapeutic activities;Functional mobility training;Stair training;Balance training;Neuromuscular re-education;Patient/family education;Manual techniques;Wheelchair mobility training;Passive range of motion;Vestibular;Vasopneumatic Device;Taping;Energy conservation;Dry needling    PT Next Visit Plan Continue to monitor O2 sats with various activity and as we progress exercises.  Keep hands warm using moist heat pack or patients hand warming device throughout session to obtain  accurate readings.  .    PT Home Exercise Plan continue to practice diaphragmatic breathing  and monitor O2 sats with bicycling    Consulted and Agree with Plan of Care Patient;Family member/caregiver    Family Member Consulted 2 daughters attended today             Patient will benefit from skilled therapeutic intervention in order to improve the following deficits and impairments:  Abnormal gait, Decreased balance, Decreased endurance, Decreased mobility, Difficulty walking, Cardiopulmonary status limiting activity, Increased edema, Postural dysfunction, Impaired flexibility, Decreased strength, Decreased safety awareness, Decreased activity tolerance  Visit Diagnosis: Muscle weakness (generalized)  Difficulty in walking, not elsewhere classified  Chronic obstructive pulmonary disease, unspecified COPD type [facility])     Problem List Patient Active Problem List   Diagnosis Date Noted   Right epiretinal membrane 11/24/2020   Exudative age-related macular degeneration of left eye with active choroidal neovascularization ([place]) 11/26/2019   Left epiretinal membrane 11/26/2019   Intermediate stage nonexudative age-related macular degeneration of both eyes 11/26/2019   Pseudophakia 11/26/2019   Posterior vitreous detachment of both eyes 11/26/2019   COPD  GOLD I 01/09/2019   Cor pulmonale (chronic) ([place]) 01/09/2019   Chronic respiratory failure with hypoxia and hypercapnia ([place]) 01/09/2019   Postinflammatory pulmonary fibrosis ([place]) 01/09/2019   DOE (dyspnea on exertion) 01/08/2019   COPD exacerbation ([place]) 10/27/2018   Essential hypertension 10/27/2018   Acute respiratory failure ([place]) 10/27/2018   Hypomagnesemia 10/27/2018   Hypokalemia 10/27/2018   Nawal Burling B. Polly Barner, PT 09/01/2310:07 PM   [facility] @ [place], [place], [address] Phone: [phone]   Fax:  [phone]  Name: [name] MRN:  [id number] Date of Birth: 06-Oct-1925

## 2021-09-03 ENCOUNTER — Other Ambulatory Visit: Payer: Self-pay

## 2021-09-03 ENCOUNTER — Ambulatory Visit: Payer: Medicare PPO

## 2021-09-03 DIAGNOSIS — J449 Chronic obstructive pulmonary disease, unspecified: Secondary | ICD-10-CM

## 2021-09-03 DIAGNOSIS — M6281 Muscle weakness (generalized): Secondary | ICD-10-CM | POA: Diagnosis not present

## 2021-09-03 DIAGNOSIS — R262 Difficulty in walking, not elsewhere classified: Secondary | ICD-10-CM

## 2021-09-03 NOTE — Therapy (Signed)
[facility] @ [place], [place], [address] Phone: [phone]   Fax:  [phone]  Physical Therapy Treatment  Patient Details  Name: [name] MRN: [id number] Date of Birth: 1926/04/01 Referring Provider (PT): Candace Cruise Mannam   Encounter Date: 09/03/2021   PT End of Session - 09/03/21 1152     Visit Number 8    Date for PT Re-Evaluation 09/29/21    Authorization Type [employer]    Authorization Time Period 08/04/21-09/29/21    Authorization - Visit Number 8    Authorization - Number of Visits 16    PT Start Time 2778    PT Stop Time 1102    PT Time Calculation (min) 47 min    Equipment Utilized During Treatment Oxygen    Activity Tolerance Patient limited by fatigue    Behavior During Therapy [facility] for tasks assessed/performed             Past Medical History:  Diagnosis Date   COPD (chronic obstructive pulmonary disease) ([place])    Hypertension     History reviewed. No pertinent surgical history.  There were no vitals filed for this visit.   Subjective Assessment - 09/03/21 1022     Subjective Patient states she did a lot of walking yesterday.  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  She also tried to do more around the house.    Patient is accompained by: Family member    Pertinent History COPD, Pulmonary Htn, Diastolic heart failure    Limitations Standing;Walking    How long can you sit comfortably? unlimited    How long can you stand comfortably? 15 min    How long can you walk comfortably? about 30-40 ft    Patient Stated Goals To be able to do more at home like walking and cooking without being exhausted.                               [facility] Adult PT Treatment/Exercise - 09/03/21 0001       Ambulation/Gait   Ambulation/Gait Yes    Ambulation/Gait Assistance 5: Supervision    Ambulation/Gait Assistance Details 100  ft, then 60 ft on second attempt    Ambulation Distance (Feet) 100 Feet    Assistive device Rolling walker   pushed wheelchair with O2 tank to allow for increased walking distance.   Gait Comments O2 rate at 5 liters today; O2 drops to 82%, patient takes approx 3 min to rec      Knee/Hip Exercises: Aerobic   Nustep Lev 1 2 x 3 min monitoring O2 and perceived exertion   After 1st 3 min:  O2 sats of 84%, drops to 82%, takes 1 min 58 sec to recover to 90%.  After 2nd 3 min:  O2 sats of 83%, drops to 82% takes 1 min 45 sec to recover to 90%.     Knee/Hip Exercises: Machines for Strengthening   Cybex Leg Press 30 lb both x 20    Hip Cybex 40 lb hip abd and ext x 10 O2 to 82%                     PT Education - 09/03/21 1151     Education Details Discussed possible rehab with pulmonary rehab setting due to time constraints with outpatient ortho.  Person(s) Educated Patient   ° Methods Explanation   ° Comprehension Verbalized understanding   ° °  °  ° °  ° ° ° PT Short Term Goals - 08/27/21 0944   ° °  ° PT SHORT TERM GOAL #1  ° Title Patient to be independent with initial HEP   ° Status Partially Met   °  ° PT SHORT TERM GOAL #2  ° Title Patient to be able to walk 30 feet and maintain O2 sats at 85% or above with 4 liters O2   ° Status On-going   ° °  °  ° °  ° ° ° ° PT Long Term Goals - 08/04/21 1219   ° °  ° PT LONG TERM GOAL #1  ° Title Independence with advanced HEP   ° Time 8   ° Period Weeks   ° Status New   ° Target Date 09/29/21   °  ° PT LONG TERM GOAL #2  ° Title Patient to be able to stand erect to cook vs. leaning on counter x 30 min   ° Time 8   ° Period Weeks   ° Status New   ° Target Date 09/29/21   °  ° PT LONG TERM GOAL #3  ° Title Patient to be able to ambulate 100 feet with continuous O2 maintaining O2 sats at 85% or above.   ° Time 8   ° Period Weeks   ° Status New   ° Target Date 09/29/21   °  ° PT LONG TERM GOAL #4  ° Title Patient to be able to decreased O2 level to 3  liters when at rest.   ° Time 8   ° Period Weeks   ° Status New   ° Target Date 09/29/21   °  ° PT LONG TERM GOAL #5  ° Title Patient to be able to demonstrate proper diaphragmatic breathing independently   ° Time 8   ° Period Weeks   ° Status New   ° Target Date 09/29/21   ° °  °  ° °  ° ° ° ° ° ° ° ° Plan - 09/03/21 1153   ° ° Clinical Impression Statement [name] completed session with 5 liters continuous O2.  She did not seem to drop below 80 % with exception of her second walking distance attempt.  She did drop to 79% and then recovered within 2-3 min to 90%.   We are unable to do very many tasks in sessions due to her O2 sats being so compromised and 2-3 min recovery periods between each task.  We discussed possible pulmonary rehab with a pulmonary specialist.  Unsure if there is a program locally but she wanted to complete her time in outpatient for now.   ° Personal Factors and Comorbidities Age;Comorbidity 1;Comorbidity 2;Comorbidity 3+   ° Comorbidities COPD, Htn, Diastolic Heart failure, pulmonary htn   ° Examination-Activity Limitations Bathing;Dressing;Bend;Lift;Squat;Stairs;Stand;Carry   ° Examination-Participation Restrictions Laundry;Cleaning;Shop;Community Activity;Meal Prep   ° Stability/Clinical Decision Making Evolving/Moderate complexity   ° Clinical Decision Making Moderate   ° Rehab Potential Fair   ° PT Frequency 2x / week   ° PT Duration 8 weeks   ° PT Treatment/Interventions ADLs/Self Care Home Management;Aquatic Therapy;Canalith Repostioning;Traction;Moist Heat;Iontophoresis 4mg/ml Dexamethasone;Electrical Stimulation;Cryotherapy;Ultrasound;DME Instruction;Gait training;Therapeutic exercise;Therapeutic activities;Functional mobility training;Stair training;Balance training;Neuromuscular re-education;Patient/family education;Manual techniques;Wheelchair mobility training;Passive range of motion;Vestibular;Vasopneumatic Device;Taping;Energy conservation;Dry needling   ° PT Next Visit Plan  Continue to monitor O2 sats with   various activity and as we progress exercises.  Keep hands warm using moist heat pack or patients hand warming device throughout session to obtain accurate readings.  .    PT Home Exercise Plan continue to practice diaphragmatic breathing and monitor O2 sats with bicycling    Consulted and Agree with Plan of Care Patient;Family member/caregiver    Family Member Consulted 1 dtr in [place]             Patient will benefit from skilled therapeutic intervention in order to improve the following deficits and impairments:  Abnormal gait, Decreased balance, Decreased endurance, Decreased mobility, Difficulty walking, Cardiopulmonary status limiting activity, Increased edema, Postural dysfunction, Impaired flexibility, Decreased strength, Decreased safety awareness, Decreased activity tolerance  Visit Diagnosis: Muscle weakness (generalized)  Difficulty in walking, not elsewhere classified  Chronic obstructive pulmonary disease, unspecified COPD type [facility])     Problem List Patient Active Problem List   Diagnosis Date Noted   Right epiretinal membrane 11/24/2020   Exudative age-related macular degeneration of left eye with active choroidal neovascularization ([place]) 11/26/2019   Left epiretinal membrane 11/26/2019   Intermediate stage nonexudative age-related macular degeneration of both eyes 11/26/2019   Pseudophakia 11/26/2019   Posterior vitreous detachment of both eyes 11/26/2019   COPD  GOLD I 01/09/2019   Cor pulmonale (chronic) ([place]) 01/09/2019   Chronic respiratory failure with hypoxia and hypercapnia ([place]) 01/09/2019   Postinflammatory pulmonary fibrosis ([place]) 01/09/2019   DOE (dyspnea on exertion) 01/08/2019   COPD exacerbation ([place]) 10/27/2018   Essential hypertension 10/27/2018   Acute respiratory failure ([place]) 10/27/2018   Hypomagnesemia 10/27/2018   Hypokalemia 10/27/2018    Tommi Crepeau B. Jailyn Leeson, PT 09/03/2310:01 PM   [facility] @ [place], [place], [address] Phone: [phone]   Fax:  [phone]  Name: [name] MRN: [id number] Date of Birth: 1926-03-22

## 2021-09-08 ENCOUNTER — Ambulatory Visit: Payer: Medicare PPO

## 2021-09-08 ENCOUNTER — Other Ambulatory Visit: Payer: Self-pay

## 2021-09-08 DIAGNOSIS — M6281 Muscle weakness (generalized): Secondary | ICD-10-CM

## 2021-09-08 DIAGNOSIS — R262 Difficulty in walking, not elsewhere classified: Secondary | ICD-10-CM

## 2021-09-08 DIAGNOSIS — J449 Chronic obstructive pulmonary disease, unspecified: Secondary | ICD-10-CM | POA: Diagnosis not present

## 2021-09-08 NOTE — Therapy (Signed)
[facility] @ [place], [place], [address] Phone: [phone]   Fax:  [phone]  Physical Therapy Treatment  Patient Details  Name: [name] MRN: [id number] Date of Birth: 1926/06/16 Referring Provider (PT): Candace Cruise Mannam   Encounter Date: 09/08/2021   PT End of Session - 09/08/21 1033     Visit Number 9    Date for PT Re-Evaluation 09/29/21    Authorization Type [employer]    Authorization Time Period 08/04/21-09/29/21    Authorization - Visit Number 9    Authorization - Number of Visits 16    PT Start Time 5051    PT Stop Time 1100    PT Time Calculation (min) 42 min    Equipment Utilized During Treatment Oxygen    Activity Tolerance Patient limited by fatigue    Behavior During Therapy [facility] for tasks assessed/performed             Past Medical History:  Diagnosis Date   COPD (chronic obstructive pulmonary disease) ([place])    Hypertension     History reviewed. No pertinent surgical history.  There were no vitals filed for this visit.   Subjective Assessment - 09/08/21 1027     Subjective Patient denies any issues and is in no acute distress.    Patient is accompained by: Family member    Pertinent History COPD, Pulmonary Htn, Diastolic heart failure    Limitations Standing;Walking    How long can you sit comfortably? unlimited    How long can you stand comfortably? 15 min    How long can you walk comfortably? about 30-40 ft    Patient Stated Goals To be able to do more at home like walking and cooking without being exhausted.                               [place] Adult PT Treatment/Exercise - 09/08/21 0001       Knee/Hip Exercises: Aerobic   Nustep Lev 1 2 x 3 min monitoring O2 and perceived exertion   O2 at 88% , dropped to 85%, recovery to 90% within 2 min     Knee/Hip Exercises: Standing   Heel Raises Both;2 sets;10 reps    Hip ADduction --    Hip  Abduction Stengthening;Both;1 set;10 reps    Hip Extension Stengthening;Both;1 set;10 reps    Functional Squat 2 sets;10 reps    Other Standing Knee Exercises side stepping in parallel bars x 2 laps    Other Standing Knee Exercises fwd and reverse walking in parallel bars x 2 laps                       PT Short Term Goals - 08/27/21 0944       PT SHORT TERM GOAL #1   Title Patient to be independent with initial HEP    Status Partially Met      PT SHORT TERM GOAL #2   Title Patient to be able to walk 30 feet and maintain O2 sats at 85% or above with 4 liters O2    Status On-going               PT Long Term Goals - 08/04/21 1219       PT LONG TERM GOAL #1   Title Independence with advanced HEP    Time 8  Period Weeks    Status New    Target Date 09/29/21      PT LONG TERM GOAL #2   Title Patient to be able to stand erect to cook vs. leaning on counter x 30 min    Time 8    Period Weeks    Status New    Target Date 09/29/21      PT LONG TERM GOAL #3   Title Patient to be able to ambulate 100 feet with continuous O2 maintaining O2 sats at 85% or above.    Time 8    Period Weeks    Status New    Target Date 09/29/21      PT LONG TERM GOAL #4   Title Patient to be able to decreased O2 level to 3 liters when at rest.    Time 8    Period Weeks    Status New    Target Date 09/29/21      PT LONG TERM GOAL #5   Title Patient to be able to demonstrate proper diaphragmatic breathing independently    Time 8    Period Weeks    Status New    Target Date 09/29/21                   Plan - 09/08/21 1036     Clinical Impression Statement [name] is able to do more with 5 liters and does not de-sat as severely with activity.  She is very motivated and still tries to do things around her home and in the kitchen but admits she often has to lean on the countertops when she is fatigued.  She needs frequent rest breaks but is able to complete all tasks.   Her lowest de-sat today was 80 but she was on her feet for most of her treatment session.  She did well in parallel bars and seemed to have improved tolerance to this level of exercise.  She would benefit from skilled PT for continued pulmonary rehab.    Personal Factors and Comorbidities Age;Comorbidity 1;Comorbidity 2;Comorbidity 3+    Comorbidities COPD, Htn, Diastolic Heart failure, pulmonary htn    Examination-Activity Limitations Bathing;Dressing;Bend;Lift;Squat;Stairs;Stand;Carry    Examination-Participation Restrictions Laundry;Cleaning;Shop;Community Activity;Meal Prep    Stability/Clinical Decision Making Evolving/Moderate complexity    Clinical Decision Making Moderate    Rehab Potential Fair    PT Frequency 2x / week    PT Duration 8 weeks    PT Treatment/Interventions ADLs/Self Care Home Management;Aquatic Therapy;Canalith Repostioning;Traction;Moist Heat;Iontophoresis 64m/ml Dexamethasone;Electrical Stimulation;Cryotherapy;Ultrasound;DME Instruction;Gait training;Therapeutic exercise;Therapeutic activities;Functional mobility training;Stair training;Balance training;Neuromuscular re-education;Patient/family education;Manual techniques;Wheelchair mobility training;Passive range of motion;Vestibular;Vasopneumatic Device;Taping;Energy conservation;Dry needling    PT Next Visit Plan Continue to monitor O2 sats with various activity and as we progress exercises.  Keep hands warm using moist heat pack or patients hand warming device throughout session to obtain accurate readings.  .    PT Home Exercise Plan continue to practice diaphragmatic breathing and monitor O2 sats with bicycling    Consulted and Agree with Plan of Care Patient;Family member/caregiver    Family Member Consulted 1 dtr in [place]            Patient will benefit from skilled therapeutic intervention in order to improve the following deficits and impairments:  Abnormal gait, Decreased balance, Decreased endurance,  Decreased mobility, Difficulty walking, Cardiopulmonary status limiting activity, Increased edema, Postural dysfunction, Impaired flexibility, Decreased strength, Decreased safety awareness, Decreased activity tolerance  Visit Diagnosis: Muscle weakness (generalized)  Difficulty  in walking, not elsewhere classified  Chronic obstructive pulmonary disease, unspecified COPD type [facility])     Problem List Patient Active Problem List   Diagnosis Date Noted   Right epiretinal membrane 11/24/2020   Exudative age-related macular degeneration of left eye with active choroidal neovascularization ([place]) 11/26/2019   Left epiretinal membrane 11/26/2019   Intermediate stage nonexudative age-related macular degeneration of both eyes 11/26/2019   Pseudophakia 11/26/2019   Posterior vitreous detachment of both eyes 11/26/2019   COPD  GOLD I 01/09/2019   Cor pulmonale (chronic) ([place]) 01/09/2019   Chronic respiratory failure with hypoxia and hypercapnia ([place]) 01/09/2019   Postinflammatory pulmonary fibrosis ([place]) 01/09/2019   DOE (dyspnea on exertion) 01/08/2019   COPD exacerbation ([place]) 10/27/2018   Essential hypertension 10/27/2018   Acute respiratory failure ([place]) 10/27/2018   Hypomagnesemia 10/27/2018   Hypokalemia 10/27/2018    [place] Counterman B. Bekki Tavenner, PT 09/08/2310:53 PM   [facility] @ [place], [place], [address] Phone: [phone]   Fax:  [phone]  Name: [name] MRN: [id number] Date of Birth: Jan 26, 1926

## 2021-09-10 ENCOUNTER — Other Ambulatory Visit: Payer: Self-pay

## 2021-09-10 ENCOUNTER — Ambulatory Visit: Payer: Medicare PPO

## 2021-09-10 DIAGNOSIS — M6281 Muscle weakness (generalized): Secondary | ICD-10-CM

## 2021-09-10 DIAGNOSIS — R262 Difficulty in walking, not elsewhere classified: Secondary | ICD-10-CM | POA: Diagnosis not present

## 2021-09-10 DIAGNOSIS — J449 Chronic obstructive pulmonary disease, unspecified: Secondary | ICD-10-CM | POA: Diagnosis not present

## 2021-09-10 NOTE — Patient Instructions (Signed)
Use rollator walker or transport chair at home on level surfaces to carry O2 tank so that you will be able to walk freely and not have to dodge the O2 tubing for walking distance work.  Use concentrator for normal routine part of the day but for doing her walking regimen, this may allow her to increase her distance and pace without having to work around the tubing.

## 2021-09-10 NOTE — Therapy (Signed)
[facility] @ [place], [place], [address] Phone: [phone]   Fax:  [phone]  Physical Therapy Treatment  Patient Details  Name: [name] MRN: [id number] Date of Birth: 1926/02/06 Referring Provider (PT): Candace Cruise Mannam   Encounter Date: 09/10/2021   PT End of Session - 09/10/21 1032     Visit Number 10    Date for PT Re-Evaluation 09/29/21    Authorization Type [employer]    Authorization Time Period 08/04/21-09/29/21    Authorization - Visit Number 10    Authorization - Number of Visits 16    PT Start Time 5974    PT Stop Time 1102    PT Time Calculation (min) 47 min    Equipment Utilized During Treatment Oxygen    Activity Tolerance Patient limited by fatigue    Behavior During Therapy [employer] for tasks assessed/performed             Past Medical History:  Diagnosis Date   COPD (chronic obstructive pulmonary disease) ([place])    Hypertension     History reviewed. No pertinent surgical history.  There were no vitals filed for this visit.   Subjective Assessment - 09/10/21 1022     Subjective Patient states she is "feeling fine" today.    Patient is accompained by: Family member    Pertinent History COPD, Pulmonary Htn, Diastolic heart failure    Limitations Standing;Walking    How long can you sit comfortably? unlimited    How long can you stand comfortably? 15 min    How long can you walk comfortably? about 30-40 ft    Patient Stated Goals To be able to do more at home like walking and cooking without being exhausted.    Currently in Pain? No/denies                               [facility] Adult PT Treatment/Exercise - 09/10/21 0001       Ambulation/Gait   Ambulation/Gait Yes    Ambulation/Gait Assistance 5: Supervision    Ambulation/Gait Assistance Details 137 ft dropped to 77% but recovered to 90% within 3 min    Ambulation Distance (Feet) 137 Feet     Assistive device Rolling walker   pushed wheelchair with O2 tank to allow for increased walking distance.     Knee/Hip Exercises: Aerobic   Nustep Lev 1 2 x 3 min monitoring O2 and perceived exertion   O2 at 88% , dropped to 85%, recovery to 90% within 2 min     Knee/Hip Exercises: Standing   Heel Raises Both;2 sets;10 reps    Hip Abduction Stengthening;Both;1 set;10 reps    Hip Extension Stengthening;Both;1 set;10 reps    Functional Squat 2 sets;10 reps                       PT Short Term Goals - 08/27/21 0944       PT SHORT TERM GOAL #1   Title Patient to be independent with initial HEP    Status Partially Met      PT SHORT TERM GOAL #2   Title Patient to be able to walk 30 feet and maintain O2 sats at 85% or above with 4 liters O2    Status On-going               PT Long Term Goals -  08/04/21 1219       PT LONG TERM GOAL #1   Title Independence with advanced HEP    Time 8    Period Weeks    Status New    Target Date 09/29/21      PT LONG TERM GOAL #2   Title Patient to be able to stand erect to cook vs. leaning on counter x 30 min    Time 8    Period Weeks    Status New    Target Date 09/29/21      PT LONG TERM GOAL #3   Title Patient to be able to ambulate 100 feet with continuous O2 maintaining O2 sats at 85% or above.    Time 8    Period Weeks    Status New    Target Date 09/29/21      PT LONG TERM GOAL #4   Title Patient to be able to decreased O2 level to 3 liters when at rest.    Time 8    Period Weeks    Status New    Target Date 09/29/21      PT LONG TERM GOAL #5   Title Patient to be able to demonstrate proper diaphragmatic breathing independently    Time 8    Period Weeks    Status New    Target Date 09/29/21                   Plan - 09/10/21 1034     Clinical Impression Statement Patient reached 92% O2 sat after 2nd 3 min on NuStep, dropped to 81% then recovered to 90% within 3 min.  She is very motivated but  is just very limited by stage of COPD.  We have actually had to go up on her O2 level to 5 liters but she is able to do more with that concentration.  She was able to walk 137 feet today but dropped to 77% after approx 30 sec rest.  She recovered to 88-90 in    Personal Factors and Comorbidities Age;Comorbidity 1;Comorbidity 2;Comorbidity 3+    Comorbidities COPD, Htn, Diastolic Heart failure, pulmonary htn    Examination-Activity Limitations Bathing;Dressing;Bend;Lift;Squat;Stairs;Stand;Carry    Examination-Participation Restrictions Laundry;Cleaning;Shop;Community Activity;Meal Prep    Stability/Clinical Decision Making Evolving/Moderate complexity    Clinical Decision Making Moderate    Rehab Potential Fair    PT Frequency 2x / week    PT Duration 8 weeks    PT Treatment/Interventions ADLs/Self Care Home Management;Aquatic Therapy;Canalith Repostioning;Traction;Moist Heat;Iontophoresis 8m/ml Dexamethasone;Electrical Stimulation;Cryotherapy;Ultrasound;DME Instruction;Gait training;Therapeutic exercise;Therapeutic activities;Functional mobility training;Stair training;Balance training;Neuromuscular re-education;Patient/family education;Manual techniques;Wheelchair mobility training;Passive range of motion;Vestibular;Vasopneumatic Device;Taping;Energy conservation;Dry needling    PT Next Visit Plan Continue to monitor O2 sats with various activity and as we progress exercises.  Keep hands warm using moist heat pack or patients hand warming device throughout session to obtain accurate readings.  .    PT Home Exercise Plan continue to practice diaphragmatic breathing and monitor O2 sats with bicycling    Consulted and Agree with Plan of Care Patient;Family member/caregiver    Family Member Consulted 1 dtr in [place]            Patient will benefit from skilled therapeutic intervention in order to improve the following deficits and impairments:  Abnormal gait, Decreased balance, Decreased  endurance, Decreased mobility, Difficulty walking, Cardiopulmonary status limiting activity, Increased edema, Postural dysfunction, Impaired flexibility, Decreased strength, Decreased safety awareness, Decreased activity tolerance  Visit Diagnosis: Muscle weakness (generalized)  Difficulty in walking, not elsewhere classified  Chronic obstructive pulmonary disease, unspecified COPD type [facility])     Problem List Patient Active Problem List   Diagnosis Date Noted   Right epiretinal membrane 11/24/2020   Exudative age-related macular degeneration of left eye with active choroidal neovascularization ([place]) 11/26/2019   Left epiretinal membrane 11/26/2019   Intermediate stage nonexudative age-related macular degeneration of both eyes 11/26/2019   Pseudophakia 11/26/2019   Posterior vitreous detachment of both eyes 11/26/2019   COPD  GOLD I 01/09/2019   Cor pulmonale (chronic) ([place]) 01/09/2019   Chronic respiratory failure with hypoxia and hypercapnia ([place]) 01/09/2019   Postinflammatory pulmonary fibrosis ([place]) 01/09/2019   DOE (dyspnea on exertion) 01/08/2019   COPD exacerbation ([place]) 10/27/2018   Essential hypertension 10/27/2018   Acute respiratory failure ([place]) 10/27/2018   Hypomagnesemia 10/27/2018   Hypokalemia 10/27/2018    [place] Bethune B. Nubia Ziesmer, PT 09/10/2309:09 AM   [facility] @ [place], [place], [address] Phone: [phone]   Fax:  [phone]  Name: [name] MRN: [id number] Date of Birth: 10-29-1925

## 2021-09-15 ENCOUNTER — Ambulatory Visit: Payer: Medicare PPO

## 2021-09-15 ENCOUNTER — Other Ambulatory Visit: Payer: Self-pay

## 2021-09-15 DIAGNOSIS — J449 Chronic obstructive pulmonary disease, unspecified: Secondary | ICD-10-CM

## 2021-09-15 DIAGNOSIS — R262 Difficulty in walking, not elsewhere classified: Secondary | ICD-10-CM

## 2021-09-15 DIAGNOSIS — M6281 Muscle weakness (generalized): Secondary | ICD-10-CM | POA: Diagnosis not present

## 2021-09-15 NOTE — Therapy (Signed)
[facility] @ [place] [address] [place], [place], [address] Phone: [phone]   Fax:  [phone]  Physical Therapy Treatment  Patient Details  Name: [name] MRN: [id number] Date of Birth: [birth year] Referring Provider (PT): Charolotte Capuchin Mannam   Encounter Date: 09/15/2021   PT End of Session - 09/15/21 1021     Visit Number 11    Date for PT Re-Evaluation 09/29/21    Authorization Type [employer]    Authorization Time Period 08/04/21-09/29/21    Authorization - Visit Number 10    Authorization - Number of Visits 16    PT Start Time 1015    PT Stop Time 1100    PT Time Calculation (min) 45 min    Equipment Utilized During Treatment Oxygen    Activity Tolerance Patient limited by fatigue    Behavior During Therapy [facility] for tasks assessed/performed             Past Medical History:  Diagnosis Date   COPD (chronic obstructive pulmonary disease) (HCC)    Hypertension     No past surgical history on file.  There were no vitals filed for this visit.                      [facility] Adult PT Treatment/Exercise - 09/15/21 0001       Ambulation/Gait   Ambulation/Gait Yes    Ambulation/Gait Assistance 5: Supervision    Ambulation/Gait Assistance Details 100 feet, O2 down to 79% recovered to 90% at around 3 min    Ambulation Distance (Feet) 100 Feet   100 feet x 2   Assistive device Rolling walker   pushed wheelchair with O2 tank to allow for increased walking distance.     Knee/Hip Exercises: Aerobic   Nustep Lev 1 2 x 3 min monitoring O2 and perceived exertion   O2 at 88% , dropped to 86%, recovery to 88% within 2 min 15 sec     Knee/Hip Exercises: Standing   Heel Raises Both;2 sets;10 reps    Other Standing Knee Exercises sit to stand x 10    Other Standing Knee Exercises marching x 20                       PT Short Term Goals - 09/15/21 1110       PT SHORT TERM GOAL #1    Title Patient to be independent with initial HEP    Time 4    Period Weeks    Status Achieved    Target Date 09/01/21      PT SHORT TERM GOAL #2   Title Patient to be able to walk 30 feet and maintain O2 sats at 85% or above with 4 liters O2    Time 4    Period Weeks    Status Achieved    Target Date 09/01/21               PT Long Term Goals - 09/15/21 1110       PT LONG TERM GOAL #1   Title Independence with advanced HEP    Time 8    Period Weeks    Status On-going    Target Date 09/29/21      PT LONG TERM GOAL #2   Title Patient to be able to stand erect to cook vs. leaning on counter x 30 min    Time 8  Status On-going    Target Date 09/29/21      PT LONG TERM GOAL #3   Title Patient to be able to ambulate 100 feet with continuous O2 maintaining O2 sats at 85% or above.    Time 8    Period Weeks    Status On-going    Target Date 09/29/21      PT LONG TERM GOAL #4   Title Patient to be able to decreased O2 level to 3 liters when at rest.    Time 8    Period Weeks    Status Not Met    Target Date 09/29/21      PT LONG TERM GOAL #5   Title Patient to be able to demonstrate proper diaphragmatic breathing independently    Time 8    Period Weeks    Status On-going    Target Date 09/29/21                   Plan - 09/15/21 1031     Clinical Impression Statement Patient seems to maintain sats at around mid 80's to high 80's with basic seated aerobic actiivity.  With walking approx 100 feet she drops to 78% and needed approx 3 min to recover to 90%.  Terrisha is very motivated and compliant with her daily walking and has been able to do more walking at home with using her rollator walker or transport chair to carry her portable O2 tank.  This seems to be less difficult than trying to walk around her tubing from her concentrator.  She has increased her walking tolerance to about 100 feet with a desat to around mid to low 80's and recovery of anywhere between  2-3 min.    Personal Factors and Comorbidities Age;Comorbidity 1;Comorbidity 2;Comorbidity 3+    Comorbidities COPD, Htn, Diastolic Heart failure, pulmonary htn    Examination-Activity Limitations Bathing;Dressing;Bend;Lift;Squat;Stairs;Stand;Carry    Examination-Participation Restrictions Laundry;Cleaning;Shop;Community Activity;Meal Prep    Stability/Clinical Decision Making Evolving/Moderate complexity    Clinical Decision Making Moderate    Rehab Potential Fair    PT Frequency 2x / week    PT Duration 8 weeks    PT Treatment/Interventions ADLs/Self Care Home Management;Aquatic Therapy;Canalith Repostioning;Traction;Moist Heat;Iontophoresis 4mg /ml Dexamethasone;Electrical Stimulation;Cryotherapy;Ultrasound;DME Instruction;Gait training;Therapeutic exercise;Therapeutic activities;Functional mobility training;Stair training;Balance training;Neuromuscular re-education;Patient/family education;Manual techniques;Wheelchair mobility training;Passive range of motion;Vestibular;Vasopneumatic Device;Taping;Energy conservation;Dry needling    PT Next Visit Plan Continue to monitor O2 sats with various activity and as we progress exercises.  Keep hands warm using moist heat pack or patients hand warming device throughout session to obtain accurate readings.  .    PT Home Exercise Plan continue to practice diaphragmatic breathing and monitor O2 sats with bicycling    Consulted and Agree with Plan of Care Patient;Family member/caregiver    Family Member Consulted 1 dtr in [place]             Patient will benefit from skilled therapeutic intervention in order to improve the following deficits and impairments:  Abnormal gait, Decreased balance, Decreased endurance, Decreased mobility, Difficulty walking, Cardiopulmonary status limiting activity, Increased edema, Postural dysfunction, Impaired flexibility, Decreased strength, Decreased safety awareness, Decreased activity tolerance  Visit  Diagnosis: Muscle weakness (generalized)  Difficulty in walking, not elsewhere classified  Chronic obstructive pulmonary disease, unspecified COPD type [facility])     Problem List Patient Active Problem List   Diagnosis Date Noted   Right epiretinal membrane 11/24/2020   Exudative age-related macular degeneration of left eye with active choroidal neovascularization ([place])  11/26/2019   Left epiretinal membrane 11/26/2019   Intermediate stage nonexudative age-related macular degeneration of both eyes 11/26/2019   Pseudophakia 11/26/2019   Posterior vitreous detachment of both eyes 11/26/2019   COPD  GOLD I 01/09/2019   Cor pulmonale (chronic) ([place]) 01/09/2019   Chronic respiratory failure with hypoxia and hypercapnia ([place]) 01/09/2019   Postinflammatory pulmonary fibrosis ([place]) 01/09/2019   DOE (dyspnea on exertion) 01/08/2019   COPD exacerbation ([place]) 10/27/2018   Essential hypertension 10/27/2018   Acute respiratory failure ([place]) 10/27/2018   Hypomagnesemia 10/27/2018   Hypokalemia 10/27/2018    Jermaine Tholl B. Marki Frede, PT 09/15/2309:18 AM   [facility] @ [facility], [place], [address] Phone: [phone]   Fax:  [phone]  Name: AERICA RINCON MRN: [id number] Date of Birth: Apr 03, 1926

## 2021-09-17 ENCOUNTER — Ambulatory Visit: Payer: Medicare PPO

## 2021-09-17 ENCOUNTER — Other Ambulatory Visit: Payer: Self-pay

## 2021-09-17 DIAGNOSIS — R262 Difficulty in walking, not elsewhere classified: Secondary | ICD-10-CM | POA: Diagnosis not present

## 2021-09-17 DIAGNOSIS — J449 Chronic obstructive pulmonary disease, unspecified: Secondary | ICD-10-CM

## 2021-09-17 DIAGNOSIS — M6281 Muscle weakness (generalized): Secondary | ICD-10-CM | POA: Diagnosis not present

## 2021-09-17 NOTE — Patient Instructions (Addendum)
Suggested that immediately after activity, patient allow herself time to get a few normal breaths before trying to go immediately into her diaphragmatic breathing as she tends to hyperventilate in desperation to get her O2 back up so that she can move on to her next task.

## 2021-09-17 NOTE — Therapy (Signed)
[facility] @ [facility] [place], [place], [address] Phone: [phone]   Fax:  [phone]  Physical Therapy Treatment  Patient Details  Name: [name] MRN: [id number] Date of Birth: 07/03/1926 Referring Provider (PT): Candace Cruise Mannam   Encounter Date: 09/17/2021   PT End of Session - 09/17/21 1157     Visit Number 12    Date for PT Re-Evaluation 09/29/21    Authorization Type [employer]    Authorization Time Period 08/04/21-09/29/21    Authorization - Visit Number 12    Authorization - Number of Visits 16    PT Start Time 8101    PT Stop Time 1100    PT Time Calculation (min) 45 min    Equipment Utilized During Treatment Oxygen    Activity Tolerance Patient limited by fatigue    Behavior During Therapy [facility] for tasks assessed/performed             Past Medical History:  Diagnosis Date   COPD (chronic obstructive pulmonary disease) ([place])    Hypertension     History reviewed. No pertinent surgical history.  There were no vitals filed for this visit.   Subjective Assessment - 09/17/21 1021     Subjective Patient states she doing fine.  No problems to report.    Patient is accompained by: Family member    Pertinent History COPD, Pulmonary Htn, Diastolic heart failure    Limitations Standing;Walking    How long can you sit comfortably? unlimited    How long can you stand comfortably? 15 min    How long can you walk comfortably? about 30-40 ft    Patient Stated Goals To be able to do more at home like walking and cooking without being exhausted.    Currently in Pain? No/denies                               [facility] Adult PT Treatment/Exercise - 09/17/21 0001       Ambulation/Gait   Ambulation/Gait Yes    Ambulation/Gait Assistance 5: Supervision    Ambulation/Gait Assistance Details 100 feet (80% down to 79% then 4 min 15 sec to recover to 90%)    Ambulation Distance (Feet)  100 Feet   100 feet x 2   Assistive device Rolling walker   pushed wheelchair with O2 tank to allow for increased walking distance.     Knee/Hip Exercises: Aerobic   Nustep Lev 1 2 x 3 min monitoring O2 and perceived exertion   O2 at 88% , dropped to 86%, recovery to 88% within 2 min 15 sec     Knee/Hip Exercises: Standing   Heel Raises Both;2 sets;10 reps    Hip Abduction Stengthening;Both;1 set;10 reps    Hip Extension Stengthening;Both;1 set;10 reps    Functional Squat 2 sets;10 reps                       PT Short Term Goals - 09/15/21 1110       PT SHORT TERM GOAL #1   Title Patient to be independent with initial HEP    Time 4    Period Weeks    Status Achieved    Target Date 09/01/21      PT SHORT TERM GOAL #2   Title Patient to be able to walk 30 feet and maintain O2 sats at 85% or above  with 4 liters O2    Time 4    Period Weeks    Status Achieved    Target Date 09/01/21               PT Long Term Goals - 09/15/21 1110       PT LONG TERM GOAL #1   Title Independence with advanced HEP    Time 8    Period Weeks    Status On-going    Target Date 09/29/21      PT LONG TERM GOAL #2   Title Patient to be able to stand erect to cook vs. leaning on counter x 30 min    Time 8    Status On-going    Target Date 09/29/21      PT LONG TERM GOAL #3   Title Patient to be able to ambulate 100 feet with continuous O2 maintaining O2 sats at 85% or above.    Time 8    Period Weeks    Status On-going    Target Date 09/29/21      PT LONG TERM GOAL #4   Title Patient to be able to decreased O2 level to 3 liters when at rest.    Time 8    Period Weeks    Status Not Met    Target Date 09/29/21      PT LONG TERM GOAL #5   Title Patient to be able to demonstrate proper diaphragmatic breathing independently    Time 8    Period Weeks    Status On-going    Target Date 09/29/21                   Plan - 09/17/21 1158     Clinical Impression  Statement [name] continues to hover around the same O2 levels with 5 liters of O2 with similar activities.  She has not been able to increase her walking distance beyond 100 feet without de-satting to mid to low 70's.  She is likely going to need to continue the 5 liters of O2 with activity.    Personal Factors and Comorbidities Age;Comorbidity 1;Comorbidity 2;Comorbidity 3+    Comorbidities COPD, Htn, Diastolic Heart failure, pulmonary htn    Examination-Activity Limitations Bathing;Dressing;Bend;Lift;Squat;Stairs;Stand;Carry    Examination-Participation Restrictions Laundry;Cleaning;Shop;Community Activity;Meal Prep    Stability/Clinical Decision Making Evolving/Moderate complexity    Clinical Decision Making Moderate    Rehab Potential Fair    PT Frequency 2x / week    PT Duration 8 weeks    PT Treatment/Interventions ADLs/Self Care Home Management;Aquatic Therapy;Canalith Repostioning;Traction;Moist Heat;Iontophoresis 30m/ml Dexamethasone;Electrical Stimulation;Cryotherapy;Ultrasound;DME Instruction;Gait training;Therapeutic exercise;Therapeutic activities;Functional mobility training;Stair training;Balance training;Neuromuscular re-education;Patient/family education;Manual techniques;Wheelchair mobility training;Passive range of motion;Vestibular;Vasopneumatic Device;Taping;Energy conservation;Dry needling    PT Next Visit Plan Continue to monitor O2 sats with various activity and as we progress exercises.  Keep hands warm using moist heat pack or patients hand warming device throughout session to obtain accurate readings.  .    PT Home Exercise Plan continue to practice diaphragmatic breathing and monitor O2 sats with bicycling    Consulted and Agree with Plan of Care Patient;Family member/caregiver    Family Member Consulted 1 dtr in [place]            Patient will benefit from skilled therapeutic intervention in order to improve the following deficits and impairments:  Abnormal gait,  Decreased balance, Decreased endurance, Decreased mobility, Difficulty walking, Cardiopulmonary status limiting activity, Increased edema, Postural dysfunction, Impaired flexibility, Decreased strength, Decreased safety  awareness, Decreased activity tolerance  Visit Diagnosis: Muscle weakness (generalized)  Difficulty in walking, not elsewhere classified  Chronic obstructive pulmonary disease, unspecified COPD type [facility])     Problem List Patient Active Problem List   Diagnosis Date Noted   Right epiretinal membrane 11/24/2020   Exudative age-related macular degeneration of left eye with active choroidal neovascularization ([place]) 11/26/2019   Left epiretinal membrane 11/26/2019   Intermediate stage nonexudative age-related macular degeneration of both eyes 11/26/2019   Pseudophakia 11/26/2019   Posterior vitreous detachment of both eyes 11/26/2019   COPD  GOLD I 01/09/2019   Cor pulmonale (chronic) ([place]) 01/09/2019   Chronic respiratory failure with hypoxia and hypercapnia ([place]) 01/09/2019   Postinflammatory pulmonary fibrosis ([place]) 01/09/2019   DOE (dyspnea on exertion) 01/08/2019   COPD exacerbation ([place]) 10/27/2018   Essential hypertension 10/27/2018   Acute respiratory failure ([place]) 10/27/2018   Hypomagnesemia 10/27/2018   Hypokalemia 10/27/2018    Jennifer B. Fields, PT 09/17/2310:04 PM   [facility] @ [place], [place], [address] Phone: [phone]   Fax:  [phone]  Name: [name] MRN: [id number] Date of Birth: Jan 29, 1926

## 2021-09-21 ENCOUNTER — Ambulatory Visit: Payer: Medicare PPO

## 2021-09-21 ENCOUNTER — Other Ambulatory Visit: Payer: Self-pay

## 2021-09-21 DIAGNOSIS — M6281 Muscle weakness (generalized): Secondary | ICD-10-CM | POA: Diagnosis not present

## 2021-09-21 DIAGNOSIS — J449 Chronic obstructive pulmonary disease, unspecified: Secondary | ICD-10-CM

## 2021-09-21 DIAGNOSIS — R262 Difficulty in walking, not elsewhere classified: Secondary | ICD-10-CM

## 2021-09-21 NOTE — Therapy (Signed)
[facility] @ [place], [place], [address] Phone: [phone]   Fax:  [phone]  Physical Therapy Treatment  Patient Details  Name: [name] MRN: [id number] Date of Birth: 1926-01-26 Referring Provider (PT): Candace Cruise Mannam   Encounter Date: 09/21/2021   PT End of Session - 09/21/21 0809     Visit Number 13    Date for PT Re-Evaluation 09/29/21    Authorization Type [employer]    Authorization Time Period 08/04/21-09/29/21    Authorization - Visit Number 13    Authorization - Number of Visits 16    PT Start Time 0800    Equipment Utilized During Treatment Oxygen    Activity Tolerance Patient limited by fatigue    Behavior During Therapy [facility] for tasks assessed/performed             Past Medical History:  Diagnosis Date   COPD (chronic obstructive pulmonary disease) ([place])    Hypertension     History reviewed. No pertinent surgical history.  There were no vitals filed for this visit.   Subjective Assessment - 09/21/21 0806     Subjective Patient reports no significant issues with her breathing but her legs have become weak and she is now using the rollator walker at all times at home.    Patient is accompained by: Family member    Pertinent History COPD, Pulmonary Htn, Diastolic heart failure    Limitations Standing;Walking    How long can you sit comfortably? unlimited    How long can you stand comfortably? 15 min    How long can you walk comfortably? about 30-40 ft    Patient Stated Goals To be able to do more at home like walking and cooking without being exhausted.    Currently in Pain? No/denies                               [facility] Adult PT Treatment/Exercise - 09/21/21 0001       Ambulation/Gait   Ambulation/Gait Yes    Ambulation/Gait Assistance 5: Supervision    Ambulation Distance (Feet) 100 Feet   100 feet x 2   Assistive device Rolling walker    pushed wheelchair with O2 tank to allow for increased walking distance.     Knee/Hip Exercises: Aerobic   Nustep Lev 1 2 x 3 min monitoring O2 and perceived exertion   O2 at 87% , dropped to 86%, recovery to 89% within 2 min/ 2nd 3 min interval 84% , down to 83% recovery to 90% in 2 min 15 sec.     Knee/Hip Exercises: Seated   Long Arc Quad Strengthening;Both;1 set;20 reps    Long Arc Quad Weight 4 lbs.    Clamshell with TheraBand Yellow   x 20   Marching Both;1 set;20 reps;Weights    Marching Weights 4 lbs.    Hamstring Curl Both;1 set;20 reps    Hamstring Limitations yellow band      Knee/Hip Exercises: Supine   Short Arc Quad Sets Strengthening;Both;1 set;20 reps    Short Arc Quad Sets Limitations 4 lb    [place] Strengthening;Both;4 sets;5 reps    Straight Leg Raises Strengthening;Both;2 sets    Other Supine Knee/Hip Exercises Clamshell in hooklying with yellow band x 20                       PT  Short Term Goals - 09/15/21 1110       PT SHORT TERM GOAL #1   Title Patient to be independent with initial HEP    Time 4    Period Weeks    Status Achieved    Target Date 09/01/21      PT SHORT TERM GOAL #2   Title Patient to be able to walk 30 feet and maintain O2 sats at 85% or above with 4 liters O2    Time 4    Period Weeks    Status Achieved    Target Date 09/01/21               PT Long Term Goals - 09/15/21 1110       PT LONG TERM GOAL #1   Title Independence with advanced HEP    Time 8    Period Weeks    Status On-going    Target Date 09/29/21      PT LONG TERM GOAL #2   Title Patient to be able to stand erect to cook vs. leaning on counter x 30 min    Time 8    Status On-going    Target Date 09/29/21      PT LONG TERM GOAL #3   Title Patient to be able to ambulate 100 feet with continuous O2 maintaining O2 sats at 85% or above.    Time 8    Period Weeks    Status On-going    Target Date 09/29/21      PT LONG TERM GOAL #4   Title  Patient to be able to decreased O2 level to 3 liters when at rest.    Time 8    Period Weeks    Status Not Met    Target Date 09/29/21      PT LONG TERM GOAL #5   Title Patient to be able to demonstrate proper diaphragmatic breathing independently    Time 8    Period Weeks    Status On-going    Target Date 09/29/21                   Plan - 09/21/21 0842     Clinical Impression Statement Jasmia was not able to increase her walking distance due to leg weakness.  No significant improvements functionally in the past few visits.  Her pulmonary status seems to be more compromised than initially and she admits increased edema bilaterally.  She has f/u with primary MD this week.    Personal Factors and Comorbidities Age;Comorbidity 1;Comorbidity 2;Comorbidity 3+    Comorbidities COPD, Htn, Diastolic Heart failure, pulmonary htn    Examination-Activity Limitations Bathing;Dressing;Bend;Lift;Squat;Stairs;Stand;Carry    Examination-Participation Restrictions Laundry;Cleaning;Shop;Community Activity;Meal Prep    Stability/Clinical Decision Making Evolving/Moderate complexity    Clinical Decision Making Moderate    Rehab Potential Fair    PT Frequency 2x / week    PT Duration 8 weeks    PT Treatment/Interventions ADLs/Self Care Home Management;Aquatic Therapy;Canalith Repostioning;Traction;Moist Heat;Iontophoresis 4mg /ml Dexamethasone;Electrical Stimulation;Cryotherapy;Ultrasound;DME Instruction;Gait training;Therapeutic exercise;Therapeutic activities;Functional mobility training;Stair training;Balance training;Neuromuscular re-education;Patient/family education;Manual techniques;Wheelchair mobility training;Passive range of motion;Vestibular;Vasopneumatic Device;Taping;Energy conservation;Dry needling    PT Next Visit Plan Continue to monitor O2 sats with various activity and as we progress exercises.  Keep hands warm using moist heat pack or patients hand warming device throughout session to  obtain accurate readings.  .    PT Home Exercise Plan continue to practice diaphragmatic breathing and monitor O2 sats with bicycling and walking.  Consulted and Agree with Plan of Care Patient;Family member/caregiver    Family Member Consulted 1 dtr in [place]             Patient will benefit from skilled therapeutic intervention in order to improve the following deficits and impairments:  Abnormal gait, Decreased balance, Decreased endurance, Decreased mobility, Difficulty walking, Cardiopulmonary status limiting activity, Increased edema, Postural dysfunction, Impaired flexibility, Decreased strength, Decreased safety awareness, Decreased activity tolerance  Visit Diagnosis: Muscle weakness (generalized)  Difficulty in walking, not elsewhere classified  Chronic obstructive pulmonary disease, unspecified COPD type [facility])     Problem List Patient Active Problem List   Diagnosis Date Noted   Right epiretinal membrane 11/24/2020   Exudative age-related macular degeneration of left eye with active choroidal neovascularization ([place]) 11/26/2019   Left epiretinal membrane 11/26/2019   Intermediate stage nonexudative age-related macular degeneration of both eyes 11/26/2019   Pseudophakia 11/26/2019   Posterior vitreous detachment of both eyes 11/26/2019   COPD  GOLD I 01/09/2019   Cor pulmonale (chronic) ([place]) 01/09/2019   Chronic respiratory failure with hypoxia and hypercapnia ([place]) 01/09/2019   Postinflammatory pulmonary fibrosis ([place]) 01/09/2019   DOE (dyspnea on exertion) 01/08/2019   COPD exacerbation ([place]) 10/27/2018   Essential hypertension 10/27/2018   Acute respiratory failure ([place]) 10/27/2018   Hypomagnesemia 10/27/2018   Hypokalemia 10/27/2018    Gratia Disla B. Rashed Edler, PT 01/31/238:57 AM   [facility] @ [place], [place], [address] Phone: [phone]   Fax:  [phone]  Name: SHEALEE YORDY MRN: [id number] Date of Birth: 08/07/1926

## 2021-09-23 DIAGNOSIS — I5032 Chronic diastolic (congestive) heart failure: Secondary | ICD-10-CM | POA: Diagnosis not present

## 2021-09-23 DIAGNOSIS — J449 Chronic obstructive pulmonary disease, unspecified: Secondary | ICD-10-CM | POA: Diagnosis not present

## 2021-09-23 DIAGNOSIS — E782 Mixed hyperlipidemia: Secondary | ICD-10-CM | POA: Diagnosis not present

## 2021-09-23 DIAGNOSIS — J8410 Pulmonary fibrosis, unspecified: Secondary | ICD-10-CM | POA: Diagnosis not present

## 2021-09-23 DIAGNOSIS — M85852 Other specified disorders of bone density and structure, left thigh: Secondary | ICD-10-CM | POA: Diagnosis not present

## 2021-09-23 DIAGNOSIS — I10 Essential (primary) hypertension: Secondary | ICD-10-CM | POA: Diagnosis not present

## 2021-09-23 DIAGNOSIS — I700 Atherosclerosis of aorta: Secondary | ICD-10-CM | POA: Diagnosis not present

## 2021-09-23 DIAGNOSIS — E1122 Type 2 diabetes mellitus with diabetic chronic kidney disease: Secondary | ICD-10-CM | POA: Diagnosis not present

## 2021-09-23 DIAGNOSIS — N1831 Chronic kidney disease, stage 3a: Secondary | ICD-10-CM | POA: Diagnosis not present

## 2021-09-24 ENCOUNTER — Ambulatory Visit: Payer: Medicare PPO | Attending: Pulmonary Disease

## 2021-09-24 ENCOUNTER — Other Ambulatory Visit: Payer: Self-pay

## 2021-09-24 DIAGNOSIS — R262 Difficulty in walking, not elsewhere classified: Secondary | ICD-10-CM | POA: Diagnosis not present

## 2021-09-24 DIAGNOSIS — M6281 Muscle weakness (generalized): Secondary | ICD-10-CM | POA: Diagnosis not present

## 2021-09-24 DIAGNOSIS — J449 Chronic obstructive pulmonary disease, unspecified: Secondary | ICD-10-CM | POA: Diagnosis not present

## 2021-09-24 NOTE — Therapy (Signed)
[facility] @ [facility] [address] [place], [place], [address] Phone: [phone]   Fax:  [phone]  Physical Therapy Treatment  Patient Details  Name: [name] MRN: [id number] Date of Birth: 12-03-1925 Referring Provider (PT): Charolotte Capuchin Mannam   Encounter Date: 09/24/2021   PT End of Session - 09/24/21 0938     Visit Number 14    Date for PT Re-Evaluation 09/29/21    Authorization Type [employer]    Authorization Time Period 08/04/21-09/29/21    Authorization - Visit Number 14    Authorization - Number of Visits 16    PT Start Time 0930    PT Stop Time 1025    PT Time Calculation (min) 55 min    Equipment Utilized During Treatment Oxygen    Activity Tolerance Patient limited by fatigue    Behavior During Therapy [facility] for tasks assessed/performed             Past Medical History:  Diagnosis Date   COPD (chronic obstructive pulmonary disease) (HCC)    Hypertension     History reviewed. No pertinent surgical history.  There were no vitals filed for this visit.   Subjective Assessment - 09/24/21 0934     Subjective Patient states she still has the leg discomfort but that MD felt this was something that she will have to manage with what meds and interventions he has already provided due to her age.  She states "its not that bad really"  She denies any other issues and is in no acute distress.  Dtr states that patient is no able to exit the home, descend the stoop exiting the home and walk to car and transfer into car without having to rest.  Previously she had to stop at bottom of stoop to rest, then rest at car door before transferring into car to rest.    Patient is accompained by: Family member    Pertinent History COPD, Pulmonary Htn, Diastolic heart failure    Limitations Standing;Walking    How long can you sit comfortably? unlimited    How long can you stand comfortably? 15 min    How long can you walk  comfortably? about 30-40 ft    Patient Stated Goals To be able to do more at home like walking and cooking without being exhausted.    Currently in Pain? No/denies                               [facility] Adult PT Treatment/Exercise - 09/24/21 0001       Ambulation/Gait   Ambulation/Gait Yes    Ambulation/Gait Assistance 5: Supervision    Ambulation Distance (Feet) 130 Feet   130 ft then 125 ft with approx 3 min recovery   Assistive device Rolling walker   pushed wheelchair with O2 tank to allow for increased walking distance.     Knee/Hip Exercises: Standing   Heel Raises Both;2 sets;10 reps    Hip Abduction Stengthening;Both;1 set;10 reps    Hip Extension Stengthening;Both;1 set;10 reps    Functional Squat 2 sets;10 reps    Other Standing Knee Exercises side stepping x 2 laps in parallel bars                       PT Short Term Goals - 09/15/21 1110       PT SHORT TERM GOAL #1  Title Patient to be independent with initial HEP    Time 4    Period Weeks    Status Achieved    Target Date 09/01/21      PT SHORT TERM GOAL #2   Title Patient to be able to walk 30 feet and maintain O2 sats at 85% or above with 4 liters O2    Time 4    Period Weeks    Status Achieved    Target Date 09/01/21               PT Long Term Goals - 09/15/21 1110       PT LONG TERM GOAL #1   Title Independence with advanced HEP    Time 8    Period Weeks    Status On-going    Target Date 09/29/21      PT LONG TERM GOAL #2   Title Patient to be able to stand erect to cook vs. leaning on counter x 30 min    Time 8    Status On-going    Target Date 09/29/21      PT LONG TERM GOAL #3   Title Patient to be able to ambulate 100 feet with continuous O2 maintaining O2 sats at 85% or above.    Time 8    Period Weeks    Status On-going    Target Date 09/29/21      PT LONG TERM GOAL #4   Title Patient to be able to decreased O2 level to 3 liters when at rest.     Time 8    Period Weeks    Status Not Met    Target Date 09/29/21      PT LONG TERM GOAL #5   Title Patient to be able to demonstrate proper diaphragmatic breathing independently    Time 8    Period Weeks    Status On-going    Target Date 09/29/21                   Plan - 09/24/21 1029     Clinical Impression Statement Patient met with MD.  MD unable to change any meds.  MD felt patients age and multiple co-morbidities prevent any changes to her current medication regimen.  MD suggested she continue to work on her endurance monitoring her O2 sats.  Ok to continue PT per MD until PT feels max potential met.  Patient was actually able to do more today.  Her lowest O2 sat was 78% which was at the very end of session after doing increased amount of standing exercise than previously.  She had some readings at around 94% today and recovery was back to around 2 -3 min vs. 3+ min last session.  She is due for re-assessment next visit.  She would benefit from continuing skilled PT for pulmonary rehab. We will either proceed with DC or extend based on re-assessment findings.    Personal Factors and Comorbidities Age;Comorbidity 1;Comorbidity 2;Comorbidity 3+    Comorbidities COPD, Htn, Diastolic Heart failure, pulmonary htn    Examination-Activity Limitations Bathing;Dressing;Bend;Lift;Squat;Stairs;Stand;Carry    Examination-Participation Restrictions Laundry;Cleaning;Shop;Community Activity;Meal Prep    Stability/Clinical Decision Making Evolving/Moderate complexity    Clinical Decision Making Moderate    Rehab Potential Fair    PT Frequency 2x / week    PT Duration 8 weeks    PT Treatment/Interventions ADLs/Self Care Home Management;Aquatic Therapy;Canalith Repostioning;Traction;Moist Heat;Iontophoresis 4mg /ml Dexamethasone;Electrical Stimulation;Cryotherapy;Ultrasound;DME Instruction;Gait training;Therapeutic exercise;Therapeutic activities;Functional mobility training;Stair  training;Balance training;Neuromuscular  re-education;Patient/family education;Manual techniques;Wheelchair mobility training;Passive range of motion;Vestibular;Vasopneumatic Device;Taping;Energy conservation;Dry needling    PT Next Visit Plan re-assess next visit for DC or extension.  Continue to monitor O2 sats with various activity and as we progress exercises.  Keep hands warm using moist heat pack or patients hand warming device throughout session to obtain accurate readings.  .    PT Home Exercise Plan continue to practice diaphragmatic breathing and monitor O2 sats with bicycling and walking.    Consulted and Agree with Plan of Care Patient;Family member/caregiver    Family Member Consulted 1 dtr in [place]             Patient will benefit from skilled therapeutic intervention in order to improve the following deficits and impairments:  Abnormal gait, Decreased balance, Decreased endurance, Decreased mobility, Difficulty walking, Cardiopulmonary status limiting activity, Increased edema, Postural dysfunction, Impaired flexibility, Decreased strength, Decreased safety awareness, Decreased activity tolerance  Visit Diagnosis: Muscle weakness (generalized)  Difficulty in walking, not elsewhere classified  Chronic obstructive pulmonary disease, unspecified COPD type [facility])     Problem List Patient Active Problem List   Diagnosis Date Noted   Right epiretinal membrane 11/24/2020   Exudative age-related macular degeneration of left eye with active choroidal neovascularization ([place]) 11/26/2019   Left epiretinal membrane 11/26/2019   Intermediate stage nonexudative age-related macular degeneration of both eyes 11/26/2019   Pseudophakia 11/26/2019   Posterior vitreous detachment of both eyes 11/26/2019   COPD  GOLD I 01/09/2019   Cor pulmonale (chronic) ([place]) 01/09/2019   Chronic respiratory failure with hypoxia and hypercapnia ([place]) 01/09/2019   Postinflammatory pulmonary fibrosis  ([place]) 01/09/2019   DOE (dyspnea on exertion) 01/08/2019   COPD exacerbation ([place]) 10/27/2018   Essential hypertension 10/27/2018   Acute respiratory failure ([place]) 10/27/2018   Hypomagnesemia 10/27/2018   Hypokalemia 10/27/2018    Kelii Chittum B. Jensine Luz, PT 09/24/2308:36 AM   [facility] @ [facility] [place], [place], [address] Phone: [phone]   Fax:  [phone]  Name: [name] MRN: [id number] Date of Birth: Mar 21, 1926

## 2021-09-28 ENCOUNTER — Encounter (INDEPENDENT_AMBULATORY_CARE_PROVIDER_SITE_OTHER): Payer: Self-pay | Admitting: Ophthalmology

## 2021-09-28 ENCOUNTER — Ambulatory Visit (INDEPENDENT_AMBULATORY_CARE_PROVIDER_SITE_OTHER): Payer: Medicare PPO | Admitting: Ophthalmology

## 2021-09-28 ENCOUNTER — Other Ambulatory Visit: Payer: Self-pay

## 2021-09-28 DIAGNOSIS — H353132 Nonexudative age-related macular degeneration, bilateral, intermediate dry stage: Secondary | ICD-10-CM

## 2021-09-28 DIAGNOSIS — H353221 Exudative age-related macular degeneration, left eye, with active choroidal neovascularization: Secondary | ICD-10-CM | POA: Diagnosis not present

## 2021-09-28 MED ORDER — BEVACIZUMAB 2.5 MG/0.1ML IZ SOSY
2.5000 mg | PREFILLED_SYRINGE | INTRAVITREAL | Status: AC | PRN
  Administered 2021-09-28: 2.5 mg via INTRAVITREAL

## 2021-09-28 NOTE — Assessment & Plan Note (Signed)
OD stable, drusenoid subfoveal region larger since patient has developed lung disease but is nonetheless stable with no sign of CNVM

## 2021-09-28 NOTE — Progress Notes (Signed)
09/28/2021     CHIEF COMPLAINT Patient presents for  Chief Complaint  Patient presents with   Macular Degeneration      HISTORY OF PRESENT ILLNESS: [name] is a 88 y.o. female who presents to the clinic today for:   HPI   8 weeks dilate OS Avastin OS OCT. Patient states vision is stable and unchanged since last visit. Denies any new floaters or FOL.  Last edited by Laurin Coder on 09/28/2021 11:00 AM.      Referring physician: Antony Contras, MD [place],  [place] [address]  HISTORICAL INFORMATION:   Selected notes from the [facility]: No current outpatient medications on file. (Ophthalmic Drugs)   No current facility-administered medications for this visit. (Ophthalmic Drugs)   Current Outpatient Medications (Other)  Medication Sig   cetirizine (ZYRTEC) 5 MG chewable tablet Chew 5 mg by mouth as needed for allergies.   diltiazem (CARDIZEM CD) 360 MG 24 hr capsule Take 360 mg by mouth daily.   furosemide (LASIX) 40 MG tablet OKAY TO TAKE EVERY OTHER DAY- RESUME TAKING DAILY IF YOU HAVE WORSENING SOB, LEG SWELLING, OR WEIGHT GAIN   metoprolol tartrate (LOPRESSOR) 25 MG tablet Take 12.5 mg by mouth daily.   Multiple Vitamins-Minerals (PRESERVISION AREDS 2) CAPS Take 1 capsule by mouth 2 (two) times daily.    OXYGEN 4 lpm with sleep and exercise [employer]   spironolactone (ALDACTONE) 25 MG tablet Take 0.5 tablets (12.5 mg total) by mouth daily.   No current facility-administered medications for this visit. (Other)      REVIEW OF SYSTEMS: ROS   Negative for: Constitutional, Gastrointestinal, Neurological, Skin, Genitourinary, Musculoskeletal, HENT, Endocrine, Cardiovascular, Eyes, Respiratory, Psychiatric, Allergic/Imm, Heme/Lymph Last edited by Hurman Horn, MD on 09/28/2021 11:37 AM.       ALLERGIES Allergies  Allergen Reactions   Bisoprolol-Hydrochlorothiazide Other (See Comments)     Other reaction(s): Cough (ALLERGY/intolerance)   Meperidine Nausea And Vomiting   Tetracycline Hives    PAST MEDICAL HISTORY Past Medical History:  Diagnosis Date   COPD (chronic obstructive pulmonary disease) ([place])    Hypertension    History reviewed. No pertinent surgical history.  FAMILY HISTORY History reviewed. No pertinent family history.  SOCIAL HISTORY Social History   Tobacco Use   Smoking status: Former    Packs/day: 0.75    Years: 17.00    Pack years: 12.75    Types: Cigarettes    Quit date: 08/22/1966    Years since quitting: 55.1   Smokeless tobacco: Former  Scientific laboratory technician Use: Never used  Substance Use Topics   Alcohol use: Not Currently   Drug use: Not Currently         OPHTHALMIC EXAM:  Base Eye Exam     Visual Acuity (ETDRS)       Right Left   Dist cc 20/30 -1 20/70 -2   Dist ph cc  NI    Correction: Glasses         Tonometry (Tonopen, 11:05 AM)       Right Left   Pressure 12 20         Pupils       Pupils Dark Light React APD   Right PERRL 2 2 Minimal None   Left PERRL 2 2 Minimal None         Visual Fields  Left Right    Full Full         Extraocular Movement       Right Left    Full Full         Neuro/Psych     Oriented x3: Yes   Mood/Affect: Normal         Dilation     Left eye: 1.0% Mydriacyl, 2.5% Phenylephrine @ 11:05 AM           Slit Lamp and Fundus Exam     External Exam       Right Left   External Normal Normal         Slit Lamp Exam       Right Left   Lids/Lashes Normal Normal   Conjunctiva/Sclera White and quiet White and quiet   Cornea Clear Clear   Anterior Chamber Deep and quiet Deep and quiet   Iris Round and reactive Round and reactive   Lens Posterior chamber intraocular lens Posterior chamber intraocular lens   Anterior Vitreous Normal Central vitreous floaters         Fundus Exam       Right Left   Posterior Vitreous Posterior vitreous  detachment Vitreous membranes, strands, Posterior vitreous detachment   Disc Normal Normal,,   C/D Ratio 0.45 0.65   Macula Retinal pigment epithelial detachment, Soft drusen, Pigmented atrophy, Epiretinal membrane, no topographic distortion Age related macular degeneration, Early age related macular degeneration, Macular thickening, Retinal pigment epithelial detachment, Membrane, Epiretinal membrane without topographic distortion, Soft drusen, Geographic atrophy   Vessels Normal Normal   Periphery Normal Normal            IMAGING AND PROCEDURES  Imaging and Procedures for 09/28/21  Intravitreal Injection, Pharmacologic Agent - OS - Left Eye       Time Out 09/28/2021. 11:38 AM. Confirmed correct patient, procedure, site, and patient consented.   Anesthesia Topical anesthesia was used. Anesthetic medications included Lidocaine 4%.   Procedure Preparation included 10% betadine to eyelids, 5% betadine to ocular surface, Ofloxacin . A 30 gauge needle was used.   Injection: 2.5 mg bevacizumab 2.5 MG/0.1ML   Route: Intravitreal, Site: Left Eye   NDC: [phone], Lot: [id number]   Post-op Post injection exam found visual acuity of at least counting fingers. The patient tolerated the procedure well. There were no complications. The patient received written and verbal post procedure care education. Post injection medications included ocuflox.      OCT, Retina - OU - Both Eyes       Right Eye Quality was good. Scan locations included subfoveal. Central Foveal Thickness: 333. Progression has been stable. Findings include pigment epithelial detachment, retinal drusen , abnormal foveal contour, no IRF, no SRF, epiretinal membrane, subretinal hyper-reflective material.   Left Eye Quality was good. Scan locations included subfoveal. Central Foveal Thickness: 290. Progression has improved. Findings include intraretinal fluid, abnormal foveal contour, no SRF, subretinal hyper-reflective  material, epiretinal membrane.   Notes Large drusenoid pigment epithelial detachment subfoveal OD, no signs of CNVM, slightly increased in thickness, as patient continues with ongoing lung disease sequela post COVID and on chronic oxygen use  , OS with epiretinal membrane yet with intraretinal fluid nasal to the fovea and improved CME at current 8-week interval examination post injection Avastin OS.  Repeat injection OS today and examination again in 8  weeks, as much improved as compared to April examination  ASSESSMENT/PLAN:  Intermediate stage nonexudative age-related macular degeneration of both eyes OD stable, drusenoid subfoveal region larger since patient has developed lung disease but is nonetheless stable with no sign of CNVM  Exudative age-related macular degeneration of left eye with active choroidal neovascularization (HCC) OS, much less active CNVM currently at 8-week interval, and stabilize acuity.  Repeat injection today Avastin and follow-up in 8 weeks     ICD-10-CM   1. 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 Intermediate stage nonexudative age-related macular degeneration of both eyes  H35.3132       1.  OS, vastly improved overall and stabilized acuity yet still some permanent acuity loss from the recent CNVM.  Currently at 8-week interval.  Dilate OU next  2.  OD no sign of CNVM  3.  Ophthalmic Meds Ordered this visit:  Meds ordered this encounter  Medications   bevacizumab (AVASTIN) SOSY 2.5 mg       Return in about 8 weeks (around 11/23/2021) for DILATE OU, AVASTIN OCT, OS.  There are no Patient Instructions on file for this visit.   Explained the diagnoses, plan, and follow up with the patient and they expressed understanding.   Patient expressed understanding of the importance of proper follow up care.   Clent Demark Nekita Pita M.D. Diseases & Surgery of the Retina and Vitreous [facility] 09/28/21     Abbreviations: M myopia (nearsighted); A astigmatism; H hyperopia (farsighted); P presbyopia; Mrx spectacle prescription;  CTL contact lenses; OD right eye; OS left eye; OU both eyes  XT exotropia; ET esotropia; PEK punctate epithelial keratitis; PEE punctate epithelial erosions; DES dry eye syndrome; MGD meibomian gland dysfunction; ATs artificial tears; PFAT's preservative free artificial tears; [place] nuclear sclerotic cataract; PSC posterior subcapsular cataract; ERM epi-retinal membrane; PVD posterior vitreous detachment; RD retinal detachment; DM diabetes mellitus; DR diabetic retinopathy; NPDR non-proliferative diabetic retinopathy; PDR proliferative diabetic retinopathy; CSME clinically significant macular edema; DME diabetic macular edema; dbh dot blot hemorrhages; CWS cotton wool spot; POAG primary open angle glaucoma; C/D cup-to-disc ratio; HVF humphrey visual field; GVF goldmann visual field; OCT optical coherence tomography; IOP intraocular pressure; BRVO Branch retinal vein occlusion; CRVO central retinal vein occlusion; CRAO central retinal artery occlusion; BRAO branch retinal artery occlusion; RT retinal tear; SB scleral buckle; PPV pars plana vitrectomy; VH Vitreous hemorrhage; PRP panretinal laser photocoagulation; IVK intravitreal kenalog; VMT vitreomacular traction; MH Macular hole;  NVD neovascularization of the disc; NVE neovascularization elsewhere; AREDS age related eye disease study; ARMD age related macular degeneration; POAG primary open angle glaucoma; EBMD epithelial/anterior basement membrane dystrophy; ACIOL anterior chamber intraocular lens; IOL intraocular lens; PCIOL posterior chamber intraocular lens; Phaco/IOL phacoemulsification with intraocular lens placement; [place] photorefractive  keratectomy; LASIK laser assisted in situ keratomileusis; HTN hypertension; DM diabetes mellitus; COPD chronic obstructive pulmonary disease

## 2021-09-28 NOTE — Assessment & Plan Note (Addendum)
OS, much less active CNVM currently at 8-week interval, and stabilize acuity.  Repeat injection today Avastin and follow-up in 8 weeks

## 2021-09-29 ENCOUNTER — Ambulatory Visit: Payer: Medicare PPO

## 2021-09-29 DIAGNOSIS — M6281 Muscle weakness (generalized): Secondary | ICD-10-CM | POA: Diagnosis not present

## 2021-09-29 DIAGNOSIS — R262 Difficulty in walking, not elsewhere classified: Secondary | ICD-10-CM | POA: Diagnosis not present

## 2021-09-29 DIAGNOSIS — J449 Chronic obstructive pulmonary disease, unspecified: Secondary | ICD-10-CM

## 2021-09-29 NOTE — Therapy (Signed)
OUTPATIENT PHYSICAL THERAPY RECERTIFICATION NOTE   Patient Name: [name] MRN: [id number] DOB:[birth year], 88 y.o., female Today's Date: 09/29/2021  PCP: Antony Contras, MD REFERRING PROVIDER: Marshell Garfinkel, MD   PT End of Session - 09/29/21 [phone]     Visit Number 15    Date for PT Re-Evaluation 10/27/21    Authorization Type [employer]    Authorization - Visit Number 15    Authorization - Number of Visits 16    PT Start Time 0930    PT Stop Time 1015    PT Time Calculation (min) 45 min    Equipment Utilized During Treatment Oxygen    Activity Tolerance Patient limited by fatigue    Behavior During Therapy WFL for tasks assessed/performed             Past Medical History:  Diagnosis Date   COPD (chronic obstructive pulmonary disease) ([place])    Hypertension    History reviewed. No pertinent surgical history. Patient Active Problem List   Diagnosis Date Noted   Right epiretinal membrane 11/24/2020   Exudative age-related macular degeneration of left eye with active choroidal neovascularization ([employer]) 11/26/2019   Left epiretinal membrane 11/26/2019   Intermediate stage nonexudative age-related macular degeneration of both eyes 11/26/2019   Pseudophakia 11/26/2019   Posterior vitreous detachment of both eyes 11/26/2019   COPD  GOLD I 01/09/2019   Cor pulmonale (chronic) ([facility]) 01/09/2019   Chronic respiratory failure with hypoxia and hypercapnia ([place]) 01/09/2019   Postinflammatory pulmonary fibrosis ([place]) 01/09/2019   DOE (dyspnea on exertion) 01/08/2019   COPD exacerbation ([place]) 10/27/2018   Essential hypertension 10/27/2018   Acute respiratory failure ([place]) 10/27/2018   Hypomagnesemia 10/27/2018   Hypokalemia 10/27/2018    REFERRING DIAG: Physical Deconditioning  THERAPY DIAG:  Muscle weakness (generalized)  Difficulty in walking, not elsewhere classified  Chronic obstructive pulmonary disease, unspecified COPD type ([place])  PERTINENT HISTORY: COPD,  CHF, Continuous O2 dependent 5 Liters  PRECAUTIONS: Fall  SUBJECTIVE: Patient states she is feeling good today.  She was able to exit her home, walk down front steps and to car, then transfer into car without resting.  She states she typically has to rest 3 different times when exiting the home and getting to the car.    PAIN:  Are you having pain? Yes NPRS scale: 0/10    TODAY'S TREATMENT:  O2 monitored throughout all activity: PT training on diaphragmatic breathing throughout session as well.   NuStep x 3 min 2 sets Walking while pushing portable O2 in transport chair x 40 feet, then 100 feet Standing in parallel bars: hip ext, hip abd, heel raises, mini squats 2 x 10 each (Patient must take 1-2 min standing or seated rest breaks throughout session, therefore treatment is limited)   PATIENT EDUCATION: Education details: Diaphragmatic breathing Person educated: Patient and Child(ren) Education method: Customer service manager Education comprehension: verbalized understanding, returned demonstration, and verbal cues required   HOME EXERCISE PROGRAM: Diagphragmatic breathing exercises with walking and recumbant bike 2 times per day.    PT Short Term Goals - 09/29/21 1033       PT SHORT TERM GOAL #1   Title Patient to be independent with initial HEP    Time 4    Period Weeks    Status Achieved    Target Date 09/01/21      PT SHORT TERM GOAL #2   Title Patient to be able to walk 30 feet and maintain O2 sats at 85% or  above with 4 liters O2    Baseline Patient able to do this with 5 liters    Time 4    Period Weeks    Status Achieved    Target Date 09/01/21              PT Long Term Goals - 09/29/21 1034       PT LONG TERM GOAL #1   Title Independence with advanced HEP    Time 8    Period Weeks    Status Achieved    Target Date 09/29/21      PT LONG TERM GOAL #2   Title Patient to be able to stand erect to cook vs. leaning on counter x 30 min    Time  12    Period Weeks    Status On-going    Target Date 10/27/21      PT LONG TERM GOAL #3   Title Patient to be able to ambulate 100 feet with continuous O2 maintaining O2 sats at 85% or above.    Time 12    Period Weeks    Status On-going    Target Date 10/27/21      PT LONG TERM GOAL #4   Title Patient to be able to decreased O2 level to 3 liters when at rest.    Time 8    Period Weeks    Status Not Met    Target Date 09/29/21      PT LONG TERM GOAL #5   Title Patient to be able to demonstrate proper diaphragmatic breathing independently    Time 12    Period Weeks    Status On-going    Target Date 10/27/21             [facility] PT Assessment - 09/29/21 0001       Assessment   Medical Diagnosis Physical deconditioning    Referring Provider (PT) Tennis Must. Praveen Mannam    Onset Date/Surgical Date 11/21/19    Hand Dominance Right    Next MD Visit 6 months    Prior Therapy not for respiratory      Precautions   Precautions Fall      Prior Function   Level of Independence Independent with basic ADLs;Independent with household mobility without device;Independent with transfers    Vocation Retired    Education officer, environmental   Overall Cognitive Status Within Functional Limits for tasks assessed      Functional Tests   Functional tests Sit to Stand      Sit to Stand   Comments independent without use of hands from wheelchair      Posture/Postural Control   Posture/Postural Control Postural limitations    Postural Limitations Rounded Shoulders;Forward head;Increased thoracic kyphosis;Posterior pelvic tilt      AROM   Overall AROM  Within functional limits for tasks performed    Overall AROM Comments WFL bilateral UE and LE      Strength   Overall Strength Within functional limits for tasks performed    Overall Strength Comments Generally 4- to 4/5 bilateral UE and LE      Ambulation/Gait   Ambulation/Gait Yes    Ambulation/Gait Assistance 6: Modified  independent (Device/Increase time)    Ambulation Distance (Feet) 130 Feet   130 ft then 125 ft with approx 3 min recovery   Assistive device Rolling walker   pushed wheelchair with O2 tank to allow for increased walking distance.  Standardized Balance Assessment   Standardized Balance Assessment Timed Up and Go Test      Timed Up and Go Test   TUG Normal TUG    Normal TUG (seconds) 16.5              Plan - 09/29/21 1009     Clinical Impression Statement Marjon demonstrates slow but steady improved tolerance to activity.  Dtr states she needs less rest breaks and is able to go out with less apprehension for MD appts and running errands.  She is able to do more standing exercises with decreased rest breaks and able to maintain her O2 at or above 85%.  She has not increased her walking distance beyond 130 feet but this is a good functional distance.  She is able to be up in her kitchen to do some light meal prep but still find herself leaning on the counter.  However, with improved breathing technique she admits, she recovers much quicker.  She seems to be able to tolerate activity better at 5 ml of O2 so we have kept her on this level consistently for therapy sessions.  She feels she is able to be more productive at home at this level as well.  She would benefit from a few more sessions of skilled PT to focus on increasing walking distance and LE strength while maintaining proper breathing techniques.  We will request 4 more visits and then plan on DC with HEP.    Personal Factors and Comorbidities Age;Comorbidity 1;Comorbidity 2;Comorbidity 3+    Comorbidities COPD, Htn, Diastolic Heart failure, pulmonary htn    Examination-Activity Limitations Bathing;Dressing;Bend;Lift;Squat;Stairs;Stand;Carry    Examination-Participation Restrictions Laundry;Cleaning;Shop;Community Activity;Meal Prep    Stability/Clinical Decision Making Evolving/Moderate complexity    Clinical Decision Making Moderate     Rehab Potential Fair    PT Frequency 1x / week    PT Duration 4 weeks    PT Treatment/Interventions ADLs/Self Care Home Management;Aquatic Therapy;Canalith Repostioning;Traction;Moist Heat;Iontophoresis 56m/ml Dexamethasone;Electrical Stimulation;Cryotherapy;Ultrasound;DME Instruction;Gait training;Therapeutic exercise;Therapeutic activities;Functional mobility training;Stair training;Balance training;Neuromuscular re-education;Patient/family education;Manual techniques;Wheelchair mobility training;Passive range of motion;Vestibular;Vasopneumatic Device;Taping;Energy conservation;Dry needling    PT Next Visit Plan We will continue 1 time per week x 4 weeks for LE strength and pulmonary rehab.    PT Home Exercise Plan continue to practice diaphragmatic breathing and monitor O2 sats with bicycling and walking.               JIsabel Caprice PT 09/29/2021, 10:41 AM

## 2021-10-01 ENCOUNTER — Ambulatory Visit: Payer: Medicare PPO

## 2021-10-01 ENCOUNTER — Other Ambulatory Visit: Payer: Self-pay

## 2021-10-01 DIAGNOSIS — M6281 Muscle weakness (generalized): Secondary | ICD-10-CM

## 2021-10-01 DIAGNOSIS — J449 Chronic obstructive pulmonary disease, unspecified: Secondary | ICD-10-CM | POA: Diagnosis not present

## 2021-10-01 DIAGNOSIS — R262 Difficulty in walking, not elsewhere classified: Secondary | ICD-10-CM | POA: Diagnosis not present

## 2021-10-01 NOTE — Therapy (Signed)
OUTPATIENT PHYSICAL THERAPY TREATMENT NOTE   Patient Name: [name] MRN: [id number] DOB:Sep 18, 1925, 88 y.o., female Today's Date: 10/01/2021  PCP: Antony Contras, MD REFERRING PROVIDER: Marshell Garfinkel, MD   PT End of Session - 10/01/21 0933     Visit Number 16    Date for PT Re-Evaluation 10/27/21    Authorization Type [employer]    Authorization Time Period 09/29/21 - 10/27/21    Authorization - Visit Number 16    Authorization - Number of Visits 19    PT Start Time 0930    Equipment Utilized During Treatment Oxygen    Activity Tolerance Patient limited by fatigue    Behavior During Therapy [facility] for tasks assessed/performed             Past Medical History:  Diagnosis Date   COPD (chronic obstructive pulmonary disease) ([place])    Hypertension    History reviewed. No pertinent surgical history. Patient Active Problem List   Diagnosis Date Noted   Right epiretinal membrane 11/24/2020   Exudative age-related macular degeneration of left eye with active choroidal neovascularization ([place]) 11/26/2019   Left epiretinal membrane 11/26/2019   Intermediate stage nonexudative age-related macular degeneration of both eyes 11/26/2019   Pseudophakia 11/26/2019   Posterior vitreous detachment of both eyes 11/26/2019   COPD  GOLD I 01/09/2019   Cor pulmonale (chronic) ([facility]) 01/09/2019   Chronic respiratory failure with hypoxia and hypercapnia ([place]) 01/09/2019   Postinflammatory pulmonary fibrosis ([place]) 01/09/2019   DOE (dyspnea on exertion) 01/08/2019   COPD exacerbation ([place]) 10/27/2018   Essential hypertension 10/27/2018   Acute respiratory failure ([place]) 10/27/2018   Hypomagnesemia 10/27/2018   Hypokalemia 10/27/2018    REFERRING DIAG: R53.81 (ICD-10-CM) - Physical deconditioning  THERAPY DIAG:  Muscle weakness (generalized)  Difficulty in walking, not elsewhere classified  Chronic obstructive pulmonary disease, unspecified COPD type ([place])  PERTINENT HISTORY:  COPD, CHF, HTN  PRECAUTIONS: Fall  SUBJECTIVE: Patient and dtr report patient having no issues.  Feeling good today.  Weather is nice out and easy getting from house into car.    PAIN:  Are you having pain? No NPRS scale: 0/10    TODAY'S TREATMENT:  10-01-21 O2 monitored throughout all activity: PT train) ing on diaphragmatic breathing throughout session as well.   NuStep x 3 min 2 sets Walking while pushing portable O2 in transport chair x 115 feet, then 110 feet Standing in parallel bars: side stepping 3 laps (length of parallel bars) hip ext, hip abd, heel raises, mini squats 2 x 10 each (Patient needed seated rest break after side stepping and then she is able to take 1-2 min standing or seated rest breaks throughout other standing exercises)     PATIENT EDUCATION: Education details: Diaphragmatic breathing Person educated: Patient and Child(ren) Education method: Customer service manager Education comprehension: verbalized understanding, returned demonstration, and verbal cues required     HOME EXERCISE PROGRAM: Diagphragmatic breathing exercises with walking and recumbant bike 2 times per day.      PT Short Term Goals - 09/29/21 1033                PT SHORT TERM GOAL #1    Title Patient to be independent with initial HEP     Time 4     Period Weeks     Status Achieved     Target Date 09/01/21          PT SHORT TERM GOAL #2    Title Patient to  be able to walk 30 feet and maintain O2 sats at 85% or above with 4 liters O2     Baseline Patient able to do this with 5 liters     Time 4     Period Weeks     Status Achieved     Target Date 09/01/21                     PT Long Term Goals - 09/29/21 1034                PT LONG TERM GOAL #1    Title Independence with advanced HEP     Time 8     Period Weeks     Status Achieved     Target Date 09/29/21          PT LONG TERM GOAL #2    Title Patient to be able to stand erect to cook vs. leaning on  counter x 30 min     Time 12     Period Weeks     Status On-going     Target Date 10/27/21          PT LONG TERM GOAL #3    Title Patient to be able to ambulate 100 feet with continuous O2 maintaining O2 sats at 85% or above.     Time 12     Period Weeks     Status On-going     Target Date 10/27/21          PT LONG TERM GOAL #4    Title Patient to be able to decreased O2 level to 3 liters when at rest.     Time 8     Period Weeks     Status Not Met     Target Date 09/29/21          PT LONG TERM GOAL #5    Title Patient to be able to demonstrate proper diaphragmatic breathing independently     Time 12     Period Weeks     Status On-going     Target Date 10/27/21                   [facility] PT Assessment - 09/29/21 0001                Assessment    Medical Diagnosis Physical deconditioning     Referring Provider (PT) Tennis Must. Praveen Mannam     Onset Date/Surgical Date 11/21/19     Hand Dominance Right     Next MD Visit 6 months     Prior Therapy not for respiratory          Precautions    Precautions Fall          Prior Function    Level of Independence Independent with basic ADLs;Independent with household mobility without device;Independent with transfers     Vocation Retired     Actuary    Overall Cognitive Status Within Functional Limits for tasks assessed          Functional Tests    Functional tests Sit to Stand          Sit to Stand    Comments independent without use of hands from wheelchair          Posture/Postural Control    Posture/Postural Control Postural limitations     Postural  Limitations Rounded Shoulders;Forward head;Increased thoracic kyphosis;Posterior pelvic tilt          AROM    Overall AROM  Within functional limits for tasks performed     Overall AROM Comments WFL bilateral UE and LE          Strength    Overall Strength Within functional limits for tasks performed     Overall Strength Comments Generally  4- to 4/5 bilateral UE and LE          Ambulation/Gait    Ambulation/Gait Yes     Ambulation/Gait Assistance 6: Modified independent (Device/Increase time)     Ambulation Distance (Feet) 130 Feet   130 ft then 125 ft with approx 3 min recovery    Assistive device Rolling walker   pushed wheelchair with O2 tank to allow for increased walking distance.         Standardized Balance Assessment    Standardized Balance Assessment Timed Up and Go Test          Timed Up and Go Test    TUG Normal TUG     Normal TUG (seconds) 16.5                   Plan - 09/29/21 1009       Clinical Impression Statement Breckin was able to walk 15 feet further today x 2.  She is able to work through most standing exercises in parallel bars without seated rest breaks now.  She maintains O2 at mid 80's during exercises and briefly drops down to low 80's before recovering to 90 in approx 2-3 min.  We were able to also add one additional exercise standing.      Personal Factors and Comorbidities Age;Comorbidity 1;Comorbidity 2;Comorbidity 3+     Comorbidities COPD, Htn, Diastolic Heart failure, pulmonary htn     Examination-Activity Limitations Bathing;Dressing;Bend;Lift;Squat;Stairs;Stand;Carry     Examination-Participation Restrictions Laundry;Cleaning;Shop;Community Activity;Meal Prep     Stability/Clinical Decision Making Evolving/Moderate complexity     Clinical Decision Making Moderate     Rehab Potential Fair     PT Frequency 1x / week     PT Duration 4 weeks     PT Treatment/Interventions ADLs/Self Care Home Management;Aquatic Therapy;Canalith Repostioning;Traction;Moist Heat;Iontophoresis 4mg /ml Dexamethasone;Electrical Stimulation;Cryotherapy;Ultrasound;DME Instruction;Gait training;Therapeutic exercise;Therapeutic activities;Functional mobility training;Stair training;Balance training;Neuromuscular re-education;Patient/family education;Manual techniques;Wheelchair mobility training;Passive range of  motion;Vestibular;Vasopneumatic Device;Taping;Energy conservation;Dry needling     PT Next Visit Plan We will continue 1 time per week x 4 weeks for LE strength and pulmonary rehab.     PT Home Exercise Plan continue to practice diaphragmatic breathing and monitor O2 sats with bicycling and walking     Friendly B. Ronie Barnhart, PT 10/01/2308:13 AM

## 2021-10-07 ENCOUNTER — Other Ambulatory Visit: Payer: Self-pay

## 2021-10-07 ENCOUNTER — Ambulatory Visit: Payer: Medicare PPO

## 2021-10-07 DIAGNOSIS — J449 Chronic obstructive pulmonary disease, unspecified: Secondary | ICD-10-CM | POA: Diagnosis not present

## 2021-10-07 DIAGNOSIS — R262 Difficulty in walking, not elsewhere classified: Secondary | ICD-10-CM

## 2021-10-07 DIAGNOSIS — M6281 Muscle weakness (generalized): Secondary | ICD-10-CM

## 2021-10-07 NOTE — Therapy (Signed)
OUTPATIENT PHYSICAL THERAPY TREATMENT NOTE   Patient Name: [name] MRN: [id number] DOB:07/23/1926, 88 y.o., female Today's Date: 10/07/2021  PCP: Antony Contras, MD REFERRING PROVIDER: Marshell Garfinkel, MD   PT End of Session - 10/07/21 1211     Visit Number 17    Date for PT Re-Evaluation 10/27/21    Authorization Type [employer]    Authorization Time Period 09/29/21 - 10/27/21    Authorization - Visit Number [id number]    Authorization - Number of Visits 19    PT Start Time 1150    PT Stop Time 1230    PT Time Calculation (min) 40 min    Equipment Utilized During Treatment Oxygen    Activity Tolerance Patient limited by fatigue    Behavior During Therapy WFL for tasks assessed/performed             Past Medical History:  Diagnosis Date   COPD (chronic obstructive pulmonary disease) ([place])    Hypertension    History reviewed. No pertinent surgical history. Patient Active Problem List   Diagnosis Date Noted   Right epiretinal membrane 11/24/2020   Exudative age-related macular degeneration of left eye with active choroidal neovascularization ([place]) 11/26/2019   Left epiretinal membrane 11/26/2019   Intermediate stage nonexudative age-related macular degeneration of both eyes 11/26/2019   Pseudophakia 11/26/2019   Posterior vitreous detachment of both eyes 11/26/2019   COPD  GOLD I 01/09/2019   Cor pulmonale (chronic) (HCC) 01/09/2019   Chronic respiratory failure with hypoxia and hypercapnia ([place]) 01/09/2019   Postinflammatory pulmonary fibrosis ([place]) 01/09/2019   DOE (dyspnea on exertion) 01/08/2019   COPD exacerbation ([place]) 10/27/2018   Essential hypertension 10/27/2018   Acute respiratory failure ([place]) 10/27/2018   Hypomagnesemia 10/27/2018   Hypokalemia 10/27/2018    REFERRING DIAG: R53.81 (ICD-10-CM) - Physical deconditioning  THERAPY DIAG:  Muscle weakness (generalized)  Difficulty in walking, not elsewhere classified  Chronic obstructive pulmonary  disease, unspecified COPD type ([place])  PERTINENT HISTORY: COPD, CHF, HTN  PRECAUTIONS: Fall  SUBJECTIVE: Patient and dtr report patient having no issues.  Feeling good today.    PAIN:  Are you having pain? No NPRS scale: 0/10   TODAY'S TREATMENT:  10-07-21 O2 monitored throughout all activity: PT training to focus on diaphragmatic breathing during recovery).   Walking while pushing portable O2 in transport chair x 160 feet, then 100 feet Sit to stand x 10 Standing in parallel bars: side stepping 3 laps (length of parallel bars) hip ext, hip abd, heel raises, mini squats 2 x 10 each (Patient needed seated rest break after side stepping and then she is able to take 1-2 min standing or seated rest breaks throughout other standing exercises)  TODAY'S TREATMENT:  10-01-21 O2 monitored throughout all activity: PT train) ing on diaphragmatic breathing throughout session as well.   NuStep x 3 min 2 sets Walking while pushing portable O2 in transport chair x 115 feet, then 110 feet Standing in parallel bars: side stepping 3 laps (length of parallel bars) hip ext, hip abd, heel raises, mini squats 2 x 10 each (Patient needed seated rest break after side stepping and then she is able to take 1-2 min standing or seated rest breaks throughout other standing exercises)     PATIENT EDUCATION: Education details: Diaphragmatic breathing Person educated: Patient and Child(ren) Education method: Customer service manager Education comprehension: verbalized understanding, returned demonstration, and verbal cues required     HOME EXERCISE PROGRAM: Diagphragmatic breathing exercises with walking and recumbant  bike 2 times per day.      PT Short Term Goals - 09/29/21 1033                PT SHORT TERM GOAL #1    Title Patient to be independent with initial HEP     Time 4     Period Weeks     Status Achieved     Target Date 09/01/21          PT SHORT TERM GOAL #2    Title Patient to be  able to walk 30 feet and maintain O2 sats at 85% or above with 4 liters O2     Baseline Patient able to do this with 5 liters     Time 4     Period Weeks     Status Achieved     Target Date 09/01/21                     PT Long Term Goals - 09/29/21 1034                PT LONG TERM GOAL #1    Title Independence with advanced HEP     Time 8     Period Weeks     Status Achieved     Target Date 09/29/21          PT LONG TERM GOAL #2    Title Patient to be able to stand erect to cook vs. leaning on counter x 30 min     Time 12     Period Weeks     Status On-going     Target Date 10/27/21          PT LONG TERM GOAL #3    Title Patient to be able to ambulate 100 feet with continuous O2 maintaining O2 sats at 85% or above.     Time 12     Period Weeks     Status On-going     Target Date 10/27/21          PT LONG TERM GOAL #4    Title Patient to be able to decreased O2 level to 3 liters when at rest.     Time 8     Period Weeks     Status Not Met     Target Date 09/29/21          PT LONG TERM GOAL #5    Title Patient to be able to demonstrate proper diaphragmatic breathing independently     Time 12     Period Weeks     Status On-going     Target Date 10/27/21                   [facility] PT Assessment - 09/29/21 0001                Assessment    Medical Diagnosis Physical deconditioning     Referring Provider (PT) Tennis Must. Praveen Mannam     Onset Date/Surgical Date 11/21/19     Hand Dominance Right     Next MD Visit 6 months     Prior Therapy not for respiratory          Precautions    Precautions Fall          Prior Function    Level of Independence Independent with basic ADLs;Independent with household mobility without device;Independent with transfers     Vocation Retired  Leisure Clinical research associate    Overall Cognitive Status Within Functional Limits for tasks assessed          Functional Tests    Functional tests Sit to Stand           Sit to Stand    Comments independent without use of hands from wheelchair          Posture/Postural Control    Posture/Postural Control Postural limitations     Postural Limitations Rounded Shoulders;Forward head;Increased thoracic kyphosis;Posterior pelvic tilt          AROM    Overall AROM  Within functional limits for tasks performed     Overall AROM Comments WFL bilateral UE and LE          Strength    Overall Strength Within functional limits for tasks performed     Overall Strength Comments Generally 4- to 4/5 bilateral UE and LE          Ambulation/Gait    Ambulation/Gait Yes     Ambulation/Gait Assistance 6: Modified independent (Device/Increase time)     Ambulation Distance (Feet) 130 Feet   130 ft then 125 ft with approx 3 min recovery    Assistive device Rolling walker   pushed wheelchair with O2 tank to allow for increased walking distance.         Standardized Balance Assessment    Standardized Balance Assessment Timed Up and Go Test          Timed Up and Go Test    TUG Normal TUG     Normal TUG (seconds) 16.5                   Plan - 09/29/21 1009       Clinical Impression Statement Brittnay was able to walk 160 feet today.  She was struggling to recover when PT realized dtr had left O2 tank on 4 liters vs. 5 liters and tubing was crimped at attachment.  Once adjusted, patient was able to complete remaining exercises with increased ease.      Personal Factors and Comorbidities Age;Comorbidity 1;Comorbidity 2;Comorbidity 3+     Comorbidities COPD, Htn, Diastolic Heart failure, pulmonary htn     Examination-Activity Limitations Bathing;Dressing;Bend;Lift;Squat;Stairs;Stand;Carry     Examination-Participation Restrictions Laundry;Cleaning;Shop;Community Activity;Meal Prep     Stability/Clinical Decision Making Evolving/Moderate complexity     Clinical Decision Making Moderate     Rehab Potential Fair     PT Frequency 1x / week     PT Duration 4 weeks     PT  Treatment/Interventions ADLs/Self Care Home Management;Aquatic Therapy;Canalith Repostioning;Traction;Moist Heat;Iontophoresis 4mg /ml Dexamethasone;Electrical Stimulation;Cryotherapy;Ultrasound;DME Instruction;Gait training;Therapeutic exercise;Therapeutic activities;Functional mobility training;Stair training;Balance training;Neuromuscular re-education;Patient/family education;Manual techniques;Wheelchair mobility training;Passive range of motion;Vestibular;Vasopneumatic Device;Taping;Energy conservation;Dry needling     PT Next Visit Plan We will continue 1 time per week x 4 weeks for LE strength and pulmonary rehab.     PT Home Exercise Plan continue to practice diaphragmatic breathing and monitor O2 sats with bicycling and walking     [place] B. Emric Kowalewski, PT 02/16/231:14 PM

## 2021-10-13 ENCOUNTER — Ambulatory Visit: Payer: Medicare PPO

## 2021-10-13 ENCOUNTER — Other Ambulatory Visit: Payer: Self-pay

## 2021-10-13 DIAGNOSIS — J449 Chronic obstructive pulmonary disease, unspecified: Secondary | ICD-10-CM

## 2021-10-13 DIAGNOSIS — M6281 Muscle weakness (generalized): Secondary | ICD-10-CM | POA: Diagnosis not present

## 2021-10-13 DIAGNOSIS — R262 Difficulty in walking, not elsewhere classified: Secondary | ICD-10-CM | POA: Diagnosis not present

## 2021-10-13 NOTE — Therapy (Signed)
OUTPATIENT PHYSICAL THERAPY TREATMENT NOTE   Patient Name: [name] MRN: [id number] DOB:1925/12/26, 88 y.o., female Today's Date: 10/13/2021  PCP: Antony Contras, MD REFERRING PROVIDER: Marshell Garfinkel, MD   PT End of Session - 10/13/21 1021     Visit Number 18    Date for PT Re-Evaluation 10/27/21    Authorization Type [employer]    Authorization Time Period 09/29/21 - 10/27/21    Authorization - Visit Number [id number]    Authorization - Number of Visits 19    PT Start Time 1017    PT Stop Time 1100    PT Time Calculation (min) 43 min    Equipment Utilized During Treatment Oxygen    Activity Tolerance Patient limited by fatigue    Behavior During Therapy WFL for tasks assessed/performed             Past Medical History:  Diagnosis Date   COPD (chronic obstructive pulmonary disease) ([place])    Hypertension    History reviewed. No pertinent surgical history. Patient Active Problem List   Diagnosis Date Noted   Right epiretinal membrane 11/24/2020   Exudative age-related macular degeneration of left eye with active choroidal neovascularization ([place]) 11/26/2019   Left epiretinal membrane 11/26/2019   Intermediate stage nonexudative age-related macular degeneration of both eyes 11/26/2019   Pseudophakia 11/26/2019   Posterior vitreous detachment of both eyes 11/26/2019   COPD  GOLD I 01/09/2019   Cor pulmonale (chronic) (HCC) 01/09/2019   Chronic respiratory failure with hypoxia and hypercapnia ([place]) 01/09/2019   Postinflammatory pulmonary fibrosis ([place]) 01/09/2019   DOE (dyspnea on exertion) 01/08/2019   COPD exacerbation ([place]) 10/27/2018   Essential hypertension 10/27/2018   Acute respiratory failure ([place]) 10/27/2018   Hypomagnesemia 10/27/2018   Hypokalemia 10/27/2018    REFERRING DIAG: R53.81 (ICD-10-CM) - Physical deconditioning  THERAPY DIAG:  Muscle weakness (generalized)  Difficulty in walking, not elsewhere classified  Chronic obstructive pulmonary  disease, unspecified COPD type ([place])  PERTINENT HISTORY: COPD, CHF, HTN  PRECAUTIONS: Fall  SUBJECTIVE: Patient denies any pain but states she feels a little off due to allergies.      PAIN:  Are you having pain? No NPRS scale: 0/10  TODAY'S TREATMENT:  10-13-21 O2 monitored throughout all activity: PT training to focus on diaphragmatic breathing during recovery).   Walking while pushing portable O2 in transport chair x 146 feet, then 110 feet Seated toe and heel raises x 20 Seated LAQ x 20 ea LE Seated march X 20  Sit to stand x 10 Standing in parallel bars: side stepping 3 laps (length of parallel bars) hip ext, hip abd, heel raises, mini squats 2 x 10 each (Patient needed seated rest break after side stepping and then she is able to take 1-2 min standing or seated rest breaks throughout other standing exercises)  TODAY'S TREATMENT:  10-07-21 O2 monitored throughout all activity: PT training to focus on diaphragmatic breathing during recovery).   Walking while pushing portable O2 in transport chair x 160 feet, then 100 feet Sit to stand x 10 Standing in parallel bars: side stepping 3 laps (length of parallel bars) hip ext, hip abd, heel raises, mini squats 2 x 10 each (Patient needed seated rest break after side stepping and then she is able to take 1-2 min standing or seated rest breaks throughout other standing exercises)  TODAY'S TREATMENT:  10-01-21 O2 monitored throughout all activity: PT train) ing on diaphragmatic breathing throughout session as well.   NuStep x 3  min 2 sets Walking while pushing portable O2 in transport chair x 115 feet, then 110 feet Standing in parallel bars: side stepping 3 laps (length of parallel bars) hip ext, hip abd, heel raises, mini squats 2 x 10 each (Patient needed seated rest break after side stepping and then she is able to take 1-2 min standing or seated rest breaks throughout other standing exercises)     PATIENT EDUCATION: Education  details: Diaphragmatic breathing Person educated: Patient and Child(ren) Education method: Customer service manager Education comprehension: verbalized understanding, returned demonstration, and verbal cues required     HOME EXERCISE PROGRAM: Diagphragmatic breathing exercises with walking and recumbant bike 2 times per day.      PT Short Term Goals - 09/29/21 1033                PT SHORT TERM GOAL #1    Title Patient to be independent with initial HEP     Time 4     Period Weeks     Status Achieved     Target Date 09/01/21          PT SHORT TERM GOAL #2    Title Patient to be able to walk 30 feet and maintain O2 sats at 85% or above with 4 liters O2     Baseline Patient able to do this with 5 liters     Time 4     Period Weeks     Status Achieved     Target Date 09/01/21                     PT Long Term Goals - 09/29/21 1034                PT LONG TERM GOAL #1    Title Independence with advanced HEP     Time 8     Period Weeks     Status Achieved     Target Date 09/29/21          PT LONG TERM GOAL #2    Title Patient to be able to stand erect to cook vs. leaning on counter x 30 min     Time 12     Period Weeks     Status On-going     Target Date 10/27/21          PT LONG TERM GOAL #3    Title Patient to be able to ambulate 100 feet with continuous O2 maintaining O2 sats at 85% or above.     Time 12     Period Weeks     Status On-going     Target Date 10/27/21          PT LONG TERM GOAL #4    Title Patient to be able to decreased O2 level to 3 liters when at rest.     Time 8     Period Weeks     Status Not Met     Target Date 09/29/21          PT LONG TERM GOAL #5    Title Patient to be able to demonstrate proper diaphragmatic breathing independently     Time 12     Period Weeks     Status On-going     Target Date 10/27/21                   [facility] PT Assessment - 09/29/21 0001  Assessment    Medical Diagnosis Physical  deconditioning     Referring Provider (PT) Tennis Must. Praveen Mannam     Onset Date/Surgical Date 11/21/19     Hand Dominance Right     Next MD Visit 6 months     Prior Therapy not for respiratory          Precautions    Precautions Fall          Prior Function    Level of Independence Independent with basic ADLs;Independent with household mobility without device;Independent with transfers     Vocation Retired     Actuary    Overall Cognitive Status Within Functional Limits for tasks assessed          Functional Tests    Functional tests Sit to Stand          Sit to Stand    Comments independent without use of hands from wheelchair          Posture/Postural Control    Posture/Postural Control Postural limitations     Postural Limitations Rounded Shoulders;Forward head;Increased thoracic kyphosis;Posterior pelvic tilt          AROM    Overall AROM  Within functional limits for tasks performed     Overall AROM Comments WFL bilateral UE and LE          Strength    Overall Strength Within functional limits for tasks performed     Overall Strength Comments Generally 4- to 4/5 bilateral UE and LE          Ambulation/Gait    Ambulation/Gait Yes     Ambulation/Gait Assistance 6: Modified independent (Device/Increase time)     Ambulation Distance (Feet) 130 Feet   130 ft then 125 ft with approx 3 min recovery    Assistive device Rolling walker   pushed wheelchair with O2 tank to allow for increased walking distance.         Standardized Balance Assessment    Standardized Balance Assessment Timed Up and Go Test          Timed Up and Go Test    TUG Normal TUG     Normal TUG (seconds) 16.5                   Plan - 09/29/21 1009       Clinical Impression Statement Janye has been able to walk a little further and complete additional therex in the past few visits with improved recovery time.  She is diligent with her home exercise routine and practicing her  breathing techniques.  She has one visit left.  We will DC at that time.      Personal Factors and Comorbidities Age;Comorbidity 1;Comorbidity 2;Comorbidity 3+     Comorbidities COPD, Htn, Diastolic Heart failure, pulmonary htn     Examination-Activity Limitations Bathing;Dressing;Bend;Lift;Squat;Stairs;Stand;Carry     Examination-Participation Restrictions Laundry;Cleaning;Shop;Community Activity;Meal Prep     Stability/Clinical Decision Making Evolving/Moderate complexity     Clinical Decision Making Moderate     Rehab Potential Fair     PT Frequency 1x / week     PT Duration 4 weeks     PT Treatment/Interventions ADLs/Self Care Home Management;Aquatic Therapy;Canalith Repostioning;Traction;Moist Heat;Iontophoresis 60m/ml Dexamethasone;Electrical Stimulation;Cryotherapy;Ultrasound;DME Instruction;Gait training;Therapeutic exercise;Therapeutic activities;Functional mobility training;Stair training;Balance training;Neuromuscular re-education;Patient/family education;Manual techniques;Wheelchair mobility training;Passive range of motion;Vestibular;Vasopneumatic Device;Taping;Energy conservation;Dry needling     PT Next Visit Plan Plan to DC next visit.  Discuss DC plan and home program.      PT Home Exercise Plan continue to practice diaphragmatic breathing and monitor O2 sats with bicycling and walking     Sherelle Castelli B. Atalia Litzinger, PT 10/13/2309:54 AM

## 2021-10-20 ENCOUNTER — Ambulatory Visit: Payer: Medicare PPO | Attending: Pulmonary Disease

## 2021-10-20 ENCOUNTER — Other Ambulatory Visit: Payer: Self-pay

## 2021-10-20 DIAGNOSIS — M6281 Muscle weakness (generalized): Secondary | ICD-10-CM | POA: Insufficient documentation

## 2021-10-20 DIAGNOSIS — J449 Chronic obstructive pulmonary disease, unspecified: Secondary | ICD-10-CM | POA: Diagnosis not present

## 2021-10-20 DIAGNOSIS — R262 Difficulty in walking, not elsewhere classified: Secondary | ICD-10-CM | POA: Insufficient documentation

## 2021-10-20 NOTE — Therapy (Signed)
OUTPATIENT PHYSICAL THERAPY TREATMENT NOTE   Patient Name: [name] MRN: [id number] DOB:July 15, 1926, 88 y.o., female Today's Date: 10/20/2021  PCP: Antony Contras, MD REFERRING PROVIDER: Marshell Garfinkel, MD   PT End of Session - 10/20/21 1027     Visit Number 19    Date for PT Re-Evaluation 10/27/21    Authorization Type [employer]    Authorization Time Period 09/29/21 - 10/27/21    Authorization - Visit Number 10    Authorization - Number of Visits 19    PT Start Time 1018    PT Stop Time 1100    PT Time Calculation (min) 42 min    Equipment Utilized During Treatment Oxygen    Activity Tolerance Patient limited by fatigue    Behavior During Therapy WFL for tasks assessed/performed             Past Medical History:  Diagnosis Date   COPD (chronic obstructive pulmonary disease) ([place])    Hypertension    History reviewed. No pertinent surgical history. Patient Active Problem List   Diagnosis Date Noted   Right epiretinal membrane 11/24/2020   Exudative age-related macular degeneration of left eye with active choroidal neovascularization ([place]) 11/26/2019   Left epiretinal membrane 11/26/2019   Intermediate stage nonexudative age-related macular degeneration of both eyes 11/26/2019   Pseudophakia 11/26/2019   Posterior vitreous detachment of both eyes 11/26/2019   COPD  GOLD I 01/09/2019   Cor pulmonale (chronic) (HCC) 01/09/2019   Chronic respiratory failure with hypoxia and hypercapnia ([place]) 01/09/2019   Postinflammatory pulmonary fibrosis ([place]) 01/09/2019   DOE (dyspnea on exertion) 01/08/2019   COPD exacerbation ([place]) 10/27/2018   Essential hypertension 10/27/2018   Acute respiratory failure ([place]) 10/27/2018   Hypomagnesemia 10/27/2018   Hypokalemia 10/27/2018    REFERRING DIAG: R53.81 (ICD-10-CM) - Physical deconditioning  THERAPY DIAG:  Muscle weakness (generalized)  Difficulty in walking, not elsewhere classified  Chronic obstructive pulmonary  disease, unspecified COPD type ([place])  PERTINENT HISTORY: COPD, CHF, HTN  PRECAUTIONS: Fall  SUBJECTIVE: Patient denies any pain. She states she is continuing to do her HEP and monitoring her O2 levels as directed.  She is trying to decide on whether to begin at a gym or continue at home.       PAIN:  Are you having pain? No NPRS scale: 0/10  TODAY'S TREATMENT:  10-20-21    O2 monitored throughout all activity: PT training to focus on diaphragmatic breathing during recovery).   Walking while pushing portable O2 in transport chair x 140 feet, then 100 feet Lengthy discussion regarding DC plan Seated LAQ x 20 ea LE Seated march X 20   DC assessment completed  TODAY'S TREATMENT:  10-13-21 O2 monitored throughout all activity: PT training to focus on diaphragmatic breathing during recovery).   Walking while pushing portable O2 in transport chair x 146 feet, then 110 feet Seated toe and heel raises x 20 Seated LAQ x 20 ea LE Seated march X 20  Sit to stand x 10 Standing in parallel bars: side stepping 3 laps (length of parallel bars) hip ext, hip abd, heel raises, mini squats 2 x 10 each (Patient needed seated rest break after side stepping and then she is able to take 1-2 min standing or seated rest breaks throughout other standing exercises)  TODAY'S TREATMENT:  10-07-21 O2 monitored throughout all activity: PT training to focus on diaphragmatic breathing during recovery).   Walking while pushing portable O2 in transport chair x 160 feet, then  100 feet Sit to stand x 10 Standing in parallel bars: side stepping 3 laps (length of parallel bars) hip ext, hip abd, heel raises, mini squats 2 x 10 each (Patient needed seated rest break after side stepping and then she is able to take 1-2 min standing or seated rest breaks throughout other standing exercises)      PATIENT EDUCATION: Education details: Diaphragmatic breathing and DC plan Person educated: Patient and Child(ren) Education  method: Customer service manager Education comprehension: verbalized understanding, returned demonstration, and verbal cues required     HOME EXERCISE PROGRAM: Diagphragmatic breathing exercises with walking and recumbant bike 2 times per day.      PT Short Term Goals - 09/29/21 1033                PT SHORT TERM GOAL #1    Title Patient to be independent with initial HEP     Time 4     Period Weeks     Status Achieved     Target Date 09/01/21          PT SHORT TERM GOAL #2    Title Patient to be able to walk 30 feet and maintain O2 sats at 85% or above with 4 liters O2     Baseline Patient able to do this with 5 liters     Time 4     Period Weeks     Status Achieved     Target Date 09/01/21                     PT Long Term Goals - 09/29/21 1034                PT LONG TERM GOAL #1    Title Independence with advanced HEP     Time 8     Period Weeks     Status Achieved     Target Date 09/29/21          PT LONG TERM GOAL #2    Title Patient to be able to stand erect to cook vs. leaning on counter x 30 min     Time 12     Period Weeks     Status Not met (patient states this is more due to her back than her breathing)    Target Date 10/27/21          PT LONG TERM GOAL #3    Title Patient to be able to ambulate 100 feet with continuous O2 maintaining O2 sats at 85% or above.     Time 12     Period Weeks     Status Met     Target Date 10/27/21          PT LONG TERM GOAL #4    Title Patient to be able to decreased O2 level to 3 liters when at rest.     Time 8     Period Weeks     Status Not Met     Target Date 09/29/21          PT LONG TERM GOAL #5    Title Patient to be able to demonstrate proper diaphragmatic breathing independently     Time 12     Period Weeks     Status Met    Target Date 10/27/21                   [facility] PT Assessment - 09/29/21  0001                Assessment    Medical Diagnosis Physical deconditioning     Referring  Provider (PT) Tennis Must. Praveen Mannam     Onset Date/Surgical Date 11/21/19     Hand Dominance Right     Next MD Visit 6 months     Prior Therapy not for respiratory          Precautions    Precautions Fall          Prior Function    Level of Independence Independent with basic ADLs;Independent with household mobility without device;Independent with transfers     Vocation Retired     Actuary    Overall Cognitive Status Within Functional Limits for tasks assessed          Functional Tests    Functional tests Sit to Stand          Sit to Stand    Comments independent without use of hands from wheelchair          Posture/Postural Control    Posture/Postural Control Postural limitations     Postural Limitations Rounded Shoulders;Forward head;Increased thoracic kyphosis;Posterior pelvic tilt          AROM    Overall AROM  Within functional limits for tasks performed     Overall AROM Comments WFL bilateral UE and LE          Strength    Overall Strength Within functional limits for tasks performed     Overall Strength Comments Generally 4- to 4/5 bilateral UE and LE          Ambulation/Gait    Ambulation/Gait Yes     Ambulation/Gait Assistance 6: Modified independent (Device/Increase time)     Ambulation Distance (Feet) 130 Feet   140 ft then 125 ft with approx 3 min recovery    Assistive device Rolling walker   pushed wheelchair with O2 tank to allow for increased walking distance.         Standardized Balance Assessment    Standardized Balance Assessment Timed Up and Go Test          Timed Up and Go Test    TUG Normal TUG     Normal TUG (seconds) 13.65 (improved from 16.5 previously)                  Plan - 09/29/21 1009       Clinical Impression Statement [name] has been able to walk a little further and complete additional therex in the past few visits with improved recovery time.  She is diligent with her home exercise routine and practicing  her breathing techniques.  She has met max potential due to her pulmonary status.  We will DC at this time.      Personal Factors and Comorbidities Age;Comorbidity 1;Comorbidity 2;Comorbidity 3+     Comorbidities COPD, Htn, Diastolic Heart failure, pulmonary htn     Examination-Activity Limitations Bathing;Dressing;Bend;Lift;Squat;Stairs;Stand;Carry     Examination-Participation Restrictions Laundry;Cleaning;Shop;Community Activity;Meal Prep     Stability/Clinical Decision Making Evolving/Moderate complexity     Clinical Decision Making Moderate     Rehab Potential Fair     PT Frequency 1x / week     PT Duration 4 weeks     PT Treatment/Interventions ADLs/Self Care Home Management;Aquatic Therapy;Canalith Repostioning;Traction;Moist Heat;Iontophoresis 4mg /ml Dexamethasone;Electrical Stimulation;Cryotherapy;Ultrasound;DME Instruction;Gait training;Therapeutic exercise;Therapeutic activities;Functional mobility training;Stair training;Balance training;Neuromuscular  re-education;Patient/family education;Manual techniques;Wheelchair mobility training;Passive range of motion;Vestibular;Vasopneumatic Device;Taping;Energy conservation;Dry needling     PT Next Visit Plan Plan to DC next visit.  Discuss DC plan and home program.      PT Home Exercise Plan continue to practice diaphragmatic breathing and monitor O2 sats with bicycling and walking    PHYSICAL THERAPY DISCHARGE SUMMARY  Visits from Start of Care: 19  Current functional level related to goals / functional outcomes: See above   Remaining deficits: See above   Education / Equipment: See above   Patient agrees to discharge. Patient goals were  mostly met, patient's pulmonary status limits further progress . Patient is being discharged due to being pleased with the current functional level.   Anderson Malta B. Cande Mastropietro, PT 10/20/2309:38 AM

## 2021-11-23 ENCOUNTER — Other Ambulatory Visit: Payer: Self-pay

## 2021-11-23 ENCOUNTER — Encounter (INDEPENDENT_AMBULATORY_CARE_PROVIDER_SITE_OTHER): Payer: Medicare PPO | Admitting: Ophthalmology

## 2021-11-23 ENCOUNTER — Encounter (INDEPENDENT_AMBULATORY_CARE_PROVIDER_SITE_OTHER): Payer: Self-pay | Admitting: Ophthalmology

## 2021-11-23 ENCOUNTER — Ambulatory Visit (INDEPENDENT_AMBULATORY_CARE_PROVIDER_SITE_OTHER): Payer: Medicare PPO | Admitting: Ophthalmology

## 2021-11-23 DIAGNOSIS — H353132 Nonexudative age-related macular degeneration, bilateral, intermediate dry stage: Secondary | ICD-10-CM

## 2021-11-23 DIAGNOSIS — H353221 Exudative age-related macular degeneration, left eye, with active choroidal neovascularization: Secondary | ICD-10-CM

## 2021-11-23 MED ORDER — BEVACIZUMAB 2.5 MG/0.1ML IZ SOSY
2.5000 mg | PREFILLED_SYRINGE | INTRAVITREAL | Status: AC | PRN
  Administered 2021-11-23: 2.5 mg via INTRAVITREAL

## 2021-11-23 NOTE — Assessment & Plan Note (Signed)
OS with chronic intraretinal fluid today at 8-week follow-up again.  We will repeat injection Avastin today and will have to shorten interval follow-up next to 6 weeks left eye ?

## 2021-11-23 NOTE — Progress Notes (Signed)
? ? ?11/23/2021 ? ?  ? ?CHIEF COMPLAINT ?Patient presents for  ?Chief Complaint  ?Patient presents with  ? Macular Degeneration  ? ? ? ? ?HISTORY OF PRESENT ILLNESS: ?[name] is a 88 y.o. female who presents to the clinic today for:  ? ?HPI   ?8 weeks for dilate ou, avastin oct, os. ?Pt states no changes in vision  ?Pt denies floaters and FOL. ? ?Last edited by Angeline Slim on 11/23/2021 10:46 AM.  ?  ? ? ?Referring physician: ?Tally Joe, MD ?[phone] ?[address] ?[place],  [place] [address] ? ?HISTORICAL INFORMATION:  ? ?Selected notes from the MEDICAL RECORD NUMBER ?  ?   ? ?CURRENT MEDICATIONS: ?No current outpatient medications on file. (Ophthalmic Drugs)  ? ?No current facility-administered medications for this visit. (Ophthalmic Drugs)  ? ?Current Outpatient Medications (Other)  ?Medication Sig  ? cetirizine (ZYRTEC) 5 MG chewable tablet Chew 5 mg by mouth as needed for allergies.  ? diltiazem (CARDIZEM CD) 360 MG 24 hr capsule Take 360 mg by mouth daily.  ? furosemide (LASIX) 40 MG tablet OKAY TO TAKE EVERY OTHER DAY- RESUME TAKING DAILY IF YOU HAVE WORSENING SOB, LEG SWELLING, OR WEIGHT GAIN  ? metoprolol tartrate (LOPRESSOR) 25 MG tablet Take 12.5 mg by mouth daily.  ? Multiple Vitamins-Minerals (PRESERVISION AREDS 2) CAPS Take 1 capsule by mouth 2 (two) times daily.   ? OXYGEN 4 lpm with sleep and exercise ?Adapt  ? spironolactone (ALDACTONE) 25 MG tablet Take 0.5 tablets (12.5 mg total) by mouth daily.  ? ?No current facility-administered medications for this visit. (Other)  ? ? ? ? ?REVIEW OF SYSTEMS: ?ROS   ?Negative for: Constitutional, Gastrointestinal, Neurological, Skin, Genitourinary, Musculoskeletal, HENT, Endocrine, Cardiovascular, Eyes, Respiratory, Psychiatric, Allergic/Imm, Heme/Lymph ?Last edited by Angeline Slim on 11/23/2021 10:46 AM.  ?  ? ? ? ?ALLERGIES ?Allergies  ?Allergen Reactions  ? Bisoprolol-Hydrochlorothiazide Other (See Comments)  ?  Other reaction(s): Cough (ALLERGY/intolerance)  ?  Meperidine Nausea And Vomiting  ? Tetracycline Hives  ? ? ?PAST MEDICAL HISTORY ?Past Medical History:  ?Diagnosis Date  ? COPD (chronic obstructive pulmonary disease) (HCC)   ? Hypertension   ? ?No past surgical history on file. ? ?FAMILY HISTORY ?No family history on file. ? ?SOCIAL HISTORY ?Social History  ? ?Tobacco Use  ? Smoking status: Former  ?  Packs/day: 0.75  ?  Years: 17.00  ?  Pack years: 12.75  ?  Types: Cigarettes  ?  Quit date: 08/22/1966  ?  Years since quitting: 55.2  ? Smokeless tobacco: Former  ?Vaping Use  ? Vaping Use: Never used  ?Substance Use Topics  ? Alcohol use: Not Currently  ? Drug use: Not Currently  ? ?  ? ?  ? ?OPHTHALMIC EXAM: ? ?Base Eye Exam   ? ? Visual Acuity (ETDRS)   ? ?   Right Left  ? Dist cc 20/30 -2 20/80 -2  ? ? Correction: Glasses  ? ?  ?  ? ? Tonometry (Tonopen, 10:52 AM)   ? ?   Right Left  ? Pressure 12 16  ? ?  ?  ? ? Pupils   ? ?   Pupils APD  ? Right PERRL None  ? Left PERRL None  ? ?  ?  ? ? Visual Fields   ? ?   Left Right  ?  Full Full  ? ?  ?  ? ? Extraocular Movement   ? ?  Right Left  ?  Full Full  ? ?  ?  ? ? Neuro/Psych   ? ? Oriented x3: Yes  ? Mood/Affect: Normal  ? ?  ?  ? ? Dilation   ? ? Both eyes: 1.0% Mydriacyl, 2.5% Phenylephrine @ 10:52 AM  ? ?  ?  ? ?  ? ?Slit Lamp and Fundus Exam   ? ? External Exam   ? ?   Right Left  ? External Normal Normal  ? ?  ?  ? ? Slit Lamp Exam   ? ?   Right Left  ? Lids/Lashes Normal Normal  ? Conjunctiva/Sclera White and quiet White and quiet  ? Cornea Clear Clear  ? Anterior Chamber Deep and quiet Deep and quiet  ? Iris Round and reactive Round and reactive  ? Lens Posterior chamber intraocular lens Posterior chamber intraocular lens  ? Anterior Vitreous Normal Central vitreous floaters  ? ?  ?  ? ? Fundus Exam   ? ?   Right Left  ? Posterior Vitreous Posterior vitreous detachment, Central vitreous floaters Vitreous membranes, strands, Posterior vitreous detachment, Central vitreous floaters  ? Disc Normal Normal,,  ?  C/D Ratio 0.45 0.65  ? Macula Retinal pigment epithelial detachment, Soft drusen, Pigmented atrophy, Epiretinal membrane, no topographic distortion Age related macular degeneration, Early age related macular degeneration, Macular thickening, Retinal pigment epithelial detachment, Membrane, Epiretinal membrane without topographic distortion, Soft drusen, Geographic atrophy  ? Vessels Normal Normal  ? Periphery Normal Normal  ? ?  ?  ? ?  ? ? ?IMAGING AND PROCEDURES  ?Imaging and Procedures for 11/23/21 ? ?OCT, Retina - OU - Both Eyes   ? ?   ?Right Eye ?Quality was good. Scan locations included subfoveal. Central Foveal Thickness: 349. Progression has been stable. Findings include pigment epithelial detachment, retinal drusen , abnormal foveal contour, no IRF, no SRF, epiretinal membrane, subretinal hyper-reflective material.  ? ?Left Eye ?Quality was good. Scan locations included subfoveal. Central Foveal Thickness: 313. Progression has improved. Findings include intraretinal fluid, abnormal foveal contour, no SRF, subretinal hyper-reflective material, epiretinal membrane.  ? ?Notes ?Large drusenoid pigment epithelial detachment subfoveal OD, no signs of CNVM, slightly increased in thickness, as patient continues with ongoing lung disease sequela post COVID and on chronic oxygen use ? ?, OS with epiretinal membrane yet with intraretinal fluid nasal to the fovea and improved CME at current 8-week interval examination post injection Avastin OS.  Repeat injection OS today and examination again in 6  weeks, as much improved as compared to April examination ? ? ? ?  ? ?Intravitreal Injection, Pharmacologic Agent - OS - Left Eye   ? ?   ?Time Out ?11/23/2021. 11:46 AM. Confirmed correct patient, procedure, site, and patient consented.  ? ?Anesthesia ?Topical anesthesia was used. Anesthetic medications included Lidocaine 4%.  ? ?Procedure ?Preparation included 10% betadine to eyelids, 5% betadine to ocular surface, Ofloxacin  . A 30 gauge needle was used.  ? ?Injection: ?2.5 mg bevacizumab 2.5 MG/0.1ML ?  Route: Intravitreal, Site: Left Eye ?  Taylor: [id number], [id number]  ? ?Post-op ?Post injection exam found visual acuity of at least counting fingers. The patient tolerated the procedure well. There were no complications. The patient received written and verbal post procedure care education. Post injection medications included ocuflox.  ? ?  ? ? ?  ?  ? ?  ?ASSESSMENT/PLAN: ? ?Exudative age-related macular degeneration of left eye with active choroidal neovascularization ([place]) ?OS with chronic  intraretinal fluid today at 8-week follow-up again.  We will repeat injection Avastin today and will have to shorten interval follow-up next to 6 weeks left eye ? 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 ?  ICD-10-CM   ?1. Exudative age-related macular degeneration of left eye with active choroidal neovascularization (HCC)  H35.3221 OCT, Retina - OU - Both Eyes  ?  Intravitreal Injection, Pharmacologic Agent - OS - Left Eye  ?  bevacizumab (AVASTIN) SOSY 2.5 mg  ?  ?2. Intermediate stage nonexudative age-related macular degeneration of both eyes  H35.3132   ?  ? ? ?1.  OD continue to monitor the macular region ? ?2.  OS with intraretinal fluid persistent and slightly worse at 8-week interval post Avastin.  We will repeat injection today and maintain careful follow-up but shorten interval to 6 weeks ? ?3. ? ?Ophthalmic Meds Ordered this visit:  ?Meds ordered this encounter  ?Medications  ? bevacizumab (AVASTIN) SOSY 2.5 mg  ? ? ?  ? ?Return in about 6 weeks (around 01/04/2022) for dilate, OS, AVASTIN OCT. ? ?There are no Patient Instructions on file for this  visit. ? ? ?Explained the diagnoses, plan, and follow up with the patient and they expressed understanding.  Patient expressed understanding of the importance of proper follow up care.  ? ?Clent Demark. Rank

## 2021-11-23 NOTE — Assessment & Plan Note (Signed)
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

## 2021-12-02 ENCOUNTER — Telehealth: Payer: Self-pay | Admitting: Internal Medicine

## 2021-12-02 NOTE — Telephone Encounter (Signed)
Returned call to daughter she states that pt has a video visit next week and she wanted to let the doctor know about a few things before. She states that pt is having a decline in her ADL's, mobility and energy level. She is grey in pallor. She does not get any exercise. She now also uses O2 24/7 at 4-5 liters. She also sees a pulmonologist as well. She takes her O2 with her to "go out of the house"  she had a inogen compressor but cannot use it due to the O2 level and needs the portable O2 instead. She does have edema and wears compression stockings but, this is nothing new she does not take lasix. She does have 3 daughters and one of them there with her at all times of the day. She has balance issues, she sleeps most of the time and she used to be "a little spit fire" and she is not any longer. Her eyesight is getting worse and they are wondering if this is from the increase O2 use. I have changed her appt from Telephone visit to office appt, daughter agrees with this and will have pt ready and will arrive early. Daughter and sisters are very worried about this decline. Daughter will log BP and HR and bring to appt for review. Do you want to do anything before appt next week? ?

## 2021-12-02 NOTE — Telephone Encounter (Signed)
Pt's daughter has some concerns that she wants to bring to Dr. Lupe Carney attention prior to pt's appt on 12/10/21. Please advise ?

## 2021-12-04 ENCOUNTER — Inpatient Hospital Stay (HOSPITAL_COMMUNITY)
Admission: EM | Admit: 2021-12-04 | Discharge: 2021-12-20 | DRG: 291 | Disposition: E | Payer: Medicare PPO | Attending: Internal Medicine | Admitting: Internal Medicine

## 2021-12-04 ENCOUNTER — Emergency Department (HOSPITAL_COMMUNITY): Payer: Medicare PPO

## 2021-12-04 ENCOUNTER — Encounter (HOSPITAL_COMMUNITY): Payer: Self-pay

## 2021-12-04 DIAGNOSIS — J9621 Acute and chronic respiratory failure with hypoxia: Secondary | ICD-10-CM | POA: Diagnosis present

## 2021-12-04 DIAGNOSIS — I2781 Cor pulmonale (chronic): Secondary | ICD-10-CM | POA: Diagnosis present

## 2021-12-04 DIAGNOSIS — I517 Cardiomegaly: Secondary | ICD-10-CM | POA: Diagnosis not present

## 2021-12-04 DIAGNOSIS — I5031 Acute diastolic (congestive) heart failure: Secondary | ICD-10-CM | POA: Diagnosis not present

## 2021-12-04 DIAGNOSIS — I5033 Acute on chronic diastolic (congestive) heart failure: Secondary | ICD-10-CM | POA: Diagnosis present

## 2021-12-04 DIAGNOSIS — I2729 Other secondary pulmonary hypertension: Secondary | ICD-10-CM | POA: Diagnosis present

## 2021-12-04 DIAGNOSIS — Z6832 Body mass index (BMI) 32.0-32.9, adult: Secondary | ICD-10-CM | POA: Diagnosis not present

## 2021-12-04 DIAGNOSIS — J9811 Atelectasis: Secondary | ICD-10-CM | POA: Diagnosis present

## 2021-12-04 DIAGNOSIS — N1832 Chronic kidney disease, stage 3b: Secondary | ICD-10-CM | POA: Diagnosis present

## 2021-12-04 DIAGNOSIS — N179 Acute kidney failure, unspecified: Secondary | ICD-10-CM | POA: Diagnosis not present

## 2021-12-04 DIAGNOSIS — Z20822 Contact with and (suspected) exposure to covid-19: Secondary | ICD-10-CM | POA: Diagnosis present

## 2021-12-04 DIAGNOSIS — I248 Other forms of acute ischemic heart disease: Secondary | ICD-10-CM | POA: Diagnosis present

## 2021-12-04 DIAGNOSIS — R0600 Dyspnea, unspecified: Secondary | ICD-10-CM | POA: Diagnosis not present

## 2021-12-04 DIAGNOSIS — J449 Chronic obstructive pulmonary disease, unspecified: Secondary | ICD-10-CM | POA: Diagnosis present

## 2021-12-04 DIAGNOSIS — Z66 Do not resuscitate: Secondary | ICD-10-CM | POA: Diagnosis present

## 2021-12-04 DIAGNOSIS — E669 Obesity, unspecified: Secondary | ICD-10-CM | POA: Diagnosis present

## 2021-12-04 DIAGNOSIS — L89321 Pressure ulcer of left buttock, stage 1: Secondary | ICD-10-CM | POA: Diagnosis present

## 2021-12-04 DIAGNOSIS — Z515 Encounter for palliative care: Secondary | ICD-10-CM | POA: Diagnosis not present

## 2021-12-04 DIAGNOSIS — Z79899 Other long term (current) drug therapy: Secondary | ICD-10-CM

## 2021-12-04 DIAGNOSIS — Z888 Allergy status to other drugs, medicaments and biological substances status: Secondary | ICD-10-CM

## 2021-12-04 DIAGNOSIS — Z8616 Personal history of COVID-19: Secondary | ICD-10-CM | POA: Diagnosis not present

## 2021-12-04 DIAGNOSIS — R0602 Shortness of breath: Secondary | ICD-10-CM | POA: Diagnosis not present

## 2021-12-04 DIAGNOSIS — I130 Hypertensive heart and chronic kidney disease with heart failure and stage 1 through stage 4 chronic kidney disease, or unspecified chronic kidney disease: Principal | ICD-10-CM | POA: Diagnosis present

## 2021-12-04 DIAGNOSIS — J810 Acute pulmonary edema: Secondary | ICD-10-CM

## 2021-12-04 DIAGNOSIS — E1165 Type 2 diabetes mellitus with hyperglycemia: Secondary | ICD-10-CM | POA: Diagnosis present

## 2021-12-04 DIAGNOSIS — Z881 Allergy status to other antibiotic agents status: Secondary | ICD-10-CM

## 2021-12-04 DIAGNOSIS — I4891 Unspecified atrial fibrillation: Secondary | ICD-10-CM | POA: Diagnosis present

## 2021-12-04 DIAGNOSIS — J90 Pleural effusion, not elsewhere classified: Secondary | ICD-10-CM | POA: Diagnosis not present

## 2021-12-04 DIAGNOSIS — Z87891 Personal history of nicotine dependence: Secondary | ICD-10-CM | POA: Diagnosis not present

## 2021-12-04 DIAGNOSIS — E1122 Type 2 diabetes mellitus with diabetic chronic kidney disease: Secondary | ICD-10-CM | POA: Diagnosis present

## 2021-12-04 DIAGNOSIS — I5082 Biventricular heart failure: Secondary | ICD-10-CM | POA: Diagnosis present

## 2021-12-04 DIAGNOSIS — Z7189 Other specified counseling: Secondary | ICD-10-CM | POA: Diagnosis not present

## 2021-12-04 DIAGNOSIS — R230 Cyanosis: Secondary | ICD-10-CM | POA: Diagnosis not present

## 2021-12-04 DIAGNOSIS — J8410 Pulmonary fibrosis, unspecified: Secondary | ICD-10-CM | POA: Diagnosis present

## 2021-12-04 DIAGNOSIS — L89311 Pressure ulcer of right buttock, stage 1: Secondary | ICD-10-CM | POA: Diagnosis present

## 2021-12-04 DIAGNOSIS — L8990 Pressure ulcer of unspecified site, unspecified stage: Secondary | ICD-10-CM | POA: Insufficient documentation

## 2021-12-04 DIAGNOSIS — I10 Essential (primary) hypertension: Secondary | ICD-10-CM | POA: Diagnosis present

## 2021-12-04 DIAGNOSIS — Z9981 Dependence on supplemental oxygen: Secondary | ICD-10-CM

## 2021-12-04 HISTORY — DX: Type 2 diabetes mellitus without complications: E11.9

## 2021-12-04 HISTORY — DX: Heart failure, unspecified: I50.9

## 2021-12-04 LAB — I-STAT VENOUS BLOOD GAS, ED
Acid-Base Excess: 2 mmol/L (ref 0.0–2.0)
Bicarbonate: 28.6 mmol/L — ABNORMAL HIGH (ref 20.0–28.0)
Calcium, Ion: 1.14 mmol/L — ABNORMAL LOW (ref 1.15–1.40)
HCT: 54 % — ABNORMAL HIGH (ref 36.0–46.0)
Hemoglobin: 18.4 g/dL — ABNORMAL HIGH (ref 12.0–15.0)
O2 Saturation: 95 %
Potassium: 4.5 mmol/L (ref 3.5–5.1)
Sodium: 138 mmol/L (ref 135–145)
TCO2: 30 mmol/L (ref 22–32)
pCO2, Ven: 51 mmHg (ref 44–60)
pH, Ven: 7.357 (ref 7.25–7.43)
pO2, Ven: 79 mmHg — ABNORMAL HIGH (ref 32–45)

## 2021-12-04 LAB — CBC WITH DIFFERENTIAL/PLATELET
Abs Immature Granulocytes: 0.02 10*3/uL (ref 0.00–0.07)
Basophils Absolute: 0.1 10*3/uL (ref 0.0–0.1)
Basophils Relative: 1 %
Eosinophils Absolute: 0.1 10*3/uL (ref 0.0–0.5)
Eosinophils Relative: 1 %
HCT: 52.5 % — ABNORMAL HIGH (ref 36.0–46.0)
Hemoglobin: 17.5 g/dL — ABNORMAL HIGH (ref 12.0–15.0)
Immature Granulocytes: 0 %
Lymphocytes Relative: 8 %
Lymphs Abs: 0.6 10*3/uL — ABNORMAL LOW (ref 0.7–4.0)
MCH: 32.2 pg (ref 26.0–34.0)
MCHC: 33.3 g/dL (ref 30.0–36.0)
MCV: 96.7 fL (ref 80.0–100.0)
Monocytes Absolute: 0.7 10*3/uL (ref 0.1–1.0)
Monocytes Relative: 10 %
Neutro Abs: 5.6 10*3/uL (ref 1.7–7.7)
Neutrophils Relative %: 80 %
Platelets: 182 10*3/uL (ref 150–400)
RBC: 5.43 MIL/uL — ABNORMAL HIGH (ref 3.87–5.11)
RDW: 14.9 % (ref 11.5–15.5)
WBC: 7 10*3/uL (ref 4.0–10.5)
nRBC: 0 % (ref 0.0–0.2)

## 2021-12-04 LAB — BASIC METABOLIC PANEL
Anion gap: 10 (ref 5–15)
BUN: 27 mg/dL — ABNORMAL HIGH (ref 8–23)
CO2: 25 mmol/L (ref 22–32)
Calcium: 9.4 mg/dL (ref 8.9–10.3)
Chloride: 101 mmol/L (ref 98–111)
Creatinine, Ser: 1.39 mg/dL — ABNORMAL HIGH (ref 0.44–1.00)
GFR, Estimated: 35 mL/min — ABNORMAL LOW (ref >60)
Glucose, Bld: 179 mg/dL — ABNORMAL HIGH (ref 70–99)
Potassium: 4.5 mmol/L (ref 3.5–5.1)
Sodium: 136 mmol/L (ref 135–145)

## 2021-12-04 LAB — TROPONIN I (HIGH SENSITIVITY): Troponin I (High Sensitivity): 68 ng/L — ABNORMAL HIGH (ref <18)

## 2021-12-04 LAB — BRAIN NATRIURETIC PEPTIDE: B Natriuretic Peptide: 848.8 pg/mL — ABNORMAL HIGH (ref 0.0–100.0)

## 2021-12-04 MED ORDER — METHYLPREDNISOLONE SODIUM SUCC 125 MG IJ SOLR
125.0000 mg | Freq: Once | INTRAMUSCULAR | Status: AC
  Administered 2021-12-04: 125 mg via INTRAVENOUS
  Filled 2021-12-04: qty 2

## 2021-12-04 MED ORDER — FUROSEMIDE 10 MG/ML IJ SOLN
60.0000 mg | Freq: Once | INTRAMUSCULAR | Status: AC
  Administered 2021-12-05: 60 mg via INTRAVENOUS
  Filled 2021-12-04: qty 6

## 2021-12-04 MED ORDER — IPRATROPIUM-ALBUTEROL 0.5-2.5 (3) MG/3ML IN SOLN
3.0000 mL | Freq: Once | RESPIRATORY_TRACT | Status: AC
  Administered 2021-12-04: 3 mL via RESPIRATORY_TRACT
  Filled 2021-12-04: qty 3

## 2021-12-04 MED ORDER — IPRATROPIUM-ALBUTEROL 0.5-2.5 (3) MG/3ML IN SOLN
RESPIRATORY_TRACT | Status: AC
  Administered 2021-12-04: 3 mL via RESPIRATORY_TRACT
  Filled 2021-12-04: qty 3

## 2021-12-04 MED ORDER — MAGNESIUM SULFATE 2 GM/50ML IV SOLN
2.0000 g | Freq: Once | INTRAVENOUS | Status: AC
  Administered 2021-12-04: 2 g via INTRAVENOUS
  Filled 2021-12-04: qty 50

## 2021-12-04 NOTE — ED Notes (Signed)
RT at bedside.

## 2021-12-04 NOTE — ED Provider Notes (Signed)
?[facility] ?Provider Note ? ? ?CSN: [id number] ?Arrival date & time: 11/28/2021  2206 ? ?  ? ?History ? ?Chief Complaint  ?Patient presents with  ? Shortness of Breath  ? ? ?[name] is a 88 y.o. female. ? ?[name] is a 88 y.o. female with history of CHF, COPD and pulmonary fibrosis, on chronic 5 L nasal cannula, also with a history of hypertension and A-fib, who presents to the emergency department via EMS for evaluation of shortness of breath.  When EMS arrived patient was noted to be satting at 78% on her chronic 5 L nasal cannula and had some increased work of breathing.  Patient was placed on nonrebreather and O2 sats improved to 98%.  She was initially able to be transitioned back down to 6 L nasal cannula but then started to drop sats into the 80s again.  Patient does report feeling better with additional oxygen but continues to have some shortness of breath.  She denies any chest pain.  She reports that she has a history of chronic lower extremity edema but does report its been worse in the past few days.  Daughter later arrived at bedside and reports that her breathing seemed to rapidly decline today and that she has noticed worsening edema over the past week, reports compliance with medications.  Received 2 nitro for elevated blood pressure with EMS but no other meds given prior to arrival.  Patient denies fevers, reports chronic occasional cough. ? ?The history is provided by the patient, the EMS personnel and a relative.  ? ?  ? ?Home Medications ?Prior to Admission medications   ?Medication Sig Start Date End Date Taking? Authorizing Provider  ?cetirizine (ZYRTEC) 5 MG chewable tablet Chew 5 mg by mouth as needed for allergies.    [provider]  ?diltiazem (CARDIZEM CD) 360 MG 24 hr capsule Take 360 mg by mouth daily.    [provider]  ?furosemide (LASIX) 40 MG tablet OKAY TO TAKE EVERY OTHER DAY- RESUME TAKING DAILY IF YOU HAVE  WORSENING SOB, LEG SWELLING, OR WEIGHT GAIN 03/23/20   Elouise Munroe, MD  ?metoprolol tartrate (LOPRESSOR) 25 MG tablet Take 12.5 mg by mouth daily.    [provider]  ?Multiple Vitamins-Minerals (PRESERVISION AREDS 2) CAPS Take 1 capsule by mouth 2 (two) times daily.     [provider]  ?OXYGEN 4 lpm with sleep and exercise ?Adapt    [provider]  ?spironolactone (ALDACTONE) 25 MG tablet Take 0.5 tablets (12.5 mg total) by mouth daily. 04/22/19   Elouise Munroe, MD  ?   ? ?Allergies    ?Bisoprolol-hydrochlorothiazide, Meperidine, and Tetracycline   ? ?Review of Systems   ?Review of Systems  ?Constitutional:  Negative for chills and fever.  ?HENT: Negative.    ?Respiratory:  Positive for cough and shortness of breath.   ?Cardiovascular:  Positive for leg swelling. Negative for chest pain.  ?Gastrointestinal:  Negative for abdominal pain, nausea and vomiting.  ?Genitourinary:  Negative for dysuria and frequency.  ?Musculoskeletal:  Negative for arthralgias and myalgias.  ?Skin:  Negative for color change and rash.  ?Neurological:  Positive for weakness (Generalized). Negative for dizziness, syncope and light-headedness.  ? ?Physical Exam ?Updated Vital Signs ?BP 133/76   Pulse 81   Resp (!) 26   SpO2 97%  ?Physical Exam ?Vitals and nursing note reviewed.  ?Constitutional:   ?   General: She is in acute distress.  ?  Appearance: Normal appearance. She is well-developed. She is ill-appearing. She is not diaphoretic.  ?   Comments: Elderly female, alert, chronically ill-appearing and in some respiratory distress  ?HENT:  ?   Head: Normocephalic and atraumatic.  ?   Mouth/Throat:  ?   Mouth: Mucous membranes are moist.  ?   Pharynx: Oropharynx is clear.  ?Eyes:  ?   General:     ?   Right eye: No discharge.     ?   Left eye: No discharge.  ?   Pupils: Pupils are equal, round, and reactive to light.  ?Cardiovascular:  ?   Rate and Rhythm: Normal rate. Rhythm irregular.  ?    Pulses: Normal pulses.  ?   Heart sounds: Normal heart sounds.  ?Pulmonary:  ?   Effort: Accessory muscle usage present.  ?   Breath sounds: Decreased breath sounds present. No wheezing or rales.  ?   Comments: On my evaluation patient initially on 6 L nasal cannula but sats maintaining in the mid 80s, placed on nonrebreather and Sats improved to 92%.  Patient without tachypnea but with significantly increased respiratory effort with accessory muscle use, breath sounds very diminished bilaterally with minimal air movement, no appreciable wheezing or rales. ?Chest:  ?   Chest wall: No tenderness.  ?Abdominal:  ?   General: Bowel sounds are normal. There is no distension.  ?   Palpations: Abdomen is soft. There is no mass.  ?   Tenderness: There is no abdominal tenderness. There is no guarding.  ?   Comments: Abdomen soft, nondistended, nontender to palpation in all quadrants without guarding or peritoneal signs  ?Musculoskeletal:     ?   General: No deformity.  ?   Cervical back: Neck supple.  ?   Comments: 4+ pitting edema of bilateral lower extremities, as well as some mild edema of the upper extremities.  ?Skin: ?   General: Skin is warm and dry.  ?   Capillary Refill: Capillary refill takes less than 2 seconds.  ?Neurological:  ?   Mental Status: She is alert and oriented to person, place, and time.  ?   Coordination: Coordination normal.  ?   Comments: Speech is clear, able to follow commands ?Moves extremities without ataxia, coordination intact  ?Psychiatric:     ?   Mood and Affect: Mood normal.     ?   Behavior: Behavior normal.  ? ? ?ED Results / Procedures / Treatments   ?Labs ?(all labs ordered are listed, but only abnormal results are displayed) ?Labs Reviewed  ?BASIC METABOLIC PANEL - Abnormal; Notable for the following components:  ?    Result Value  ? Glucose, Bld 179 (*)   ? BUN 27 (*)   ? Creatinine, Ser 1.39 (*)   ? GFR, Estimated 35 (*)   ? All other components within normal limits  ?BRAIN  NATRIURETIC PEPTIDE - Abnormal; Notable for the following components:  ? B Natriuretic Peptide 848.8 (*)   ? All other components within normal limits  ?CBC WITH DIFFERENTIAL/PLATELET - Abnormal; Notable for the following components:  ? RBC 5.43 (*)   ? Hemoglobin 17.5 (*)   ? HCT 52.5 (*)   ? Lymphs Abs 0.6 (*)   ? All other components within normal limits  ?I-STAT VENOUS BLOOD GAS, ED - Abnormal; Notable for the following components:  ? pO2, Ven 79 (*)   ? Bicarbonate 28.6 (*)   ? Calcium, Ion 1.14 (*)   ?  HCT 54.0 (*)   ? Hemoglobin 18.4 (*)   ? All other components within normal limits  ?TROPONIN I (HIGH SENSITIVITY) - Abnormal; Notable for the following components:  ? Troponin I (High Sensitivity) 68 (*)   ? All other components within normal limits  ?RESP PANEL BY RT-PCR (FLU A&B, COVID) [id number]  ?TROPONIN I (HIGH SENSITIVITY)  ? ? ?EKG ?EKG Interpretation ? ?Date/Time:  Saturday December 04 2021 22:25:37 EDT ?Ventricular Rate:  80 ?PR Interval:    ?QRS Duration: 88 ?QT Interval:  472 ?QTC Calculation: 538 ?R Axis:   151 ?Text Interpretation: Atrial fibrillation Probable lateral infarct, age indeterminate Anteroseptal infarct, age indeterminate Prolonged QT interval No significant change since last tracing Confirmed by Blanchie Dessert [phone]) on 12/05/2021 12:04:57 AM ? ?Radiology ?DG Chest Port 1 View ? ?Result Date: 12/06/2021 ?CLINICAL DATA:  Short of breath EXAM: PORTABLE CHEST 1 VIEW COMPARISON:  05/19/2021 FINDINGS: Single frontal view of the chest demonstrates a stable cardiac silhouette. There is continued bibasilar scarring and fibrosis, with superimposed ground-glass airspace disease and small effusions. No pneumothorax. IMPRESSION: 1. Findings consistent with pulmonary edema superimposed upon chronic basilar scarring and fibrosis. 2. Small bilateral pleural effusions. Electronically Signed   By: Randa Ngo M.D.   On: 11/21/2021 22:42   ? ?Procedures ?Marland KitchenCritical Care ?Performed by: Jacqlyn Larsen,  PA-C ?Authorized by: Jacqlyn Larsen, PA-C  ? ?Critical care provider statement:  ?  Critical care time (minutes):  30 ?  Critical care was time spent personally by me on the following activities:  Development of treatment plan

## 2021-12-04 NOTE — ED Notes (Signed)
Called RT requesting bipap per PA request ?

## 2021-12-04 NOTE — ED Triage Notes (Signed)
Pt arrived via EMS w/ c/o SOB, per EMS pt was 78% on 5L [place] but "had 50 feet of oxygen extension tubing" EMS placed pt on NRB pt came up to 98%,Pt has hx of COPD, switched pt to 6L [place] during triage pt SPO2 at 92% ?Pt stated she "feels much better now" ?Pt was also given 2 of nitro en route for elevated BP ?

## 2021-12-05 ENCOUNTER — Encounter (HOSPITAL_COMMUNITY): Payer: Self-pay | Admitting: Internal Medicine

## 2021-12-05 ENCOUNTER — Other Ambulatory Visit: Payer: Self-pay

## 2021-12-05 DIAGNOSIS — J8410 Pulmonary fibrosis, unspecified: Secondary | ICD-10-CM | POA: Diagnosis present

## 2021-12-05 DIAGNOSIS — I248 Other forms of acute ischemic heart disease: Secondary | ICD-10-CM | POA: Diagnosis present

## 2021-12-05 DIAGNOSIS — I5033 Acute on chronic diastolic (congestive) heart failure: Secondary | ICD-10-CM

## 2021-12-05 DIAGNOSIS — Z8616 Personal history of COVID-19: Secondary | ICD-10-CM | POA: Diagnosis not present

## 2021-12-05 DIAGNOSIS — L89321 Pressure ulcer of left buttock, stage 1: Secondary | ICD-10-CM | POA: Diagnosis present

## 2021-12-05 DIAGNOSIS — I5082 Biventricular heart failure: Secondary | ICD-10-CM | POA: Diagnosis present

## 2021-12-05 DIAGNOSIS — I2729 Other secondary pulmonary hypertension: Secondary | ICD-10-CM | POA: Diagnosis present

## 2021-12-05 DIAGNOSIS — E669 Obesity, unspecified: Secondary | ICD-10-CM | POA: Diagnosis present

## 2021-12-05 DIAGNOSIS — Z87891 Personal history of nicotine dependence: Secondary | ICD-10-CM | POA: Diagnosis not present

## 2021-12-05 DIAGNOSIS — J449 Chronic obstructive pulmonary disease, unspecified: Secondary | ICD-10-CM

## 2021-12-05 DIAGNOSIS — Z6832 Body mass index (BMI) 32.0-32.9, adult: Secondary | ICD-10-CM | POA: Diagnosis not present

## 2021-12-05 DIAGNOSIS — I2781 Cor pulmonale (chronic): Secondary | ICD-10-CM | POA: Diagnosis present

## 2021-12-05 DIAGNOSIS — J9621 Acute and chronic respiratory failure with hypoxia: Secondary | ICD-10-CM

## 2021-12-05 DIAGNOSIS — J9811 Atelectasis: Secondary | ICD-10-CM | POA: Diagnosis present

## 2021-12-05 DIAGNOSIS — E1122 Type 2 diabetes mellitus with diabetic chronic kidney disease: Secondary | ICD-10-CM | POA: Diagnosis present

## 2021-12-05 DIAGNOSIS — Z515 Encounter for palliative care: Secondary | ICD-10-CM | POA: Diagnosis not present

## 2021-12-05 DIAGNOSIS — L89311 Pressure ulcer of right buttock, stage 1: Secondary | ICD-10-CM | POA: Diagnosis present

## 2021-12-05 DIAGNOSIS — N1832 Chronic kidney disease, stage 3b: Secondary | ICD-10-CM

## 2021-12-05 DIAGNOSIS — I130 Hypertensive heart and chronic kidney disease with heart failure and stage 1 through stage 4 chronic kidney disease, or unspecified chronic kidney disease: Secondary | ICD-10-CM | POA: Diagnosis present

## 2021-12-05 DIAGNOSIS — J810 Acute pulmonary edema: Secondary | ICD-10-CM | POA: Diagnosis present

## 2021-12-05 DIAGNOSIS — Z881 Allergy status to other antibiotic agents status: Secondary | ICD-10-CM | POA: Diagnosis not present

## 2021-12-05 DIAGNOSIS — N179 Acute kidney failure, unspecified: Secondary | ICD-10-CM | POA: Diagnosis not present

## 2021-12-05 DIAGNOSIS — Z66 Do not resuscitate: Secondary | ICD-10-CM | POA: Diagnosis present

## 2021-12-05 DIAGNOSIS — Z888 Allergy status to other drugs, medicaments and biological substances status: Secondary | ICD-10-CM | POA: Diagnosis not present

## 2021-12-05 DIAGNOSIS — Z20822 Contact with and (suspected) exposure to covid-19: Secondary | ICD-10-CM | POA: Diagnosis present

## 2021-12-05 DIAGNOSIS — I5031 Acute diastolic (congestive) heart failure: Secondary | ICD-10-CM | POA: Diagnosis not present

## 2021-12-05 LAB — BASIC METABOLIC PANEL
Anion gap: 12 (ref 5–15)
BUN: 27 mg/dL — ABNORMAL HIGH (ref 8–23)
CO2: 24 mmol/L (ref 22–32)
Calcium: 9.3 mg/dL (ref 8.9–10.3)
Chloride: 102 mmol/L (ref 98–111)
Creatinine, Ser: 1.33 mg/dL — ABNORMAL HIGH (ref 0.44–1.00)
GFR, Estimated: 37 mL/min — ABNORMAL LOW (ref >60)
Glucose, Bld: 201 mg/dL — ABNORMAL HIGH (ref 70–99)
Potassium: 4.4 mmol/L (ref 3.5–5.1)
Sodium: 138 mmol/L (ref 135–145)

## 2021-12-05 LAB — GLUCOSE, CAPILLARY
Glucose-Capillary: 275 mg/dL — ABNORMAL HIGH (ref 70–99)
Glucose-Capillary: 374 mg/dL — ABNORMAL HIGH (ref 70–99)

## 2021-12-05 LAB — CBC
HCT: 52.9 % — ABNORMAL HIGH (ref 36.0–46.0)
Hemoglobin: 18 g/dL — ABNORMAL HIGH (ref 12.0–15.0)
MCH: 32.4 pg (ref 26.0–34.0)
MCHC: 34 g/dL (ref 30.0–36.0)
MCV: 95.3 fL (ref 80.0–100.0)
Platelets: 176 10*3/uL (ref 150–400)
RBC: 5.55 MIL/uL — ABNORMAL HIGH (ref 3.87–5.11)
RDW: 14.9 % (ref 11.5–15.5)
WBC: 6.6 10*3/uL (ref 4.0–10.5)
nRBC: 0 % (ref 0.0–0.2)

## 2021-12-05 LAB — RESP PANEL BY RT-PCR (FLU A&B, COVID) ARPGX2
Influenza A by PCR: NEGATIVE
Influenza B by PCR: NEGATIVE
SARS Coronavirus 2 by RT PCR: NEGATIVE

## 2021-12-05 LAB — MAGNESIUM: Magnesium: 2.4 mg/dL (ref 1.7–2.4)

## 2021-12-05 LAB — TROPONIN I (HIGH SENSITIVITY): Troponin I (High Sensitivity): 58 ng/L — ABNORMAL HIGH (ref <18)

## 2021-12-05 MED ORDER — DILTIAZEM HCL ER COATED BEADS 180 MG PO CP24
360.0000 mg | ORAL_CAPSULE | Freq: Every day | ORAL | Status: DC
  Administered 2021-12-05 – 2021-12-06 (×2): 360 mg via ORAL
  Filled 2021-12-05: qty 1
  Filled 2021-12-05: qty 2
  Filled 2021-12-05: qty 1

## 2021-12-05 MED ORDER — ONDANSETRON HCL 4 MG PO TABS: 4.0000 mg | ORAL_TABLET | Freq: Four times a day (QID) | ORAL | Status: DC | PRN

## 2021-12-05 MED ORDER — ARFORMOTEROL TARTRATE 15 MCG/2ML IN NEBU
15.0000 ug | INHALATION_SOLUTION | Freq: Two times a day (BID) | RESPIRATORY_TRACT | Status: DC
  Administered 2021-12-05 – 2021-12-08 (×8): 15 ug via RESPIRATORY_TRACT
  Filled 2021-12-05 (×9): qty 2

## 2021-12-05 MED ORDER — ONDANSETRON HCL 4 MG/2ML IJ SOLN: 4.0000 mg | Freq: Four times a day (QID) | INTRAMUSCULAR | Status: DC | PRN

## 2021-12-05 MED ORDER — IPRATROPIUM-ALBUTEROL 0.5-2.5 (3) MG/3ML IN SOLN
3.0000 mL | Freq: Four times a day (QID) | RESPIRATORY_TRACT | Status: DC | PRN
  Administered 2021-12-05: 3 mL via RESPIRATORY_TRACT
  Filled 2021-12-05 (×2): qty 3

## 2021-12-05 MED ORDER — REVEFENACIN 175 MCG/3ML IN SOLN
175.0000 ug | Freq: Every day | RESPIRATORY_TRACT | Status: DC
Start: 2021-12-05 — End: 2021-12-09
  Administered 2021-12-05 – 2021-12-08 (×4): 175 ug via RESPIRATORY_TRACT
  Filled 2021-12-05 (×5): qty 3

## 2021-12-05 MED ORDER — METHYLPREDNISOLONE SODIUM SUCC 125 MG IJ SOLR
60.0000 mg | INTRAMUSCULAR | Status: DC
  Administered 2021-12-05: 60 mg via INTRAVENOUS
  Filled 2021-12-05: qty 2

## 2021-12-05 MED ORDER — SPIRONOLACTONE 12.5 MG HALF TABLET
12.5000 mg | ORAL_TABLET | Freq: Every day | ORAL | Status: DC
  Filled 2021-12-05: qty 1

## 2021-12-05 MED ORDER — ENOXAPARIN SODIUM 30 MG/0.3ML IJ SOSY
30.0000 mg | PREFILLED_SYRINGE | INTRAMUSCULAR | Status: DC
  Administered 2021-12-05 – 2021-12-08 (×4): 30 mg via SUBCUTANEOUS
  Filled 2021-12-05 (×4): qty 0.3

## 2021-12-05 MED ORDER — BUDESONIDE 0.25 MG/2ML IN SUSP
0.2500 mg | Freq: Two times a day (BID) | RESPIRATORY_TRACT | Status: DC
  Administered 2021-12-05 – 2021-12-08 (×8): 0.25 mg via RESPIRATORY_TRACT
  Filled 2021-12-05 (×9): qty 2

## 2021-12-05 MED ORDER — ACETAMINOPHEN 325 MG PO TABS
650.0000 mg | ORAL_TABLET | Freq: Four times a day (QID) | ORAL | Status: DC | PRN
  Administered 2021-12-05: 650 mg via ORAL
  Filled 2021-12-05: qty 2

## 2021-12-05 MED ORDER — FUROSEMIDE 10 MG/ML IJ SOLN
40.0000 mg | Freq: Two times a day (BID) | INTRAMUSCULAR | Status: DC
  Administered 2021-12-05 – 2021-12-06 (×4): 40 mg via INTRAVENOUS
  Filled 2021-12-05 (×4): qty 4

## 2021-12-05 MED ORDER — INSULIN ASPART 100 UNIT/ML IJ SOLN
0.0000 [IU] | INTRAMUSCULAR | Status: DC
  Administered 2021-12-05: 9 [IU] via SUBCUTANEOUS
  Administered 2021-12-06: 5 [IU] via SUBCUTANEOUS
  Administered 2021-12-06: 7 [IU] via SUBCUTANEOUS
  Administered 2021-12-06: 5 [IU] via SUBCUTANEOUS
  Administered 2021-12-06: 2 [IU] via SUBCUTANEOUS
  Administered 2021-12-06: 9 [IU] via SUBCUTANEOUS
  Administered 2021-12-06: 3 [IU] via SUBCUTANEOUS
  Administered 2021-12-06: 5 [IU] via SUBCUTANEOUS
  Administered 2021-12-07 (×2): 3 [IU] via SUBCUTANEOUS
  Administered 2021-12-07: 7 [IU] via SUBCUTANEOUS
  Administered 2021-12-07: 1 [IU] via SUBCUTANEOUS
  Administered 2021-12-08: 3 [IU] via SUBCUTANEOUS
  Administered 2021-12-08: 1 [IU] via SUBCUTANEOUS
  Administered 2021-12-08: 2 [IU] via SUBCUTANEOUS
  Administered 2021-12-08: 3 [IU] via SUBCUTANEOUS

## 2021-12-05 MED ORDER — METHYLPREDNISOLONE SODIUM SUCC 40 MG IJ SOLR
40.0000 mg | Freq: Two times a day (BID) | INTRAMUSCULAR | Status: DC
Start: 2021-12-05 — End: 2021-12-05

## 2021-12-05 MED ORDER — SPIRONOLACTONE 25 MG PO TABS
25.0000 mg | ORAL_TABLET | Freq: Every day | ORAL | Status: DC
  Administered 2021-12-05 – 2021-12-08 (×4): 25 mg via ORAL
  Filled 2021-12-05 (×4): qty 1

## 2021-12-05 MED ORDER — METOPROLOL TARTRATE 12.5 MG HALF TABLET
12.5000 mg | ORAL_TABLET | Freq: Every day | ORAL | Status: DC
  Administered 2021-12-05 – 2021-12-08 (×4): 12.5 mg via ORAL
  Filled 2021-12-05 (×4): qty 1

## 2021-12-05 MED ORDER — SODIUM CHLORIDE 0.9% FLUSH
3.0000 mL | Freq: Two times a day (BID) | INTRAVENOUS | Status: DC
  Administered 2021-12-05 – 2021-12-09 (×9): 3 mL via INTRAVENOUS

## 2021-12-05 MED ORDER — ACETAMINOPHEN 650 MG RE SUPP: 650.0000 mg | Freq: Four times a day (QID) | RECTAL | Status: DC | PRN

## 2021-12-05 NOTE — ED Notes (Signed)
RT paged to discuss pt's sats dropping to high 80s. She has pulmonary fibrosis and COPD so will continue to monitor to maintain upper 80s.  ?

## 2021-12-05 NOTE — ED Notes (Signed)
Breakfast order placed ?

## 2021-12-05 NOTE — Hospital Course (Signed)
[name] is a 88 y.o. female with medical history significant for chronic respiratory failure with hypoxia on 5 L O2 via [place], COPD with pulmonary fibrosis, HFpEF (EF 55-60%), pulmonary hypertension, HTN who is admitted with acute on chronic respiratory failure with hypoxia due to acute on chronic HFpEF. ?

## 2021-12-05 NOTE — Assessment & Plan Note (Signed)
Has associated postinflammatory pulmonary fibrosis.  No wheezing at time of admission or evidence of acute COPD exacerbation. ?-Hold further steroids ?-DuoNeb as needed ?-Wean to home 4-5 L O2 via [place] as above ?

## 2021-12-05 NOTE — Progress Notes (Addendum)
PROGRESS NOTE        PATIENT DETAILS Name: [name] Age: 88 y.o. Sex: female Date of Birth: 1926-03-31 Admit Date: 12/04/2021 Admitting Physician Charlsie Quest, MD ZOX:[id number], Onalee Hua, MD  Brief Summary: [name] is a 88 y.o. female with medical history significant for chronic respiratory failure with hypoxia on 5 L O2 via [place], COPD with pulmonary fibrosis, HFpEF (EF 55-60%), pulmonary hypertension, HTN who presented with shortness of breath-found to have acute on chronic hypoxic respiratory failure due to HFpEF exacerbation.   Significant events: 4/15>> presented to ED with shortness of breath-initially required BiPAP-found to have acute on chronic hypoxic respiratory failure due to probable HFpEF exacerbation.  Significant studies: 4/15>> CXR: Pulmonary edema superimposed on chronic bibasilar scarring/fibrosis.  Significant microbiology data: 4/15>> COVID/influenza PCR: Negative  Procedures: None  Consults: None   Subjective: Feels somewhat better-still requiring anywhere from 6 to 8 L of oxygen  Objective: Vitals: Blood pressure (!) 147/98, pulse 82, temperature (!) 97 F (36.1 C), temperature source Axillary, resp. rate (!) 25, height 5\' 3"  (1.6 m), weight 79.4 kg, SpO2 90 %.   Exam: Gen Exam:Alert awake-not in any distress HEENT:atraumatic, normocephalic Chest: Bibasilar rales. CVS:S1S2 regular Abdomen:soft non tender, non distended Extremities: 3+ edema Neurology: Non focal Skin: no rash  Pertinent Labs/Radiology:    Latest Ref Rng & Units 12/05/2021    4:51 AM 12/04/2021   11:18 PM 12/04/2021   10:47 PM  CBC  WBC 4.0 - 10.5 K/uL 6.6    7.0    Hemoglobin 12.0 - 15.0 g/dL 40.9   81.1   91.4    Hematocrit 36.0 - 46.0 % 52.9   54.0   52.5    Platelets 150 - 400 K/uL 176    182      Lab Results  Component Value Date   NA 138 12/05/2021   K 4.4 12/05/2021   CL 102 12/05/2021   CO2 24 12/05/2021       Assessment/Plan: Acute on chronic hypoxic respiratory failure due to HFpEF exacerbation: Significantly volume overloaded-has more swelling than usual in her lower extremities-continue IV Lasix-Aldactone-and attempt to titrate down FiO2 as tolerated.  History of pulmonary fibrosis-with chronic hypoxic respiratory failure (on 4 L of oxygen at rest-5 L with ambulation): Continue supportive care-mild hypoxia due to CHF exacerbation.  COPD: Continue bronchodilators  CKD stage IIIb: Creatinine close to baseline.  HTN: BP stable-continue Lopressor, Aldactone and Cardizem.  Palliative care: DNR in place-gentle medical treatment-family does not desire aggressive care.  Obesity: Estimated body mass index is 31 kg/m as calculated from the following:   Height as of this encounter: 5\' 3"  (1.6 m).   Weight as of this encounter: 79.4 kg.   Code status:   Code Status: DNR   DVT Prophylaxis: enoxaparin (LOVENOX) injection 30 mg Start: 12/05/21 1400   Family Communication: Daughter at bedside   Disposition Plan: Status is: Inpatient Remains inpatient appropriate because: Severe hypoxia-significant volume overload-on IV furosemide-on higher dosing of oxygen than usual baseline-not yet stable for discharge.   Planned Discharge Destination:Home   Diet: Diet Order             Diet Heart Room service appropriate? Yes; Fluid consistency: Thin  Diet effective now  Antimicrobial agents: Anti-infectives (From admission, onward)    None        MEDICATIONS: Scheduled Meds:  diltiazem  360 mg Oral Daily   enoxaparin (LOVENOX) injection  30 mg Subcutaneous Q24H   furosemide  40 mg Intravenous BID   metoprolol tartrate  12.5 mg Oral Daily   sodium chloride flush  3 mL Intravenous Q12H   spironolactone  12.5 mg Oral Daily   Continuous Infusions: PRN Meds:.acetaminophen **OR** acetaminophen, ipratropium-albuterol, ondansetron **OR** ondansetron (ZOFRAN)  IV   I have personally reviewed following labs and imaging studies  LABORATORY DATA: CBC: Recent Labs  Lab 2021-12-24 2247 12-24-2021 2318 12/05/21 0451  WBC 7.0  --  6.6  NEUTROABS 5.6  --   --   HGB 17.5* 18.4* 18.0*  HCT 52.5* 54.0* 52.9*  MCV 96.7  --  95.3  PLT 182  --  176    Basic Metabolic Panel: Recent Labs  Lab 12/24/21 2247 12/24/21 2318 12/05/21 0451  NA 136 138 138  K 4.5 4.5 4.4  CL 101  --  102  CO2 25  --  24  GLUCOSE 179*  --  201*  BUN 27*  --  27*  CREATININE 1.39*  --  1.33*  CALCIUM 9.4  --  9.3  MG  --   --  2.4    GFR: Estimated Creatinine Clearance: 26.4 mL/min (A) (by C-G formula based on SCr of 1.33 mg/dL (H)).  Liver Function Tests: No results for input(s): AST, ALT, ALKPHOS, BILITOT, PROT, ALBUMIN in the last 168 hours. No results for input(s): LIPASE, AMYLASE in the last 168 hours. No results for input(s): AMMONIA in the last 168 hours.  Coagulation Profile: No results for input(s): INR, PROTIME in the last 168 hours.  Cardiac Enzymes: No results for input(s): CKTOTAL, CKMB, CKMBINDEX, TROPONINI in the last 168 hours.  BNP (last 3 results) No results for input(s): PROBNP in the last 8760 hours.  Lipid Profile: No results for input(s): CHOL, HDL, LDLCALC, TRIG, CHOLHDL, LDLDIRECT in the last 72 hours.  Thyroid Function Tests: No results for input(s): TSH, T4TOTAL, FREET4, T3FREE, THYROIDAB in the last 72 hours.  Anemia Panel: No results for input(s): VITAMINB12, FOLATE, FERRITIN, TIBC, IRON, RETICCTPCT in the last 72 hours.  Urine analysis: No results found for: COLORURINE, APPEARANCEUR, LABSPEC, PHURINE, GLUCOSEU, HGBUR, BILIRUBINUR, KETONESUR, PROTEINUR, UROBILINOGEN, NITRITE, LEUKOCYTESUR  Sepsis Labs: Lactic Acid, Venous No results found for: LATICACIDVEN  MICROBIOLOGY: Recent Results (from the past 240 hour(s))  Resp Panel by RT-PCR (Flu A&B, Covid) Nasopharyngeal Swab     Status: None   Collection Time: 12/05/21   1:18 AM   Specimen: Nasopharyngeal Swab; Nasopharyngeal(NP) swabs in vial transport medium  Result Value Ref Range Status   SARS Coronavirus 2 by RT PCR NEGATIVE NEGATIVE Final    Comment: (NOTE) SARS-CoV-2 target nucleic acids are NOT DETECTED.  The SARS-CoV-2 RNA is generally detectable in upper respiratory specimens during the acute phase of infection. The lowest concentration of SARS-CoV-2 viral copies this assay can detect is 138 copies/mL. A negative result does not preclude SARS-Cov-2 infection and should not be used as the sole basis for treatment or other patient management decisions. A negative result may occur with  improper specimen collection/handling, submission of specimen other than nasopharyngeal swab, presence of viral mutation(s) within the areas targeted by this assay, and inadequate number of viral copies(<138 copies/mL). A negative result must be combined with clinical observations, patient history, and epidemiological information. The expected result is  Negative.  Fact Sheet for Patients:  BloggerCourse.com  Fact Sheet for Healthcare Providers:  SeriousBroker.it  This test is no t yet approved or cleared by the [place] FDA and  has been authorized for detection and/or diagnosis of SARS-CoV-2 by FDA under an Emergency Use Authorization (EUA). This EUA will remain  in effect (meaning this test can be used) for the duration of the COVID-19 declaration under Section 564(b)(1) of the Act, 21 U.S.C.section 360bbb-3(b)(1), unless the authorization is terminated  or revoked sooner.       Influenza A by PCR NEGATIVE NEGATIVE Final   Influenza B by PCR NEGATIVE NEGATIVE Final    Comment: (NOTE) The Xpert Xpress SARS-CoV-2/FLU/RSV plus assay is intended as an aid in the diagnosis of influenza from Nasopharyngeal swab specimens and should not be used as a sole basis for treatment. Nasal washings and aspirates  are unacceptable for Xpert Xpress SARS-CoV-2/FLU/RSV testing.  Fact Sheet for Patients: BloggerCourse.com  Fact Sheet for Healthcare Providers: SeriousBroker.it  This test is not yet approved or cleared by the [place] FDA and has been authorized for detection and/or diagnosis of SARS-CoV-2 by FDA under an Emergency Use Authorization (EUA). This EUA will remain in effect (meaning this test can be used) for the duration of the COVID-19 declaration under Section 564(b)(1) of the Act, 21 U.S.C. section 360bbb-3(b)(1), unless the authorization is terminated or revoked.  Performed at [facility], [address] [place], [place] [address]     RADIOLOGY STUDIES/RESULTS: DG Chest Port 1 View  Result Date: 30-Dec-2021 CLINICAL DATA:  Short of breath EXAM: PORTABLE CHEST 1 VIEW COMPARISON:  05/19/2021 FINDINGS: Single frontal view of the chest demonstrates a stable cardiac silhouette. There is continued bibasilar scarring and fibrosis, with superimposed ground-glass airspace disease and small effusions. No pneumothorax. IMPRESSION: 1. Findings consistent with pulmonary edema superimposed upon chronic basilar scarring and fibrosis. 2. Small bilateral pleural effusions. Electronically Signed   By: Sharlet Salina M.D.   On: 2021-12-30 22:42     LOS: 0 days   Jeoffrey Massed, MD  [facility]    To contact the attending provider between 7A-7P or the covering provider during after hours 7P-7A, please log into the web site [email or web address] and access using universal  password for that web site. If you do not have the password, please call the hospital operator.  12/05/2021, 9:38 AM

## 2021-12-05 NOTE — Assessment & Plan Note (Signed)
Renal function at baseline, continue to monitor. ?

## 2021-12-05 NOTE — H&P (Signed)
?History and Physical  ? ? ?[name] [id number] DOB: 11-Oct-1925 DOA: 12/07/2021 ? ?PCP: Antony Contras, MD  ?Patient coming from: Home via EMS ? ?I have personally briefly reviewed patient's old medical records in [place] ? ?Chief Complaint: Shortness of breath ? ?HPI: ?[name] is a 88 y.o. female with medical history significant for chronic respiratory failure with hypoxia on 5 L O2 via [place], COPD with pulmonary fibrosis, HFpEF (EF 55-60%), pulmonary hypertension, HTN who presented to the ED for evaluation of dyspnea. ? ?Patient has chronic dyspnea since she was hospitalized in March 2020 for presumed COVID-pneumonia.  Since then she has had persistent mild postinfectious pulmonary fibrosis.  She uses supplemental oxygen at home, 4 L via [place] at rest and 5 L via [place] with activity.  Over the last few weeks she has had progressively worsening exertional dyspnea.  Symptoms significantly worsened today.  EMS were called to her home and she was noted to have an SPO2 of 78% while on 5 L O2 via [place].  She was placed on NRB with improvement to 98% and brought to the ED for further evaluation. ? ?Patient states that she has chronic lower extremity edema, worsening now compared to her baseline.  She has not had any orthopnea or PND.  She denies any chest pain.  She reports chronic cough productive of clear/white sputum, not significantly changed from baseline.  She denies any subjective fevers, chills, diaphoresis, nausea, vomiting, abdominal pain.  She takes Lasix 40 mg daily and reports good urine output.  She normally ambulates with the use of a rollator walker but has been having difficulty due to her dyspnea. ? ?ED Course  Labs/Imaging on admission: I have personally reviewed following labs and imaging studies. ? ?Initial vitals showed BP 133/76, pulse 82, RR 22, temp 97.0 ?F, SPO2 initially 92% on 6 L O2 via [place].  Patient subsequently placed on NRB then BiPAP to maintain SPO2 >92%. ? ?Labs show WBC 7.0,  hemoglobin 17.5, platelets 182,000, sodium 136, potassium 4.5, bicarb 25, BUN 27, creatinine 1.39, serum glucose 179, troponin 68, BNP 848.8. ? ?VBG showed pH 7.357, PCO2 51, PO2 79.  Respiratory panel in process. ? ?Portable chest x-ray shows pulmonary edema superimposed on chronic basilar scarring and fibrosis with small bilateral pleural effusions. ? ?Patient was given 125 mg Solu-Medrol, 2 g IV magnesium, DuoNeb treatment, IV Lasix 60 mg.  The hospitalist service was consulted to admit for further evaluation and management. ? ?Review of Systems: All systems reviewed and are negative except as documented in history of present illness above. ? ? ?Past Medical History:  ?Diagnosis Date  ? CHF (congestive heart failure) ([place])   ? COPD (chronic obstructive pulmonary disease) ([place])   ? Diabetes mellitus without complication ([place])   ? Hypertension   ? ? ?No past surgical history on file. ? ?Social History: ? reports that she quit smoking about 55 years ago. Her smoking use included cigarettes. She has a 12.75 pack-year smoking history. She has quit using smokeless tobacco. She reports that she does not currently use alcohol. She reports that she does not currently use drugs. ? ?Allergies  ?Allergen Reactions  ? Bisoprolol-Hydrochlorothiazide Other (See Comments)  ?  Other reaction(s): Cough (ALLERGY/intolerance)  ? Meperidine Nausea And Vomiting  ? Tetracycline Hives  ? ? ?No family history on file. ? ? ?Prior to Admission medications   ?Medication Sig Start Date End Date Taking? Authorizing Provider  ?cetirizine (ZYRTEC) 10 MG tablet Take  10 mg by mouth daily as needed for allergies.   Yes [provider]  ?diltiazem (CARDIZEM CD) 360 MG 24 hr capsule Take 360 mg by mouth daily.   Yes [provider]  ?furosemide (LASIX) 40 MG tablet OKAY TO TAKE EVERY OTHER DAY- RESUME TAKING DAILY IF YOU HAVE WORSENING SOB, LEG SWELLING, OR WEIGHT GAIN ?Patient taking differently: Take 40 mg by mouth daily. 03/23/20   Yes Elouise Munroe, MD  ?metoprolol tartrate (LOPRESSOR) 25 MG tablet Take 12.5 mg by mouth daily.   Yes [provider]  ?Multiple Vitamins-Minerals (PRESERVISION AREDS 2) CAPS Take 1 capsule by mouth 2 (two) times daily.    Yes [provider]  ?naproxen sodium (ALEVE) 220 MG tablet Take 220 mg by mouth daily as needed (headache/pain).   Yes [provider]  ?OXYGEN 4-5 L See admin instructions. 4 liters while resting ?5 liters when active   Yes [provider]  ?Polyethyl Glycol-Propyl Glycol (SYSTANE OP) Place 1 drop into both eyes daily as needed (dry eyes).   Yes [provider]  ?sodium chloride (OCEAN) 0.65 % SOLN nasal spray Place 1 spray into both nostrils as needed for congestion.   Yes [provider]  ?spironolactone (ALDACTONE) 25 MG tablet Take 0.5 tablets (12.5 mg total) by mouth daily. 04/22/19  Yes Elouise Munroe, MD  ? ? ?Physical Exam: ?Vitals:  ? 12/05/21 0030 12/05/21 0044 12/05/21 0045 12/05/21 0100  ?BP: 139/87  (!) 144/78 136/85  ?Pulse: 78  80 81  ?Resp: (!) 27  (!) 24 (!) 21  ?Temp:  (!) 97 ?F (36.1 ?C)    ?TempSrc:  Axillary    ?SpO2: 96%  98% 96%  ?Weight:    79.4 kg  ?Height:    5\' 3"  (1.6 m)  ? ?Constitutional: Pleasant elderly woman sitting up in bed with BiPAP in place.  NAD, calm, comfortable, answering questions appropriately. ?Eyes: PERRL, lids and conjunctivae normal ?ENMT: Mucous membranes are moist. Posterior pharynx clear of any exudate or lesions.Normal dentition.  ?Neck: normal, supple, no masses. ?Respiratory: clear to auscultation bilaterally while on BiPAP. Normal respiratory effort while on BiPAP. No accessory muscle use.  ?Cardiovascular: Regular rate and rhythm, no murmurs / rubs / gallops.  +3 bilateral lower extremity edema.  ?Abdomen: no tenderness, no masses palpated. Bowel sounds positive.  ?Musculoskeletal: no clubbing / cyanosis. No joint deformity upper and lower extremities. Good ROM, no  contractures. Normal muscle tone.  ?Skin: no rashes, lesions, ulcers. No induration ?Neurologic: Sensation intact. Strength 5/5 in all 4.  ?Psychiatric: Normal judgment and insight. Alert and oriented x 3. Normal mood.  ? ?EKG: Personally reviewed. Sinus rhythm, rate 80, QTc 538, motion artifact.  When compared to previous EKG from March 2020 ventricular bigeminy is no longer present. ? ?Assessment/Plan ?Principal Problem: ?  Acute on chronic respiratory failure with hypoxia ([place]) ?Active Problems: ?  Acute on chronic heart failure with preserved ejection fraction (HFpEF) ([place]) ?  COPD  GOLD I ?  Essential hypertension ?  Chronic kidney disease, stage 3b ([place]) ?  ?VALRIE COVILLE is a 88 y.o. female with medical history significant for chronic respiratory failure with hypoxia on 5 L O2 via [place], COPD with pulmonary fibrosis, HFpEF (EF 55-60%), pulmonary hypertension, HTN who is admitted with acute on chronic respiratory failure with hypoxia due to acute on chronic HFpEF. ? ?Assessment and Plan: ?Acute on chronic heart failure with preserved ejection fraction (HFpEF) ([place]) ?Acute on chronic respiratory failure  with hypoxia ?Pulmonary hypertension ?Hypoxic to 78% on home 5 L O2 via [place], requiring BiPAP on admission with significant improvement in symptoms.  CXR shows pulmonary edema superimposed on chronic fibrosis.  BNP elevated at 848.8.  Mild troponin elevation likely demand ischemia, she denies any chest pain.  She received IV Lasix 60 mg in the ED. ?-Continue IV Lasix 40 mg twice daily ?-Continue BiPAP as needed ?-Wean to home 4-5 L O2 via [place] as able ?-Update echocardiogram ?-Continue home Lopressor 12.5 mg daily, spironolactone 12.5 mg daily ? ?COPD  GOLD I ?Has associated postinflammatory pulmonary fibrosis.  No wheezing at time of admission or evidence of acute COPD exacerbation. ?-Hold further steroids ?-DuoNeb as needed ?-Wean to home 4-5 L O2 via  as above ? ?Chronic kidney disease, stage 3b ([place]) ?Renal  function at baseline, continue to monitor. ? ?Essential hypertension ?Continue home Lopressor, spironolactone, diltiazem. ? ?DVT prophylaxis: enoxaparin (LOVENOX) injection 30 mg Start: 12/05/21 1400 ?Code Status: DNR, co

## 2021-12-05 NOTE — Progress Notes (Signed)
Pt taken off bipap, placed on [place] 6L with no complications. ?

## 2021-12-05 NOTE — ED Notes (Addendum)
Resting comfortably on 5L [place], SPO2 is 88% consistently. Intermittent poor signal/ pleth reads 81-90%. RT notified of room change.  ?

## 2021-12-05 NOTE — Assessment & Plan Note (Signed)
Continue home Lopressor, spironolactone, diltiazem. ?

## 2021-12-05 NOTE — Progress Notes (Signed)
No BIPAP needed at this time. VS stable. RT will Monitor.  ?

## 2021-12-05 NOTE — Progress Notes (Signed)
Placed patient on 13 LHF [place] nasal cannula due to desaturation to 77% on 6LNC, SATS improved to 90% ?

## 2021-12-05 NOTE — Assessment & Plan Note (Addendum)
Acute on chronic respiratory failure with hypoxia ?Pulmonary hypertension ?Hypoxic to 78% on home 5 L O2 via [place], requiring BiPAP on admission with significant improvement in symptoms.  CXR shows pulmonary edema superimposed on chronic fibrosis.  BNP elevated at 848.8.  Mild troponin elevation likely demand ischemia, she denies any chest pain.  She received IV Lasix 60 mg in the ED. ?-Continue IV Lasix 40 mg twice daily ?-Continue BiPAP as needed ?-Wean to home 4-5 L O2 via [place] as able ?-Update echocardiogram ?-Continue home Lopressor 12.5 mg daily, spironolactone 12.5 mg daily ?

## 2021-12-05 NOTE — ED Notes (Addendum)
RT at [facility]. Room temp increased for pt comfort. ?

## 2021-12-06 ENCOUNTER — Inpatient Hospital Stay (HOSPITAL_COMMUNITY): Payer: Medicare PPO

## 2021-12-06 ENCOUNTER — Encounter: Payer: Self-pay | Admitting: Internal Medicine

## 2021-12-06 DIAGNOSIS — L8990 Pressure ulcer of unspecified site, unspecified stage: Secondary | ICD-10-CM | POA: Insufficient documentation

## 2021-12-06 DIAGNOSIS — I5033 Acute on chronic diastolic (congestive) heart failure: Secondary | ICD-10-CM | POA: Diagnosis not present

## 2021-12-06 DIAGNOSIS — N1832 Chronic kidney disease, stage 3b: Secondary | ICD-10-CM | POA: Diagnosis not present

## 2021-12-06 DIAGNOSIS — I5031 Acute diastolic (congestive) heart failure: Secondary | ICD-10-CM | POA: Diagnosis not present

## 2021-12-06 DIAGNOSIS — J449 Chronic obstructive pulmonary disease, unspecified: Secondary | ICD-10-CM | POA: Diagnosis not present

## 2021-12-06 DIAGNOSIS — J9621 Acute and chronic respiratory failure with hypoxia: Secondary | ICD-10-CM | POA: Diagnosis not present

## 2021-12-06 LAB — GLUCOSE, CAPILLARY
Glucose-Capillary: 177 mg/dL — ABNORMAL HIGH (ref 70–99)
Glucose-Capillary: 205 mg/dL — ABNORMAL HIGH (ref 70–99)
Glucose-Capillary: 253 mg/dL — ABNORMAL HIGH (ref 70–99)
Glucose-Capillary: 265 mg/dL — ABNORMAL HIGH (ref 70–99)
Glucose-Capillary: 299 mg/dL — ABNORMAL HIGH (ref 70–99)
Glucose-Capillary: 315 mg/dL — ABNORMAL HIGH (ref 70–99)
Glucose-Capillary: 375 mg/dL — ABNORMAL HIGH (ref 70–99)

## 2021-12-06 LAB — HEMOGLOBIN A1C
Hgb A1c MFr Bld: 8.5 % — ABNORMAL HIGH (ref 4.8–5.6)
Mean Plasma Glucose: 197.25 mg/dL

## 2021-12-06 LAB — ECHOCARDIOGRAM COMPLETE
Area-P 1/2: 3.6 cm2
Calc EF: 67.9 %
Height: 63 in
S' Lateral: 2.2 cm
Single Plane A2C EF: 68 %
Single Plane A4C EF: 66.7 %
Weight: 2876.8 oz

## 2021-12-06 LAB — BASIC METABOLIC PANEL
Anion gap: 10 (ref 5–15)
BUN: 39 mg/dL — ABNORMAL HIGH (ref 8–23)
CO2: 22 mmol/L (ref 22–32)
Calcium: 9 mg/dL (ref 8.9–10.3)
Chloride: 102 mmol/L (ref 98–111)
Creatinine, Ser: 1.72 mg/dL — ABNORMAL HIGH (ref 0.44–1.00)
GFR, Estimated: 27 mL/min — ABNORMAL LOW (ref >60)
Glucose, Bld: 298 mg/dL — ABNORMAL HIGH (ref 70–99)
Potassium: 5.1 mmol/L (ref 3.5–5.1)
Sodium: 134 mmol/L — ABNORMAL LOW (ref 135–145)

## 2021-12-06 LAB — CBC
HCT: 49.3 % — ABNORMAL HIGH (ref 36.0–46.0)
Hemoglobin: 16.4 g/dL — ABNORMAL HIGH (ref 12.0–15.0)
MCH: 31.7 pg (ref 26.0–34.0)
MCHC: 33.3 g/dL (ref 30.0–36.0)
MCV: 95.4 fL (ref 80.0–100.0)
Platelets: 180 10*3/uL (ref 150–400)
RBC: 5.17 MIL/uL — ABNORMAL HIGH (ref 3.87–5.11)
RDW: 14.6 % (ref 11.5–15.5)
WBC: 8.6 10*3/uL (ref 4.0–10.5)
nRBC: 0 % (ref 0.0–0.2)

## 2021-12-06 LAB — PROCALCITONIN: Procalcitonin: <0.1 ng/mL

## 2021-12-06 LAB — SEDIMENTATION RATE: Sed Rate: 1 mm/hr (ref 0–22)

## 2021-12-06 NOTE — Progress Notes (Addendum)
?      ?                 PROGRESS NOTE ? ?      ?PATIENT DETAILS ?Name: [name] ?Age: 88 y.o. ?Sex: female ?Date of Birth: 1926/07/24 ?Admit Date: 12/13/2021 ?Admitting Physician Lenore Cordia, MD ?ZRA:[id number], Shanon Brow, MD ? ?Brief Summary: ?[name] is a 88 y.o. female with medical history significant for chronic respiratory failure with hypoxia on 5 L O2 via [place], COPD with pulmonary fibrosis, HFpEF (EF 55-60%), pulmonary hypertension, HTN who presented with shortness of breath-found to have acute on chronic hypoxic respiratory failure due to HFpEF exacerbation.  ? ?Significant events: ?4/15>> presented to ED with shortness of breath-initially required BiPAP-found to have acute on chronic hypoxic respiratory failure due to probable HFpEF exacerbation. ? ?Significant studies: ?4/15>> CXR: Pulmonary edema superimposed on chronic bibasilar scarring/fibrosis. ?4/17>> CXR: Increased moderate bilateral pleural effusions, with likely atelectasis.  Mild pulm edema. ?4/17>> ESR: 1 ?4/17>> procalcitonin:<0.10 ?4/17>> A1c:8.5 ? ?Significant microbiology data: ?4/15>> COVID/influenza PCR: Negative ? ?Procedures: ?None ? ?Consults: ?None  ? ?Subjective: ?Feels somewhat better-when her O2 saturation probe was switched to a year-she was able to be titrated down from 15 L of HFNC to just 5 L.  Acknowledges significant exertional dyspnea for approximately 6 weeks. ? ?Objective: ?Vitals: ?Blood pressure 119/80, pulse 68, temperature (!) 97.4 ?F (36.3 ?C), temperature source Oral, resp. rate 20, height _0  (1.6 m), weight 81.6 kg, SpO2 92 %.  ? ?Exam: ?Gen Exam:Alert awake-not in any distress ?HEENT:atraumatic, normocephalic ?Chest: Bibasilar rales. ?CVS:S1S2 regular ?Abdomen:soft non tender, non distended ?Extremities:++ edema ?Neurology: Non focal ?Skin: no rash  ? ?Pertinent Labs/Radiology: ? ?  Latest Ref Rng & Units 12/06/2021  ?  2:19 AM 12/05/2021  ?  4:51 AM 12/03/2021  ? 11:18 PM  ?CBC  ?WBC 4.0 - 10.5 K/uL 8.6   6.6      ?Hemoglobin 12.0 - 15.0 g/dL 16.4   18.0   18.4    ?Hematocrit 36.0 - 46.0 % 49.3   52.9   54.0    ?Platelets 150 - 400 K/uL 180   176     ?  ?Lab Results  ?Component Value Date  ? NA 134 (L) 12/06/2021  ? K 5.1 12/06/2021  ? CL 102 12/06/2021  ? CO2 22 12/06/2021  ? ?  ? ? ?Assessment/Plan: ?Acute on chronic hypoxic respiratory failure due to HFpEF exacerbation: Remains of significantly volume overloaded-hypoxia relatively stable-on 5 L of HFNC this morning (O2 probe on finger is unreliable-subsequently switched to earlobe).  Even though mild increase in serum creatinine-she remains significantly volume overloaded and hypoxic-hence we will continue with IV Lasix/Aldactone.   ? ?History of pulmonary fibrosis-with chronic hypoxic respiratory failure (on 4 L of oxygen at rest-5 L with ambulation): Continue supportive care-no evidence of pulmonary fibrosis flare-ESR essentially normal-stop steroids. ? ?COPD: Continue bronchodilators ? ?AKI CKD stage IIIb: Slight bump in creatinine due to diuretics-given that she remains volume overloaded-suspect will need to continue diuretic regimen and tolerate some amount of mild AKI.   ? ?HTN: BP stable-continue Lopressor, Aldactone and Cardizem. ? ?DM-2: Unclear whether this is a new diagnosis-probably worsened by steroids that she received for the past few days-allow permissive hyperglycemia-she is not a candidate for aggressive glycemic control.  Continue SSI-and reassess glycemic control on 4/18. ? ?Palliative care: DNR in place-gentle medical treatment-Long discussion with patient's daughter over the phone on 4/17-continue medical treatment is much as  we can-if no improvement-we will get palliative care team involved.  Will reassess in a couple of days. ? ?Obesity: ?Estimated body mass index is 31.85 kg/m? as calculated from the following: ?  Height as of this encounter: _0  (1.6 m). ?  Weight as of this encounter: 81.6 kg.  ? ?Code status: ?  Code Status: DNR  ? ?DVT  Prophylaxis: ?enoxaparin (LOVENOX) injection 30 mg Start: 12/05/21 1400 ?  ?Family Communication: Daughter -Nancy-[phone] updated over the phone on 4/17. ? ? ?Disposition Plan: ?Status is: Inpatient ?Remains inpatient appropriate because: Severe hypoxia-significant volume overload-on IV furosemide-on higher dosing of oxygen than usual baseline-not yet stable for discharge. ?  ?Planned Discharge Destination:Home ? ? ?Diet: ?Diet Order   ? ?       ?  Diet Heart Room service appropriate? Yes; Fluid consistency: Thin  Diet effective now       ?  ? ?  ?  ? ?  ?  ? ? ?Antimicrobial agents: ?Anti-infectives (From admission, onward)  ? ? None  ? ?  ? ? ? ?MEDICATIONS: ?Scheduled Meds: ? arformoterol  15 mcg Nebulization BID  ? budesonide (PULMICORT) nebulizer solution  0.25 mg Nebulization BID  ? diltiazem  360 mg Oral Daily  ? enoxaparin (LOVENOX) injection  30 mg Subcutaneous Q24H  ? furosemide  40 mg Intravenous BID  ? insulin aspart  0-9 Units Subcutaneous Q4H  ? metoprolol tartrate  12.5 mg Oral Daily  ? revefenacin  175 mcg Nebulization Daily  ? sodium chloride flush  3 mL Intravenous Q12H  ? spironolactone  25 mg Oral Daily  ? ?Continuous Infusions: ?PRN Meds:.acetaminophen **OR** acetaminophen, ipratropium-albuterol, ondansetron **OR** ondansetron (ZOFRAN) IV ? ? ?I have personally reviewed following labs and imaging studies ? ?LABORATORY DATA: ?CBC: ?Recent Labs  ?Lab 11/29/2021 ?2247 11/29/2021 ?2318 12/05/21 ?0451 12/06/21 ?1761  ?WBC 7.0  --  6.6 8.6  ?NEUTROABS 5.6  --   --   --   ?HGB 17.5* 18.4* 18.0* 16.4*  ?HCT 52.5* 54.0* 52.9* 49.3*  ?MCV 96.7  --  95.3 95.4  ?PLT 182  --  176 180  ? ? ? ?Basic Metabolic Panel: ?Recent Labs  ?Lab 11/29/2021 ?2247 12/14/2021 ?2318 12/05/21 ?0451 12/06/21 ?6073  ?NA 136 138 138 134*  ?K 4.5 4.5 4.4 5.1  ?CL 101  --  102 102  ?CO2 25  --  24 22  ?GLUCOSE 179*  --  201* 298*  ?BUN 27*  --  27* 39*  ?CREATININE 1.39*  --  1.33* 1.72*  ?CALCIUM 9.4  --  9.3 9.0  ?MG  --   --  2.4   --   ? ? ? ?GFR: ?Estimated Creatinine Clearance: 20.7 mL/min (A) (by C-G formula based on SCr of 1.72 mg/dL (H)). ? ?Liver Function Tests: ?No results for input(s): AST, ALT, ALKPHOS, BILITOT, PROT, ALBUMIN in the last 168 hours. ?No results for input(s): LIPASE, AMYLASE in the last 168 hours. ?No results for input(s): AMMONIA in the last 168 hours. ? ?Coagulation Profile: ?No results for input(s): INR, PROTIME in the last 168 hours. ? ?Cardiac Enzymes: ?No results for input(s): CKTOTAL, CKMB, CKMBINDEX, TROPONINI in the last 168 hours. ? ?BNP (last 3 results) ?No results for input(s): PROBNP in the last 8760 hours. ? ?Lipid Profile: ?No results for input(s): CHOL, HDL, LDLCALC, TRIG, CHOLHDL, LDLDIRECT in the last 72 hours. ? ?Thyroid Function Tests: ?No results for input(s): TSH, T4TOTAL, FREET4, T3FREE, THYROIDAB in the last 72 hours. ? ?  Anemia Panel: ?No results for input(s): VITAMINB12, FOLATE, FERRITIN, TIBC, IRON, RETICCTPCT in the last 72 hours. ? ?Urine analysis: ?No results found for: COLORURINE, APPEARANCEUR, [place], [place], [place], [place], [place], KETONESUR, PROTEINUR, [place], NITRITE, LEUKOCYTESUR ? ?Sepsis Labs: ?Lactic Acid, Venous ?No results found for: LATICACIDVEN ? ?MICROBIOLOGY: ?Recent Results (from the past 240 hour(s))  ?Resp Panel by RT-PCR (Flu A&B, Covid) Nasopharyngeal Swab     Status: None  ? Collection Time: 12/05/21  1:18 AM  ? Specimen: Nasopharyngeal Swab; Nasopharyngeal(NP) swabs in vial transport medium  ?Result Value Ref Range Status  ? SARS Coronavirus 2 by RT PCR NEGATIVE NEGATIVE Final  ?  Comment: (NOTE) ?SARS-CoV-2 target nucleic acids are NOT DETECTED. ? ?The SARS-CoV-2 RNA is generally detectable in upper respiratory ?specimens during the acute phase of infection. The lowest ?concentration of SARS-CoV-2 viral copies this assay can detect is ?138 copies/mL. A negative result does not preclude SARS-Cov-2 ?infection and should not be used as the sole basis for  treatment or ?other patient management decisions. A negative result may occur with  ?improper specimen collection/handling, submission of specimen other ?than nasopharyngeal swab, presence of viral mutation

## 2021-12-06 NOTE — Evaluation (Signed)
Physical Therapy Evaluation ?Patient Details ?Name: [name] ?MRN: [id number] ?DOB: 08-Sep-1925 ?Today's Date: 12/06/2021 ? ?History of Present Illness ? Patient is a 88 yo female presenting to the ED with SOB and increased edema in upper and lower extremities on 12/03/2021. Admitted with respiratory failure. PMH includes: CHF, COPD, pulmonary fibrosis, HFpEF (EF 55-60%), pulmonary hypertension, HTN, COVID, uses O2 at home  ?Clinical Impression ? Pt was previously mod I with functional mobility and ADLs on 5L O2 at baseline and with use of rollator.  Pt is currently on 10L HFNC and is able to transfer to chair with supervision but experiences fatigue with activity and no further amb is performed at this time.  Unfortunately, pt does not have reliable source of O2 monitoring and sats read in 70s with activity; NSG aware and working on switching patient to forehead monitoring.  Pt demos dec activity tolerance and dec balance and would benefit from skilled PT to address deficits indicated in initial eval and perform further gait and stair assessment/training.  Pt reports having appropriate level of care at home and will be safe to return home with [facility] PT upon D/C. ?   ? ?Recommendations for follow up therapy are one component of a multi-disciplinary discharge planning process, led by the attending physician.  Recommendations may be updated based on patient status, additional functional criteria and insurance authorization. ? ?Follow Up Recommendations Home health PT ? ?  ?Assistance Recommended at Discharge Intermittent Supervision/Assistance  ?Patient can return home with the following ? A little help with walking and/or transfers;A little help with bathing/dressing/bathroom;Assistance with cooking/housework;Assist for transportation;Help with stairs or ramp for entrance ? ?  ?Equipment Recommendations None recommended by PT (Pt owns rollator)  ?Recommendations for Other Services ?    ?  ?Functional Status Assessment  Patient has had a recent decline in their functional status and demonstrates the ability to make significant improvements in function in a reasonable and predictable amount of time.  ? ?  ?Precautions / Restrictions Precautions ?Precautions: Other (comment) ?Precaution Comments: monitor O2 sats ?Restrictions ?Weight Bearing Restrictions: No  ? ?  ? ?Mobility ? Bed Mobility ?Overal bed mobility: Modified Independent ?  ?  ?  ?  ?  ?  ?General bed mobility comments: Demos sup > sit and sit > sup x 2 without any need for assistance. ?Patient Response: Cooperative ? ?Transfers ?Overall transfer level: Needs assistance ?  ?Transfers: Sit to/from Stand, Bed to chair/wheelchair/BSC ?Sit to Stand: Supervision ?Stand pivot transfers: Supervision ?  ?  ?  ?  ?General transfer comment: Pt declines need for RW to transfer to chair.  Performs sit > stance and stand pivot with supervision and assist for lines only.  O2 sat reading says 71% with activity, but waveform is poor and pt states that pulse ox on ear is not working properly and they are going to be moving her to a forehead monitor.  Pt denies any feeling of SOB on 10L HFNC but does c/o faitgue. ?  ? ?Ambulation/Gait ?  ?  ?  ?  ?  ?  ?  ?  ? ?Stairs ?  ?  ?  ?  ?  ? ?Wheelchair Mobility ?  ? ?Modified Rankin (Stroke Patients Only) ?  ? ?  ? ?Balance Overall balance assessment: Mild deficits observed, not formally tested ?  ?  ?  ?  ?  ?  ?  ?  ?  ?  ?  ?  ?  ?  ?  ?  ?  ?  ?   ? ? ? ?  Pertinent Vitals/Pain Pain Assessment ?Pain Assessment: 0-10 ?Pain Score: 0-No pain  ? ? ?Home Living Family/patient expects to be discharged to:: Private residence ?Living Arrangements: Children (States she has 3 daughters, thy alternate who stays with her.  Has help available at all times.) ?Available Help at Discharge: Family;Available 24 hours/day ?Type of Home: House ?Home Access: Stairs to enter ?Entrance Stairs-Rails: Can reach both ?Entrance Stairs-Number of Steps: 3 ?  ?Home Layout:  One level ?Home Equipment: Rollator (4 wheels) ?Additional Comments: Used 5L at baseline  ?  ?Prior Function Prior Level of Function : Independent/Modified Independent ?  ?  ?  ?  ?  ?  ?Mobility Comments: walked with a rollator, was on 5L O2 baseline ?ADLs Comments: was able to complete showering and dressing independently, does some cooking seated on her rollator, does not drive ?  ? ? ?Hand Dominance  ?   ? ?  ?Extremity/Trunk Assessment  ? Upper Extremity Assessment ?Upper Extremity Assessment: Defer to OT evaluation ?  ? ?Lower Extremity Assessment ?Lower Extremity Assessment: Overall WFL for tasks assessed ?  ? ?Cervical / Trunk Assessment ?Cervical / Trunk Assessment: Normal  ?Communication  ? Communication: No difficulties  ?Cognition Arousal/Alertness: Awake/alert ?Behavior During Therapy: [facility] for tasks assessed/performed ?Overall Cognitive Status: Within Functional Limits for tasks assessed ?  ?  ?  ?  ?  ?  ?  ?  ?  ?  ?  ?  ?  ?  ?  ?  ?General Comments: Pt. is supine in bed when PT arrives.  Alert and oriented x 3.  Agreeable to work with PT, but then lunch arrives and she requests to eat first.  States breathing has been feeling better. ?  ?  ? ?  ?General Comments   ? ?  ?Exercises    ? ?Assessment/Plan  ?  ?PT Assessment Patient needs continued PT services  ?PT Problem List Decreased mobility;Decreased activity tolerance;Decreased balance;Cardiopulmonary status limiting activity ? ?   ?  ?PT Treatment Interventions DME instruction;Therapeutic exercise;Gait training;Balance training;Stair training;Functional mobility training;Therapeutic activities;Patient/family education   ? ?PT Goals (Current goals can be found in the Care Plan section)  ?Acute Rehab PT Goals ?Patient Stated Goal: Pt's goal is to return home. ?PT Goal Formulation: With patient ?Time For Goal Achievement: 12/06/21 ?Potential to Achieve Goals: Good ? ?  ?Frequency Min 3X/week ?  ? ? ?Co-evaluation   ?  ?  ?  ?  ? ? ?  ?AM-PAC PT "6  Clicks" Mobility  ?Outcome Measure Help needed turning from your back to your side while in a flat bed without using bedrails?: None ?Help needed moving from lying on your back to sitting on the side of a flat bed without using bedrails?: None ?Help needed moving to and from a bed to a chair (including a wheelchair)?: A Little ?Help needed standing up from a chair using your arms (e.g., wheelchair or bedside chair)?: A Little ?Help needed to walk in hospital room?: A Little ?Help needed climbing 3-5 steps with a railing? : A Lot ?6 Click Score: 19 ? ?  ?End of Session Equipment Utilized During Treatment: Gait belt ?Activity Tolerance: Patient tolerated treatment well;Patient limited by fatigue ?Patient left: in chair;with call bell/phone within reach;with chair alarm set ?  ?PT Visit Diagnosis: Other abnormalities of gait and mobility (R26.89) ?  ? ?Time: 1200-1230 ?PT Time Calculation (min) (ACUTE ONLY): 30 min ? ? ?Charges:   PT Evaluation ?$PT Eval Low Complexity: 1 Low ?  PT Treatments ?$Therapeutic Activity: 8-22 mins ?  ?   ?Jasher Barkan A. Ferne Ellingwood, PT, DPT ?Acute Rehabilitation Services ?Office: [phone]  ? ?[place] ?12/06/2021, 12:44 PM ? ?

## 2021-12-06 NOTE — Progress Notes (Signed)
Heart Failure Navigator Progress Note ? ?Assessed for Heart & Vascular TOC clinic readiness.  ?Patient does not meet criteria due to No benefit at this time, Respiratory driven, HX COPD, Pulmonary fibrosis.  ? ? ? ?Rhae Hammock, BSN, RN ?Heart Failure Nurse Navigator ?Secure Chat Only   ?

## 2021-12-06 NOTE — Evaluation (Signed)
Occupational Therapy Evaluation Patient Details Name: [name] MRN: [id number] DOB: 1925-10-21 Today's Date: 12/06/2021   History of Present Illness Patient is a 88 yo female presenting to the ED with SOB and increased edema in upper and lower extremities on 12/04/21. Admitted with respiratory failure. PMH includes: CHF, COPD, pulmonary fibrosis, HFpEF (EF 55-60%), pulmonary hypertension, HTN, COVID, uses O2 at home   Clinical Impression   Prior to this admission, patient was living at home with her daughters taking turns living with her to ensure safety. Patient was on 4-5L of oxygen at home, ambulating with a rollator, and independent in ADLs. Patient manages her own medications, does occasional simple cooking, but no longer drives. Currently, patient presenting with need for increased oxygen (10L to remain at 90%), increased edema in bilateral lower extremities, and increased RR when completing bed mobility. Patient at mod A for lower body ADLs due to edema and fatigue, but can complete bed mobility at mod I level. Patient declining transfers with this therapist, with patient in agreement to attempt with PT later this date. Daughters present for session, with daughters asking about patients sleeping situation. Patient sleeps in an arm chair and footstool at night, with patient and daughters in agreement that transitioning to a recliner for sleeping would be safer and more comfortable. Patient and family declining need for a hospital bed at this time. OT recommending HHOT services once medically ready for discharge. OT will continue to follow acutely.      Recommendations for follow up therapy are one component of a multi-disciplinary discharge planning process, led by the attending physician.  Recommendations may be updated based on patient status, additional functional criteria and insurance authorization.   Follow Up Recommendations  Home health OT    Assistance Recommended at Discharge  Intermittent Supervision/Assistance  Patient can return home with the following A little help with walking and/or transfers;A little help with bathing/dressing/bathroom;Assistance with cooking/housework;Assist for transportation;Help with stairs or ramp for entrance    Functional Status Assessment  Patient has had a recent decline in their functional status and demonstrates the ability to make significant improvements in function in a reasonable and predictable amount of time.  Equipment Recommendations  None recommended by OT (Patient has equipment needed)    Recommendations for Other Services       Precautions / Restrictions Precautions Precautions: Other (comment) Precaution Comments: watch O2 Restrictions Weight Bearing Restrictions: No      Mobility Bed Mobility Overal bed mobility: Modified Independent             General bed mobility comments: Able to transition from supine to sit and return to supine with no physical assist or extra time, HOB elevated, but at baseline patient sleeps in an arm chair with a footstool at home    Transfers                   General transfer comment: Declined due to fatigue, encouraged to attempt with PT later with patient and daughters in agreement      Balance Overall balance assessment: Mild deficits observed, not formally tested                                         ADL either performed or assessed with clinical judgement   ADL Overall ADL's : Needs assistance/impaired Eating/Feeding: Set up;Sitting   Grooming: Set up;Sitting  Upper Body Bathing: Set up;Sitting   Lower Body Bathing: Moderate assistance;Sitting/lateral leans;Sit to/from stand   Upper Body Dressing : Set up;Sitting   Lower Body Dressing: Moderate assistance;Sitting/lateral leans;Sit to/from stand   Toilet Transfer: Moderate assistance;Rolling walker (2 wheels)           Functional mobility during ADLs: Moderate  assistance;Cueing for sequencing;Cueing for safety General ADL Comments: Patient presenting with decreased activity tolerance, need for increased oxygent to remain above 90 (10 L) and increased RR when completing basic bed mobility     Vision Baseline Vision/History: 1 Wears glasses;6 Macular Degeneration (readers) Ability to See in Adequate Light: 0 Adequate Patient Visual Report: No change from baseline       Perception     Praxis      Pertinent Vitals/Pain Pain Assessment Pain Assessment: No/denies pain     Hand Dominance     Extremity/Trunk Assessment Upper Extremity Assessment Upper Extremity Assessment: Overall WFL for tasks assessed   Lower Extremity Assessment Lower Extremity Assessment: Defer to PT evaluation   Cervical / Trunk Assessment Cervical / Trunk Assessment: Normal   Communication Communication Communication: HOH   Cognition Arousal/Alertness: Awake/alert Behavior During Therapy: WFL for tasks assessed/performed Overall Cognitive Status: Within Functional Limits for tasks assessed                                 General Comments: demonstrates good aniticipatory awareness, manages all her medications at home and is able to state them to OT, highly motivated     General Comments       Exercises     Shoulder Instructions      Home Living Family/patient expects to be discharged to:: Private residence Living Arrangements: Children Available Help at Discharge: Family;Available 24 hours/day Type of Home: House Home Access: Stairs to enter [employer] of Steps: 3 Entrance Stairs-Rails: Can reach both Home Layout: One level     Bathroom Shower/Tub: Producer, television/film/video: Handicapped height Bathroom Accessibility: Yes How Accessible: Accessible via walker Home Equipment: Rolling Walker (2 wheels);Rollator (4 wheels);BSC/3in1;Shower seat;Grab bars - tub/shower;Grab bars - toilet;Adaptive equipment Adaptive  Equipment: Sock aid        Prior Functioning/Environment Prior Level of Function : Independent/Modified Independent;Needs assist             Mobility Comments: walked with a rollator, was on 5L O2 baseline ADLs Comments: was able to complete showering and dressing independently, does some cooking seated on her rollator, does not drive        OT Problem List: Decreased activity tolerance;Impaired balance (sitting and/or standing);Cardiopulmonary status limiting activity;Impaired sensation;Increased edema      OT Treatment/Interventions: Self-care/ADL training;Therapeutic exercise;Energy conservation;Manual therapy;Therapeutic activities;Patient/family education;Balance training    OT Goals(Current goals can be found in the care plan section) Acute Rehab OT Goals Patient Stated Goal: to get back home OT Goal Formulation: With patient Time For Goal Achievement: 12/20/21 Potential to Achieve Goals: Good  OT Frequency: Min 2X/week    Co-evaluation              AM-PAC OT "6 Clicks" Daily Activity     Outcome Measure Help from another person eating meals?: A Little Help from another person taking care of personal grooming?: A Little Help from another person toileting, which includes using toliet, bedpan, or urinal?: A Little Help from another person bathing (including washing, rinsing, drying)?: A Little Help from another person to  put on and taking off regular upper body clothing?: A Little Help from another person to put on and taking off regular lower body clothing?: A Little 6 Click Score: 18   End of Session Equipment Utilized During Treatment: Oxygen Nurse Communication: Mobility status  Activity Tolerance: Patient limited by fatigue;Patient tolerated treatment well Patient left: in bed;with call bell/phone within reach  OT Visit Diagnosis: Unsteadiness on feet (R26.81);Other abnormalities of gait and mobility (R26.89)                Time: 2536-6440 OT Time  Calculation (min): 23 min Charges:  OT General Charges $OT Visit: 1 Visit OT Evaluation $OT Eval Moderate Complexity: 1 Mod  Pollyann Glen E. Palin Tristan, OTR/L Acute Rehabilitation Services [phone]   Cherlyn Cushing 12/06/2021, 10:57 AM

## 2021-12-06 NOTE — Progress Notes (Signed)
?  Echocardiogram ?2D Echocardiogram has been performed. ? ?Augustine Radar ?12/06/2021, 8:59 AM ?

## 2021-12-06 NOTE — Progress Notes (Signed)
OT Cancellation Note ? ?Patient Details ?Name: [name] ?MRN: [id number] ?DOB: 1926/04/06 ? ? ?Cancelled Treatment:    Reason Eval/Treat Not Completed: Patient at procedure or test/ unavailable Patient currently having an ECHO done, will follow up to complete evaluation as time permits. ? ?Pollyann Glen E. Tashae Inda, OTR/L ?Acute Rehabilitation Services ?[phone] ?[phone]  ? ?Pollyann Glen Saliah Crisp ?12/06/2021, 8:37 AM ?

## 2021-12-06 NOTE — TOC Progression Note (Signed)
Transition of Care (TOC) - Progression Note  ? ? ?Patient Details  ?Name: [name] ?MRN: [id number] ?Date of Birth: January 29, 1926 ? ?Transition of Care (TOC) CM/SW Contact  ?[name], RN ?Phone Number: ?12/06/2021, 4:27 PM ? ?Clinical Narrative:    ?Patient is set up with [facility] for HHPT, HHOT,  From home alone, CHF ex, still fluid oerlaod, conts on iv lasix bid, getting breathing txts. Has home oxygen 5 liters- [employer].  TOC will continue to follow for dc needs.  ? ?Expected Discharge Plan: Home w Home Health Services ?Barriers to Discharge: Continued Medical Work up ? ?Expected Discharge Plan and Services ?Expected Discharge Plan: Home w Home Health Services ?  ?Discharge Planning Services: CM Consult ?Post Acute Care Choice: Home Health ?Living arrangements for the past 2 months: Single Family Home ?                ?  ?DME Agency: NA ?  ?  ?  ?HH Arranged: PT, OT ?HH Agency: [facility] ?Date HH Agency Contacted: 12/06/21 ?Time HH Agency Contacted: 1627 ?Representative spoke with at [facility] Agency: Kandee Keen ? ? ?Social Determinants of Health (SDOH) Interventions ?  ? ?Readmission Risk Interventions ?   ? View : No data to display.  ?  ?  ?  ? ? ?

## 2021-12-06 NOTE — TOC Progression Note (Addendum)
Transition of Care (TOC) - Progression Note  ? ? ?Patient Details  ?Name: [name] ?MRN: [id number] ?Date of Birth: Mar 11, 1926 ? ?Transition of Care (TOC) CM/SW Contact  ?[name], RN ?Phone Number: ?12/06/2021, 3:20 PM ? ?Clinical Narrative:    ?From home alone, CHF ex, still fluid oerlaod, conts on iv lasix bid, getting breathing txts. Has home oxygen 5 liters- [employer].  Conts here on HFNC at 10 liters. NCM offered choice, she chose [facility].  NCM made referral to [name] with [facility], Awaiting to hear back. Patient states she has a rollator at home and she has 3 daughters that take turns staying with her.  [name]  states they can not take [employer], patient's second choice is [facility], NCM made referral to [facility] with [name], he is able to take referral.  Soc will begin 24 to 48 hrs post dc.   ? ? ?  ?  ? ?Expected Discharge Plan and Services ?  ?  ?  ?  ?  ?                ?  ?  ?  ?  ?  ?  ?  ?  ?  ?  ? ? ?Social Determinants of Health (SDOH) Interventions ?  ? ?Readmission Risk Interventions ?   ? View : No data to display.  ?  ?  ?  ? ? ?

## 2021-12-07 ENCOUNTER — Encounter (HOSPITAL_COMMUNITY): Payer: Self-pay | Admitting: Internal Medicine

## 2021-12-07 DIAGNOSIS — N1832 Chronic kidney disease, stage 3b: Secondary | ICD-10-CM | POA: Diagnosis not present

## 2021-12-07 DIAGNOSIS — J449 Chronic obstructive pulmonary disease, unspecified: Secondary | ICD-10-CM | POA: Diagnosis not present

## 2021-12-07 DIAGNOSIS — J9621 Acute and chronic respiratory failure with hypoxia: Secondary | ICD-10-CM | POA: Diagnosis not present

## 2021-12-07 DIAGNOSIS — I5033 Acute on chronic diastolic (congestive) heart failure: Secondary | ICD-10-CM | POA: Diagnosis not present

## 2021-12-07 LAB — GLUCOSE, CAPILLARY
Glucose-Capillary: 147 mg/dL — ABNORMAL HIGH (ref 70–99)
Glucose-Capillary: 209 mg/dL — ABNORMAL HIGH (ref 70–99)
Glucose-Capillary: 317 mg/dL — ABNORMAL HIGH (ref 70–99)
Glucose-Capillary: 213 mg/dL — ABNORMAL HIGH (ref 70–99)
Glucose-Capillary: 158 mg/dL — ABNORMAL HIGH (ref 70–99)

## 2021-12-07 LAB — BASIC METABOLIC PANEL
Anion gap: 11 (ref 5–15)
BUN: 46 mg/dL — ABNORMAL HIGH (ref 8–23)
CO2: 24 mmol/L (ref 22–32)
Calcium: 8.9 mg/dL (ref 8.9–10.3)
Chloride: 97 mmol/L — ABNORMAL LOW (ref 98–111)
Creatinine, Ser: 1.72 mg/dL — ABNORMAL HIGH (ref 0.44–1.00)
GFR, Estimated: 27 mL/min — ABNORMAL LOW (ref >60)
Glucose, Bld: 131 mg/dL — ABNORMAL HIGH (ref 70–99)
Potassium: 4.1 mmol/L (ref 3.5–5.1)
Sodium: 132 mmol/L — ABNORMAL LOW (ref 135–145)

## 2021-12-07 MED ORDER — DILTIAZEM HCL ER COATED BEADS 240 MG PO CP24: 240.0000 mg | ORAL_CAPSULE | Freq: Every day | ORAL | Status: DC

## 2021-12-07 MED ORDER — INSULIN GLARGINE-YFGN 100 UNIT/ML ~~LOC~~ SOLN
10.0000 [IU] | Freq: Every day | SUBCUTANEOUS | Status: DC
  Administered 2021-12-08: 10 [IU] via SUBCUTANEOUS
  Filled 2021-12-07 (×2): qty 0.1

## 2021-12-07 MED ORDER — FUROSEMIDE 10 MG/ML IJ SOLN
60.0000 mg | Freq: Two times a day (BID) | INTRAMUSCULAR | Status: DC
Start: 2021-12-07 — End: 2021-12-08
  Administered 2021-12-07 (×2): 60 mg via INTRAVENOUS
  Filled 2021-12-07 (×2): qty 6

## 2021-12-07 NOTE — Progress Notes (Addendum)
?      ?                 PROGRESS NOTE ? ?      ?PATIENT DETAILS ?Name: [name] ?Age: 88 y.o. ?Sex: female ?Date of Birth: 1925-11-08 ?Admit Date: 11/30/2021 ?Admitting Physician Lenore Cordia, MD ?MPN:TIRWER, Shanon Brow, MD ? ?Brief Summary: ?[name] is a 88 y.o. female with medical history significant for chronic respiratory failure with hypoxia on 5 L O2 via , COPD with pulmonary fibrosis, HFpEF (EF 55-60%), pulmonary hypertension, HTN who presented with shortness of breath-found to have acute on chronic hypoxic respiratory failure due to HFpEF exacerbation.  ? ?Significant events: ?4/15>> presented to ED with shortness of breath-initially required BiPAP-found to have acute on chronic hypoxic respiratory failure due to probable HFpEF exacerbation. ? ?Significant studies: ?4/15>> CXR: Pulmonary edema superimposed on chronic bibasilar scarring/fibrosis. ?4/17>> CXR: Increased moderate bilateral pleural effusions, with likely atelectasis.  Mild pulm edema. ?4/17>> ESR: 1 ?4/17>> procalcitonin:<0.10 ?4/17>> A1c:8.5 ?4/17>> Echo: EF 15%, RV systolic function is severely reduced.  RVSP of 73.7. ? ?Significant microbiology data: ?4/15>> COVID/influenza PCR: Negative ? ?Procedures: ?None ? ?Consults: ?None  ? ?Subjective: ?Feels better-still with significant lower extremity edema.  Stable on 5 L of HFNC at rest. ? ?Objective: ?Vitals: ?Blood pressure 118/75, pulse (!) 57, temperature (!) 97.5 ?F (36.4 ?C), temperature source Oral, resp. rate (!) 24, height $RemoveBe'5\' 3"'eVSbRUzww$  (1.6 m), weight 82.2 kg, SpO2 90 %.  ? ?Exam: ?Gen Exam:Alert awake-not in any distress ?HEENT:atraumatic, normocephalic ?Chest: Bibasilar rales ?CVS:S1S2 regular ?Abdomen:soft non tender, non distended ?Extremities:2+ edema ?Neurology: Non focal ?Skin: no rash  ? ?Pertinent Labs/Radiology: ? ?  Latest Ref Rng & Units 12/06/2021  ?  2:19 AM 12/05/2021  ?  4:51 AM 12/13/2021  ? 11:18 PM  ?CBC  ?WBC 4.0 - 10.5 K/uL 8.6   6.6     ?Hemoglobin 12.0 - 15.0 g/dL  16.4   18.0   18.4    ?Hematocrit 36.0 - 46.0 % 49.3   52.9   54.0    ?Platelets 150 - 400 K/uL 180   176     ?  ?Lab Results  ?Component Value Date  ? NA 132 (L) 12/07/2021  ? K 4.1 12/07/2021  ? CL 97 (L) 12/07/2021  ? CO2 24 12/07/2021  ? ?  ? ? ?Assessment/Plan: ?Acute on chronic hypoxic respiratory failure due to HFpEF exacerbation and severe RV failure: Remains volume overloaded-hypoxia stable on 5 L of HFNC.  Creatinine stable overnight-increase furosemide to 60 mg IV twice daily-continue current dosing of Aldactone.  See below regarding palliative care. ? ?History of pulmonary fibrosis-with chronic hypoxic respiratory failure (on 4 L of oxygen at rest-5 L with ambulation)-with acute on chronic corpulmonale/severe pulmonary hypertension : Supportive care-diuretics as above-getting palliative care involved at this point. ? ?COPD: Continue bronchodilators ? ?AKI CKD stage IIIb: Slight bump in creatinine compared to baseline but stable overnight-given ongoing volume overload issues-increase furosemide to 60 mg twice daily.  Follow closely  ? ?HTN: BP soft but stable-continue Lopressor-hold Cardizem to allow some room for diuresis.  Follow and adjust. ? ?DM-2 (A1c 8.5 on 4/17): not a new diagnosis-CBGs remain on the higher side with SSI-adding low-dose Semglee.  Reassess on 4/19.  ? ? ?Recent Labs  ?  12/06/21 ?2328 12/07/21 ?0356 12/07/21 ?0808  ?GLUCAP 205* 147* 209*  ?  ? ?Palliative care: Unfortunate 88 year old with history of pulmonary fibrosis-known history of pulmonary hypertension-with massive  volume overload due to combination of HFpEF exacerbation and worsening RV failure.  She does not desire aggressive care-do not think she will benefit advanced heart failure therapies.  We will try and diuresis much as possible-but she is agreeable to have a palliative care discussion with the palliative care team.  Overall prognosis is poor-suspect best benefits from initiation of hospice care-and home hospice on  discharge.  Both patient/daughter agreeable with this plan. ? ?Obesity: ?Estimated body mass index is 32.12 kg/m? as calculated from the following: ?  Height as of this encounter: $RemoveBeforeD'5\' 3"'YjfiUXdSVwCNcE$  (1.6 m). ?  Weight as of this encounter: 82.2 kg.  ? ?Code status: ?  Code Status: DNR  ? ?DVT Prophylaxis: ?enoxaparin (LOVENOX) injection 30 mg Start: 12/05/21 1400 ?  ?Family Communication: Daughter -[phone]-updated on 4/18.  ? ? ?Disposition Plan: ?Status is: Inpatient ?Remains inpatient appropriate because: Severe hypoxia-significant volume overload-on IV furosemide-on higher dosing of oxygen than usual baseline-not yet stable for discharge. ?  ?Planned Discharge Destination:Home ? ? ?Diet: ?Diet Order   ? ?       ?  Diet Heart Room service appropriate? Yes; Fluid consistency: Thin  Diet effective now       ?  ? ?  ?  ? ?  ?  ? ? ?Antimicrobial agents: ?Anti-infectives (From admission, onward)  ? ? None  ? ?  ? ? ? ?MEDICATIONS: ?Scheduled Meds: ? arformoterol  15 mcg Nebulization BID  ? budesonide (PULMICORT) nebulizer solution  0.25 mg Nebulization BID  ? enoxaparin (LOVENOX) injection  30 mg Subcutaneous Q24H  ? furosemide  60 mg Intravenous BID  ? insulin aspart  0-9 Units Subcutaneous Q4H  ? metoprolol tartrate  12.5 mg Oral Daily  ? revefenacin  175 mcg Nebulization Daily  ? sodium chloride flush  3 mL Intravenous Q12H  ? spironolactone  25 mg Oral Daily  ? ?Continuous Infusions: ?PRN Meds:.acetaminophen **OR** acetaminophen, ipratropium-albuterol, ondansetron **OR** ondansetron (ZOFRAN) IV ? ? ?I have personally reviewed following labs and imaging studies ? ?LABORATORY DATA: ?CBC: ?Recent Labs  ?Lab 12/08/2021 ?2247 12/03/2021 ?2318 12/05/21 ?0451 12/06/21 ?5537  ?WBC 7.0  --  6.6 8.6  ?NEUTROABS 5.6  --   --   --   ?HGB 17.5* 18.4* 18.0* 16.4*  ?HCT 52.5* 54.0* 52.9* 49.3*  ?MCV 96.7  --  95.3 95.4  ?PLT 182  --  176 180  ? ? ? ?Basic Metabolic Panel: ?Recent Labs  ?Lab 12/13/2021 ?2247 12/07/2021 ?2318 12/05/21 ?0451  12/06/21 ?0219 12/07/21 ?0130  ?NA 136 138 138 134* 132*  ?K 4.5 4.5 4.4 5.1 4.1  ?CL 101  --  102 102 97*  ?CO2 25  --  $R'24 22 24  'Sx$ ?GLUCOSE 179*  --  201* 298* 131*  ?BUN 27*  --  27* 39* 46*  ?CREATININE 1.39*  --  1.33* 1.72* 1.72*  ?CALCIUM 9.4  --  9.3 9.0 8.9  ?MG  --   --  2.4  --   --   ? ? ? ?GFR: ?Estimated Creatinine Clearance: 20.7 mL/min (A) (by C-G formula based on SCr of 1.72 mg/dL (H)). ? ?Liver Function Tests: ?No results for input(s): AST, ALT, ALKPHOS, BILITOT, PROT, ALBUMIN in the last 168 hours. ?No results for input(s): LIPASE, AMYLASE in the last 168 hours. ?No results for input(s): AMMONIA in the last 168 hours. ? ?Coagulation Profile: ?No results for input(s): INR, PROTIME in the last 168 hours. ? ?Cardiac Enzymes: ?No results for input(s): CKTOTAL, CKMB,  CKMBINDEX, TROPONINI in the last 168 hours. ? ?BNP (last 3 results) ?No results for input(s): PROBNP in the last 8760 hours. ? ?Lipid Profile: ?No results for input(s): CHOL, HDL, LDLCALC, TRIG, CHOLHDL, LDLDIRECT in the last 72 hours. ? ?Thyroid Function Tests: ?No results for input(s): TSH, T4TOTAL, FREET4, T3FREE, THYROIDAB in the last 72 hours. ? ?Anemia Panel: ?No results for input(s): VITAMINB12, FOLATE, FERRITIN, TIBC, IRON, RETICCTPCT in the last 72 hours. ? ?Urine analysis: ?No results found for: COLORURINE, APPEARANCEUR, [place], [place], [place], [place], [place], KETONESUR, PROTEINUR, [place], NITRITE, LEUKOCYTESUR ? ?Sepsis Labs: ?Lactic Acid, Venous ?No results found for: LATICACIDVEN ? ?MICROBIOLOGY: ?Recent Results (from the past 240 hour(s))  ?Resp Panel by RT-PCR (Flu A&B, Covid) Nasopharyngeal Swab     Status: None  ? Collection Time: 12/05/21  1:18 AM  ? Specimen: Nasopharyngeal Swab; Nasopharyngeal(NP) swabs in vial transport medium  ?Result Value Ref Range Status  ? SARS Coronavirus 2 by RT PCR NEGATIVE NEGATIVE Final  ?  Comment: (NOTE) ?SARS-CoV-2 target nucleic acids are NOT DETECTED. ? ?The SARS-CoV-2 RNA is  generally detectable in upper respiratory ?specimens during the acute phase of infection. The lowest ?concentration of SARS-CoV-2 viral copies this assay can detect is ?138 copies/mL. A negative result

## 2021-12-07 NOTE — Progress Notes (Signed)
FSBG was taken while patient was eating. Recorded as 209 and sliding scale insulin was administered as ordered. ?

## 2021-12-07 NOTE — Progress Notes (Signed)
Mobility Specialist Progress Note: ? ? 12/07/21 1330  ?Mobility  ?Activity Ambulated with assistance in room  ?Level of Assistance Minimal assist, patient does 75% or more  ?Assistive Device Front wheel walker  ?Distance Ambulated (ft) 25 ft  ?Activity Response Tolerated fair  ?$Mobility charge 1 Mobility  ? ?Pt agreeable to mobility session this afternoon. Required minA to stand from EOB, minG during ambulation. Distance limited d/t fatigue. VSS on 5LO2. Pt left sitting in chair with all needs met.  ? ?Bana Borgmeyer ?Acute Rehab ?Phone: [phone] ?Office Phone: [phone] ? ?

## 2021-12-08 ENCOUNTER — Encounter (HOSPITAL_COMMUNITY): Payer: Self-pay | Admitting: Internal Medicine

## 2021-12-08 DIAGNOSIS — Z7189 Other specified counseling: Secondary | ICD-10-CM

## 2021-12-08 DIAGNOSIS — J810 Acute pulmonary edema: Secondary | ICD-10-CM

## 2021-12-08 DIAGNOSIS — I5033 Acute on chronic diastolic (congestive) heart failure: Secondary | ICD-10-CM | POA: Diagnosis not present

## 2021-12-08 DIAGNOSIS — J9621 Acute and chronic respiratory failure with hypoxia: Secondary | ICD-10-CM | POA: Diagnosis not present

## 2021-12-08 DIAGNOSIS — J449 Chronic obstructive pulmonary disease, unspecified: Secondary | ICD-10-CM | POA: Diagnosis not present

## 2021-12-08 DIAGNOSIS — N1832 Chronic kidney disease, stage 3b: Secondary | ICD-10-CM | POA: Diagnosis not present

## 2021-12-08 DIAGNOSIS — Z515 Encounter for palliative care: Secondary | ICD-10-CM

## 2021-12-08 DIAGNOSIS — R0600 Dyspnea, unspecified: Secondary | ICD-10-CM

## 2021-12-08 LAB — BASIC METABOLIC PANEL
Anion gap: 8 (ref 5–15)
BUN: 52 mg/dL — ABNORMAL HIGH (ref 8–23)
CO2: 26 mmol/L (ref 22–32)
Calcium: 8.4 mg/dL — ABNORMAL LOW (ref 8.9–10.3)
Chloride: 98 mmol/L (ref 98–111)
Creatinine, Ser: 1.58 mg/dL — ABNORMAL HIGH (ref 0.44–1.00)
GFR, Estimated: 30 mL/min — ABNORMAL LOW (ref >60)
Glucose, Bld: 115 mg/dL — ABNORMAL HIGH (ref 70–99)
Potassium: 4.1 mmol/L (ref 3.5–5.1)
Sodium: 132 mmol/L — ABNORMAL LOW (ref 135–145)

## 2021-12-08 LAB — GLUCOSE, CAPILLARY
Glucose-Capillary: 125 mg/dL — ABNORMAL HIGH (ref 70–99)
Glucose-Capillary: 127 mg/dL — ABNORMAL HIGH (ref 70–99)
Glucose-Capillary: 179 mg/dL — ABNORMAL HIGH (ref 70–99)
Glucose-Capillary: 216 mg/dL — ABNORMAL HIGH (ref 70–99)
Glucose-Capillary: 219 mg/dL — ABNORMAL HIGH (ref 70–99)

## 2021-12-08 MED ORDER — FUROSEMIDE 10 MG/ML IJ SOLN
80.0000 mg | Freq: Two times a day (BID) | INTRAMUSCULAR | Status: DC
  Administered 2021-12-08: 80 mg via INTRAVENOUS
  Filled 2021-12-08 (×2): qty 8

## 2021-12-08 MED ORDER — BIOTENE DRY MOUTH MT LIQD: 15.0000 mL | OROMUCOSAL | Status: DC | PRN

## 2021-12-08 MED ORDER — LORAZEPAM 1 MG PO TABS: 1.0000 mg | ORAL_TABLET | ORAL | Status: DC | PRN

## 2021-12-08 MED ORDER — GLYCOPYRROLATE 0.2 MG/ML IJ SOLN: 0.2000 mg | INTRAMUSCULAR | Status: DC | PRN

## 2021-12-08 MED ORDER — GLYCOPYRROLATE 1 MG PO TABS
1.0000 mg | ORAL_TABLET | ORAL | Status: DC | PRN
  Filled 2021-12-08: qty 1

## 2021-12-08 MED ORDER — POLYVINYL ALCOHOL 1.4 % OP SOLN
1.0000 [drp] | Freq: Four times a day (QID) | OPHTHALMIC | Status: DC | PRN
  Filled 2021-12-08: qty 15

## 2021-12-08 MED ORDER — GLYCOPYRROLATE 0.2 MG/ML IJ SOLN
0.2000 mg | INTRAMUSCULAR | Status: DC | PRN
Start: 2021-12-08 — End: 2021-12-09

## 2021-12-08 MED ORDER — LORAZEPAM 2 MG/ML PO CONC: 1.0000 mg | ORAL | Status: DC | PRN

## 2021-12-08 MED ORDER — LORAZEPAM 2 MG/ML IJ SOLN
1.0000 mg | INTRAMUSCULAR | Status: DC | PRN
  Administered 2021-12-09: 1 mg via INTRAVENOUS
  Filled 2021-12-08: qty 1

## 2021-12-08 MED ORDER — HYDROMORPHONE BOLUS VIA INFUSION
0.5000 mg | INTRAVENOUS | Status: DC | PRN
  Filled 2021-12-08: qty 1

## 2021-12-08 MED ORDER — SODIUM CHLORIDE 0.9 % IV SOLN
0.5000 mg/h | INTRAVENOUS | Status: DC
  Administered 2021-12-08: 0.5 mg/h via INTRAVENOUS
  Filled 2021-12-08: qty 5

## 2021-12-08 NOTE — Progress Notes (Signed)
Verified through teachback patient understanding of comfort measures and implications for end of life. Patient expresses understanding, stating that she is "ready to go" citing factors in her life which she felt were fulfilling and expressing contentment. Educated patient on each comfort medication ordered, including dilaudid drip. Patient expresses understanding. Patient states that she does not want her daughters contacted on initiation of dilaudid drip but has provided this RN with final message in the event of incapacitation or expiration before morning. ? ?Dilaudid drip initiated. Monitor adjusted to reflect comfort care. Will continue to assess need for comfort medication administration. ?

## 2021-12-08 NOTE — Progress Notes (Signed)
Physical Therapy Treatment ?Patient Details ?Name: [name] ?MRN: [id number] ?DOB: 10-01-1925 ?Today's Date: 12/08/2021 ? ? ?History of Present Illness Patient is a 88 yo female presenting to the ED with SOB and increased edema in upper and lower extremities on 11/22/2021. Admitted with respiratory failure. PMH includes: CHF, COPD, pulmonary fibrosis, HFpEF (EF 55-60%), pulmonary hypertension, HTN, COVID, uses O2 at home ? ?  ?PT Comments  ? ? Focused session on progressing pt's endurance/activity tolerance through performing x3 brief standing bouts with activities. Pt was able to ambulate ~32 ft in the room with the rollator one bout, perform standing squats 2nd bout, and perform standing hip abduction AROM 3rd bout while on 4-5L of supplemental O2. Pt's SpO2 was not reading accurately during session, thus adjusted activity to pt's tolerance. Will continue to follow acutely. Current recommendations remain appropriate. ? ?   ?Recommendations for follow up therapy are one component of a multi-disciplinary discharge planning process, led by the attending physician.  Recommendations may be updated based on patient status, additional functional criteria and insurance authorization. ? ?Follow Up Recommendations ? Home health PT ?  ?  ?Assistance Recommended at Discharge Intermittent Supervision/Assistance  ?Patient can return home with the following A little help with walking and/or transfers;A little help with bathing/dressing/bathroom;Assistance with cooking/housework;Assist for transportation;Help with stairs or ramp for entrance ?  ?Equipment Recommendations ? None recommended by PT  ?  ?Recommendations for Other Services   ? ? ?  ?Precautions / Restrictions Precautions ?Precautions: Fall;Other (comment) ?Precaution Comments: monitor O2 sats ?Restrictions ?Weight Bearing Restrictions: No  ?  ? ?Mobility ? Bed Mobility ?Overal bed mobility: Modified Independent ?  ?  ?  ?  ?  ?  ?General bed mobility comments: Able to  transition supine > sit EOB with HOB elevated without assistance. ?  ? ?Transfers ?Overall transfer level: Needs assistance ?Equipment used: Rollator (4 wheels) ?Transfers: Sit to/from Stand, Bed to chair/wheelchair/BSC ?Sit to Stand: Supervision ?  ?Step pivot transfers: Supervision ?  ?  ?  ?General transfer comment: Supervision for safety, no LOB ?  ? ?Ambulation/Gait ?Ambulation/Gait assistance: Min guard ?Gait Distance (Feet): 32 Feet ?Assistive device: Rollator (4 wheels) ?Gait Pattern/deviations: Step-through pattern, Decreased stride length, Trunk flexed ?Gait velocity: reduced ?Gait velocity interpretation: 1.31 - 2.62 ft/sec, indicative of limited community ambulator ?  ?General Gait Details: Pt with flexed posture and short steps steppin forward and backward in room to pt tolerance. No LOB, min guard for safety. ? ? ?Stairs ?  ?  ?  ?  ?  ? ? ?Wheelchair Mobility ?  ? ?Modified Rankin (Stroke Patients Only) ?  ? ? ?  ?Balance Overall balance assessment: Needs assistance ?Sitting-balance support: No upper extremity supported, Feet supported ?Sitting balance-Leahy Scale: Good ?  ?  ?Standing balance support: Bilateral upper extremity supported, During functional activity, Reliant on assistive device for balance ?Standing balance-Leahy Scale: Poor ?Standing balance comment: Reliant on rollator for gait ?  ?  ?  ?  ?  ?  ?  ?  ?  ?  ?  ?  ? ?  ?Cognition Arousal/Alertness: Awake/alert ?Behavior During Therapy: [facility] for tasks assessed/performed ?Overall Cognitive Status: Within Functional Limits for tasks assessed ?  ?  ?  ?  ?  ?  ?  ?  ?  ?  ?  ?  ?  ?  ?  ?  ?  ?  ?  ? ?  ?Exercises General Exercises - Lower Extremity ?Hip  ABduction/ADduction: AROM, Strengthening, Both, 10 reps, Standing (with walker) ?Mini-Sqauts: AROM, Strengthening, 20 reps, Standing (with walker) ? ?  ?General Comments General comments (skin integrity, edema, etc.): SpO2 with poor waveforms, reading 70-80s at times and then 90s% with  good waveforms on 4-6L O2 ?  ?  ? ?Pertinent Vitals/Pain Pain Assessment ?Pain Assessment: Faces ?Faces Pain Scale: No hurt ?Pain Intervention(s): Monitored during session  ? ? ?Home Living   ?  ?  ?  ?  ?  ?  ?  ?  ?  ?   ?  ?Prior Function    ?  ?  ?   ? ?PT Goals (current goals can now be found in the care plan section) Acute Rehab PT Goals ?Patient Stated Goal: to improve ?PT Goal Formulation: With patient/family ?Time For Goal Achievement: 12/20/21 ?Potential to Achieve Goals: Good ?Progress towards PT goals: Progressing toward goals ? ?  ?Frequency ? ? ? Min 3X/week ? ? ? ?  ?PT Plan Current plan remains appropriate  ? ? ?Co-evaluation   ?  ?  ?  ?  ? ?  ?AM-PAC PT "6 Clicks" Mobility   ?Outcome Measure ? Help needed turning from your back to your side while in a flat bed without using bedrails?: None ?Help needed moving from lying on your back to sitting on the side of a flat bed without using bedrails?: None ?Help needed moving to and from a bed to a chair (including a wheelchair)?: A Little ?Help needed standing up from a chair using your arms (e.g., wheelchair or bedside chair)?: A Little ?Help needed to walk in hospital room?: A Little ?Help needed climbing 3-5 steps with a railing? : A Lot ?6 Click Score: 19 ? ?  ?End of Session Equipment Utilized During Treatment: Oxygen ?Activity Tolerance: Patient tolerated treatment well ?Patient left: in chair;with call bell/phone within reach;with chair alarm set;with family/visitor present ?  ?PT Visit Diagnosis: Other abnormalities of gait and mobility (R26.89);Unsteadiness on feet (R26.81);Muscle weakness (generalized) (M62.81);Difficulty in walking, not elsewhere classified (R26.2) ?  ? ? ?Time: 5726-2035 ?PT Time Calculation (min) (ACUTE ONLY): 20 min ? ?Charges:  $Therapeutic Exercise: 8-22 mins          ?          ? ?[name], PT, DPT ?Acute Rehabilitation Services  ?Pager: [phone] ?Office: [phone] ? ? ? ?[name] ?12/08/2021, 11:04  AM ? ?

## 2021-12-08 NOTE — Progress Notes (Signed)
Occupational Therapy Treatment ?Patient Details ?Name: [name] ?MRN: [id number] ?DOB: 11-23-1925 ?Today's Date: 12/08/2021 ? ? ?History of present illness Patient is a 88 yo female presenting to the ED with SOB and increased edema in upper and lower extremities on 11/23/2021. Admitted with respiratory failure. PMH includes: CHF, COPD, pulmonary fibrosis, HFpEF (EF 55-60%), pulmonary hypertension, HTN, COVID, uses O2 at home ?  ?OT comments ? Patient continues to make steady progress towards goals in skilled OT session. Patient's session encompassed  sit<>stand  x1 from recliner, and grooming ADLs in sitting. Patient reporting fatigue after PT session this morning, politely declining functional ambulation with OT. Patient able to complete sit<>stand at min guard level, and set up for ADLs. Discharge remains appropriate, OT will continue to follow acutely.   ? ?Recommendations for follow up therapy are one component of a multi-disciplinary discharge planning process, led by the attending physician.  Recommendations may be updated based on patient status, additional functional criteria and insurance authorization. ?   ?Follow Up Recommendations ? Home health OT  ?  ?Assistance Recommended at Discharge Intermittent Supervision/Assistance  ?Patient can return home with the following ? A little help with walking and/or transfers;A little help with bathing/dressing/bathroom;Assistance with cooking/housework;Assist for transportation;Help with stairs or ramp for entrance ?  ?Equipment Recommendations ? None recommended by OT (Patient has DME needed)  ?  ?Recommendations for Other Services   ? ?  ?Precautions / Restrictions Precautions ?Precautions: Fall;Other (comment) ?Precaution Comments: monitor O2 sats ?Restrictions ?Weight Bearing Restrictions: No  ? ? ?  ? ?Mobility Bed Mobility ?  ?  ?  ?  ?  ?  ?  ?General bed mobility comments: Up in chair upon OT arrival ?  ? ?Transfers ?Overall transfer level: Needs  assistance ?Equipment used: Rollator (4 wheels) ?Transfers: Sit to/from Stand ?Sit to Stand: Min guard ?  ?  ?  ?  ?  ?General transfer comment: min gaurd from chair ?  ?  ?Balance Overall balance assessment: Needs assistance ?Sitting-balance support: No upper extremity supported, Feet supported ?Sitting balance-Leahy Scale: Good ?  ?  ?Standing balance support: Bilateral upper extremity supported, During functional activity, Reliant on assistive device for balance ?Standing balance-Leahy Scale: Poor ?  ?  ?  ?  ?  ?  ?  ?  ?  ?  ?  ?  ?   ? ?ADL either performed or assessed with clinical judgement  ? ?ADL Overall ADL's : Needs assistance/impaired ?  ?  ?Grooming: Set up;Sitting;Oral care;Wash/dry face;Wash/dry hands ?  ?  ?  ?  ?  ?  ?  ?  ?  ?Toilet Transfer: Minimal assistance;Rolling walker (2 wheels) ?  ?  ?  ?  ?  ?Functional mobility during ADLs: Moderate assistance;Cueing for sequencing;Cueing for safety;Rolling walker (2 wheels) ?General ADL Comments: Patient making progress towards goals ?  ? ?Extremity/Trunk Assessment   ?  ?  ?  ?  ?  ? ?Vision   ?  ?  ?Perception   ?  ?Praxis   ?  ? ?Cognition Arousal/Alertness: Awake/alert ?Behavior During Therapy: [facility] for tasks assessed/performed ?Overall Cognitive Status: Within Functional Limits for tasks assessed ?  ?  ?  ?  ?  ?  ?  ?  ?  ?  ?  ?  ?  ?  ?  ?  ?  ?  ?  ?   ?Exercises   ? ?  ?Shoulder Instructions   ? ? ?  ?  General Comments SpO2 with poor waveforms, reading 70-80s at times and then 90s% with good waveforms on 4-6L O2  ? ? ?Pertinent Vitals/ Pain       Pain Assessment ?Pain Assessment: No/denies pain ? ?Home Living   ?  ?  ?  ?  ?  ?  ?  ?  ?  ?  ?  ?  ?  ?  ?  ?  ?  ?  ? ?  ?Prior Functioning/Environment    ?  ?  ?  ?   ? ?Frequency ? Min 2X/week  ? ? ? ? ?  ?Progress Toward Goals ? ?OT Goals(current goals can now be found in the care plan section) ? Progress towards OT goals: Progressing toward goals ? ?Acute Rehab OT Goals ?Patient Stated Goal: to  get back home ?OT Goal Formulation: With patient ?Time For Goal Achievement: 12/20/21 ?Potential to Achieve Goals: Good  ?Plan Discharge plan remains appropriate   ? ?Co-evaluation ? ? ?   ?  ?  ?  ?  ? ?  ?AM-PAC OT "6 Clicks" Daily Activity     ?Outcome Measure ? ? Help from another person eating meals?: A Little ?Help from another person taking care of personal grooming?: A Little ?Help from another person toileting, which includes using toliet, bedpan, or urinal?: A Little ?Help from another person bathing (including washing, rinsing, drying)?: A Little ?Help from another person to put on and taking off regular upper body clothing?: A Little ?Help from another person to put on and taking off regular lower body clothing?: A Little ?6 Click Score: 18 ? ?  ?End of Session Equipment Utilized During Treatment: Oxygen ? ?OT Visit Diagnosis: Unsteadiness on feet (R26.81);Other abnormalities of gait and mobility (R26.89) ?  ?Activity Tolerance Patient limited by fatigue;Patient tolerated treatment well ?  ?Patient Left in chair;with call bell/phone within reach;with chair alarm set ?  ?Nurse Communication Mobility status ?  ? ?   ? ?Time: 3086-5784 ?OT Time Calculation (min): 17 min ? ?Charges: OT General Charges ?$OT Visit: 1 Visit ?OT Treatments ?$Self Care/Home Management : 8-22 mins ? ?Pollyann Glen E. Lott Seelbach, OTR/L ?Acute Rehabilitation Services ?[phone] ?[phone]  ? ?Pollyann Glen Xylina Rhoads ?12/08/2021, 12:13 PM ?

## 2021-12-08 NOTE — Progress Notes (Signed)
Patient is in the chair alert and oriented x 4. Denies pain and is on her cell phone texting. She has no complaints or concerns at this time. 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 ?

## 2021-12-08 NOTE — Progress Notes (Addendum)
?      ?                 PROGRESS NOTE ? ?      ?PATIENT DETAILS ?Name: [name] ?Age: 88 y.o. ?Sex: female ?Date of Birth: Apr 17, 1926 ?Admit Date: 11/28/2021 ?Admitting Physician Lenore Cordia, MD ?EXH:[id number], Shanon Brow, MD ? ?Brief Summary: ?[name] is a 88 y.o. female with medical history significant for chronic respiratory failure with hypoxia on 5 L O2 via [place], COPD with pulmonary fibrosis, HFpEF (EF 55-60%), pulmonary hypertension, HTN who presented with shortness of breath-found to have acute on chronic hypoxic respiratory failure due to HFpEF exacerbation.  ? ?Significant events: ?4/15>> presented to ED with shortness of breath-initially required BiPAP-found to have acute on chronic hypoxic respiratory failure due to probable HFpEF exacerbation. ? ?Significant studies: ?4/15>> CXR: Pulmonary edema superimposed on chronic bibasilar scarring/fibrosis. ?4/17>> CXR: Increased moderate bilateral pleural effusions, with likely atelectasis.  Mild pulm edema. ?4/17>> ESR: 1 ?4/17>> procalcitonin:<0.10 ?4/17>> A1c:8.5 ?4/17>> Echo: EF 78%, RV systolic function is severely reduced.  RVSP of 73.7. ? ?Significant microbiology data: ?4/15>> COVID/influenza PCR: Negative ? ?Procedures: ?None ? ?Consults: ?None  ? ?Subjective: ?Still with significant volume overload.  Acknowledges severe exertional dyspnea with minimal movement-even walking to the bathroom is a struggle.  Inquiring about [facility] placement.  Daughter at bedside-she is not sure if she can take care of her at home. ? ?Objective: ?Vitals: ?Blood pressure 117/74, pulse 77, temperature 97.8 ?F (36.6 ?C), temperature source Oral, resp. rate (!) 21, height 5' 3" (1.6 m), weight 82.1 kg, SpO2 96 %.  ? ?Exam: ?Gen Exam:Alert awake-not in any distress ?HEENT:atraumatic, normocephalic ?Chest: Bibasilar rales. ?CVS:S1S2 regular ?Abdomen:soft non tender, non distended ?Extremities:+++ edema ?Neurology: Non focal ?Skin: no rash  ? ?Pertinent Labs/Radiology: ? ?   Latest Ref Rng & Units 12/06/2021  ?  2:19 AM 12/05/2021  ?  4:51 AM 11/30/2021  ? 11:18 PM  ?CBC  ?WBC 4.0 - 10.5 K/uL 8.6   6.6     ?Hemoglobin 12.0 - 15.0 g/dL 16.4   18.0   18.4    ?Hematocrit 36.0 - 46.0 % 49.3   52.9   54.0    ?Platelets 150 - 400 K/uL 180   176     ?  ?Lab Results  ?Component Value Date  ? NA 132 (L) 12/08/2021  ? K 4.1 12/08/2021  ? CL 98 12/08/2021  ? CO2 26 12/08/2021  ? ?  ? ? ?Assessment/Plan: ?Acute on chronic hypoxic respiratory failure due to HFpEF exacerbation and severe RV failure: Remains volume overloaded-hypoxia stable on 4-5 L of oxygen at rest.  At rest she is not dyspneic-but gets profoundly dyspneic with minimal movement.  As noted in my prior notes-patient does not desire aggressive care-she is leaning to pursuing full comfort measures-and was inquiring about whether she would be a candidate for [facility] (her husband was in [place]).  Awaiting palliative care evaluation-in the interim-she is agreeable to see if further diuresis will improve some of her symptoms-increase furosemide to 80 mg twice daily-continue Aldactone.  Awaiting further input from palliative care.   ? ?History of pulmonary fibrosis-with chronic hypoxic respiratory failure (on 4 L of oxygen at rest-5 L with ambulation)-with acute on chronic corpulmonale/severe pulmonary hypertension : Supportive care-diuretics as above-awaiting palliative care evaluation. ? ?COPD: Continue bronchodilators ? ?AKI CKD stage IIIb: Creatinine relatively stable compared to her baseline-continue diuretics to see if further diuresis will improve  her symptomatology.  See above.   ? ?HTN: BP BP stable-continue furosemide and Lopressor.  Cardizem held to allow more room in her blood pressure for diuresis.  ? ?DM-2 (A1c 8.5 on 4/17): not a new diagnosis-CBG stable-continue SSI-not a candidate for aggressive glycemic control. ? ? ?Recent Labs  ?  12/08/21 ?0419 12/08/21 ?0818 12/08/21 ?1119  ?GLUCAP 127* 179* 216*  ? ?   ? ?Palliative care: DNR in place-remains profoundly volume overloaded due to severe right-sided heart failure.  She is not a candidate for aggressive heart failure therapies-nor does she desire any such aggressive care.  Her husband passed away in [facility]-and she/daughter is inquiring whether she will qualify.  Unclear at this time if she will need criteria for residential hospice-she is relatively awake alert-and is still eating.  I have asked palliative care to evaluate-in the meantime-we will try and continue diuresis to see if this will improve some of her symptomatology.  Overall prognosis is poor-family is very understanding-await input from palliative care.   ? ?Obesity: ?Estimated body mass index is 32.06 kg/m? as calculated from the following: ?  Height as of this encounter: 5' 3" (1.6 m). ?  Weight as of this encounter: 82.1 kg.  ? ?Code status: ?  Code Status: DNR  ? ?DVT Prophylaxis: ?enoxaparin (LOVENOX) injection 30 mg Start: 12/05/21 1400 ?  ?Family Communication: Daughter -[phone]-updated on 4/19 ? ? ?Disposition Plan: ?Status is: Inpatient ?Remains inpatient appropriate because: Severe hypoxia-significant volume overload-on IV furosemide-on higher dosing of oxygen than usual baseline-not yet stable for discharge. ?  ?Planned Discharge Destination:Home ? ? ?Diet: ?Diet Order   ? ?       ?  Diet Heart Room service appropriate? Yes; Fluid consistency: Thin  Diet effective now       ?  ? ?  ?  ? ?  ?  ? ? ?Antimicrobial agents: ?Anti-infectives (From admission, onward)  ? ? None  ? ?  ? ? ? ?MEDICATIONS: ?Scheduled Meds: ? arformoterol  15 mcg Nebulization BID  ? budesonide (PULMICORT) nebulizer solution  0.25 mg Nebulization BID  ? enoxaparin (LOVENOX) injection  30 mg Subcutaneous Q24H  ? furosemide  80 mg Intravenous BID  ? insulin aspart  0-9 Units Subcutaneous Q4H  ? insulin glargine-yfgn  10 Units Subcutaneous Daily  ? metoprolol tartrate  12.5 mg Oral Daily  ? revefenacin  175  mcg Nebulization Daily  ? sodium chloride flush  3 mL Intravenous Q12H  ? spironolactone  25 mg Oral Daily  ? ?Continuous Infusions: ?PRN Meds:.acetaminophen **OR** acetaminophen, ipratropium-albuterol, ondansetron **OR** ondansetron (ZOFRAN) IV ? ? ?I have personally reviewed following labs and imaging studies ? ?LABORATORY DATA: ?CBC: ?Recent Labs  ?Lab 11/30/2021 ?2247 11/20/2021 ?2318 12/05/21 ?0451 12/06/21 ?3491  ?WBC 7.0  --  6.6 8.6  ?NEUTROABS 5.6  --   --   --   ?HGB 17.5* 18.4* 18.0* 16.4*  ?HCT 52.5* 54.0* 52.9* 49.3*  ?MCV 96.7  --  95.3 95.4  ?PLT 182  --  176 180  ? ? ? ?Basic Metabolic Panel: ?Recent Labs  ?Lab 12/03/2021 ?2247 11/23/2021 ?2318 12/05/21 ?0451 12/06/21 ?7915 12/07/21 ?0130 12/08/21 ?0569  ?NA 136 138 138 134* 132* 132*  ?K 4.5 4.5 4.4 5.1 4.1 4.1  ?CL 101  --  102 102 97* 98  ?CO2 25  --  _0 ?GLUCOSE 179*  --  201* 298* 131* 115*  ?BUN 27*  --  27* 39* 46*  52*  ?CREATININE 1.39*  --  1.33* 1.72* 1.72* 1.58*  ?CALCIUM 9.4  --  9.3 9.0 8.9 8.4*  ?MG  --   --  2.4  --   --   --   ? ? ? ?GFR: ?Estimated Creatinine Clearance: 22.6 mL/min (A) (by C-G formula based on SCr of 1.58 mg/dL (H)). ? ?Liver Function Tests: ?No results for input(s): AST, ALT, ALKPHOS, BILITOT, PROT, ALBUMIN in the last 168 hours. ?No results for input(s): LIPASE, AMYLASE in the last 168 hours. ?No results for input(s): AMMONIA in the last 168 hours. ? ?Coagulation Profile: ?No results for input(s): INR, PROTIME in the last 168 hours. ? ?Cardiac Enzymes: ?No results for input(s): CKTOTAL, CKMB, CKMBINDEX, TROPONINI in the last 168 hours. ? ?BNP (last 3 results) ?No results for input(s): PROBNP in the last 8760 hours. ? ?Lipid Profile: ?No results for input(s): CHOL, HDL, LDLCALC, TRIG, CHOLHDL, LDLDIRECT in the last 72 hours. ? ?Thyroid Function Tests: ?No results for input(s): TSH, T4TOTAL, FREET4, T3FREE, THYROIDAB in the last 72 hours. ? ?Anemia Panel: ?No results for input(s): VITAMINB12, FOLATE, FERRITIN,  TIBC, IRON, RETICCTPCT in the last 72 hours. ? ?Urine analysis: ?No results found for: COLORURINE, APPEARANCEUR, LABSPEC, PHURINE, GLUCOSEU, HGBUR, BILIRUBINUR, KETONESUR, PROTEINUR, UROBILINOGEN, NITRITE, LEUKOCYTE

## 2021-12-08 NOTE — Consult Note (Signed)
? ?Palliative Care Consult Note  ?                                ?Date: 12/08/2021  ? ?Patient Name: [name]  ?DOB: [birth year]  MRN: [id number]  Age / Sex: 88 y.o., female  ?PCP: Anna Contras, MD ?Referring Physician: Jonetta Osgood, MD ? ?Reason for Consultation: Establishing goals of care ? ?HPI/Patient Profile: 88 y.o. female  with past medical history of chronic respiratory failure with hypoxia on 5 L O2 at home, COPD, pulmonary fibrosis, HFpEF (EF 55 to 60%), pulmonary hypertension, and hypertension who presented to the emergency room on 11/30/2021 with shortness of breath.  Chest x-ray showed pulmonary edema superimposed on chronic bibasilar scarring/fibrosis.  She initially required BiPAP.   ?Admitted to hospitalist service with acute on chronic hypoxic respiratory failure due to heart failure exacerbation.  ? ?Past Medical History:  ?Diagnosis Date  ? CHF (congestive heart failure) ([place])   ? COPD (chronic obstructive pulmonary disease) ([place])   ? Diabetes mellitus without complication ([place])   ? Hypertension   ? ? ?Subjective:  ? ?I have reviewed medical records including EPIC notes, labs and imaging, and assessed the patient at bedside.  She is alert, oriented, and sitting up in the recliner. She reports significant shortness of breath and fatigue with any movement. ? ?I initially met with patient alone. I then spoke with her daughter [name] by phone.  I then returned to bedside to meet with patient, daughter [name] and her other daughter [name] (prolonged time).  ? ?I introduced Palliative Medicine as specialized medical care for people living with serious illness. It focuses on providing relief from the symptoms and stress of a serious illness.  ? ?Created space and opportunity for patient and family to explore thoughts and feelings regarding current medical situation. ? ?Values and goals of care important to patient and family were attempted to be  elicited. ? ?Questions and concerns addressed. Patient/family encouraged to call with questions or concerns.   ? ? ?Life Review: ?[name] was born in [place].  She attended [employer] in [place].  She then relocated to [place] where she met her husband.  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  She and her husband relocated to [facility] in 1953 because they thought it was a better place to raise their family.  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  She has always been extremely active, playing tennis into her 57s and then taking up golf. ?She has 3 daughters - [name] and [name].  Her husband passed away in 01-31-2021 ? ?Functional Status: ?[name] is alone in her house in [place].  Since her husband died, her daughters have been rotating to ensure someone is there 24/7.  She is able to ambulate with a walker but this has become increasingly difficult as her illness has progressed. ? ?Additional Discussion: ?We discussed patient's current illness and what it means in the larger context of her ongoing co-morbidities.  Patient and family verbalized that she has multiple medical problems including respiratory failure, COPD, pulmonary fibrosis and heart failure.  Provided education on the natural trajectory of chronic illness, emphasizing that it is non-curable and progressive. Discussed that chronic illness results in decreased functional status over time.  ? ?  The difference between full scope medical intervention and comfort care was considered.  I introduced the concept of a comfort path to patient family, emphasizing that this path involves de-escalating and stopping full scope medical interventions, allowing a natural course to occur. Discussed that the goal is comfort and dignity rather than cure/prolonging life. Encouraged patient and family to  consider at what point they would want to stop full scope medical interventions, keeping in mind the concept of quality of life. Introduced hospice philosophy and provided information on home vs residential hospice services - answered all questions.  ? ?Alianny's biggest concern is that she cannot go home in her current state.  At this point, she cannot do anything without experiencing significant shortness of breath and fatigue.  She does not see the point in trying to keep living like this.  She does not want to be a burden to her daughters. ? ?[name] tells me that she is "ready to go".  She tells me that she would be okay with dying right here right now.  She states that she is at peace and has had a wonderful life.  Her goal is to have a comfortable and peaceful death, and she does not want to be a prolonged process. ? ?Discussed transitioning to comfort care while in the hospital, and what that would look like--keeping her clean and dry, no labs, no artificial hydration or feeding, no antibiotics, minimizing of medications, and medication for pain and dyspnea.  Specifically discussed discontinuing diuretics as well as decreasing her oxygen once we have ensured her symptoms are managed with medication.  ? ?Provided additional education on natural trajectory at end-of-life.  Patient and family greatly desire that she can transfer to a residential hospice facility for end-of-life.  Emotional support was provided.  ? ? ?Review of Systems  ?Constitutional:  Positive for fatigue.  ?Respiratory:  Positive for shortness of breath.   ?Neurological:  Positive for weakness.  ? ?Objective:  ? ?Primary Diagnoses: ?Present on Admission: ? Acute on chronic respiratory failure with hypoxia ([place]) ? Acute on chronic heart failure with preserved ejection fraction (HFpEF) ([place]) ? COPD  GOLD I ? Essential hypertension ? Chronic kidney disease, stage 3b ([place]) ? ? ?Physical Exam ?Vitals reviewed.  ?Constitutional:   ?   Appearance: She is  ill-appearing.  ?Pulmonary:  ?   Effort: Tachypnea and accessory muscle usage present.  ?Neurological:  ?   Mental Status: She is alert and oriented to person, place, and time.  ? ? ?Vital Signs:  ?BP 122/76 (BP Location: Right Arm)   Pulse 60   Temp 97.8 ?F (36.6 ?C) (Oral)   Resp 16   Ht $R'5\' 3"'Zo$  (1.6 m)   Wt 82.1 kg   SpO2 95%   BMI 32.06 kg/m?  ? ?Palliative Assessment/Data: PPS 30% ? ? ? ? ?Assessment & Plan:  ? ?SUMMARY OF RECOMMENDATIONS   ?Full comfort measures initiated ?DNR/DNI as previously documented ?Referral to [facility] consult placed  ?Added orders for symptom management at EOL  ?Discontinued orders that were not focused on comfort ?Tomorrow will begin to wean oxygen as tolerated ?PMT will continue to follow  ? ?Symptom Management:  ?Start dilaudid infusion for comfort ?Lorazepam (ATIVAN) prn for anxiety ?Haloperidol (HALDOL) prn for agitation  ?Glycopyrrolate (ROBINUL) for excessive secretions ?Ondansetron (ZOFRAN) prn for nausea ?Polyvinyl alcohol (LIQUIFILM TEARS) prn for dry eyes ?Antiseptic oral rinse (BIOTENE) prn for dry mouth ? ? ?Primary Decision Maker: ?PATIENT ? ?Prognosis:  ?<  2 weeks ? ?Discharge Planning:  ?To Be Determined  ? ? ?Discussed with: Dr. Sloan Leiter ? ? ?Thank you for allowing [place] to participate in the care of JANELL KEELING ? ? ?Time Total: 90 minutes ? ?Greater than 50%  of this time was spent counseling and coordinating care related to the above assessment and plan. ? ? ?Signed by: ?Elie Confer, NP ?Palliative Medicine Team ? ?Team Phone # [phone]  ?For individual providers, please see AM[facility] ? ? ? ? ? ? ? ? ? ? ? ? ? ? ? ?

## 2021-12-09 ENCOUNTER — Encounter: Payer: Self-pay | Admitting: Pulmonary Disease

## 2021-12-10 ENCOUNTER — Ambulatory Visit: Payer: Medicare PPO | Admitting: Internal Medicine

## 2021-12-10 NOTE — Telephone Encounter (Signed)
I was unable to send a message back to patient's sister to express condolences. Will route to Dr. Vaughan Browner so he is aware.  ?

## 2021-12-16 ENCOUNTER — Encounter: Payer: Self-pay | Admitting: Internal Medicine

## 2021-12-20 NOTE — Progress Notes (Signed)
11ml of Dilaudid wasted with Neta Ehlers, RN.  ?

## 2021-12-20 NOTE — Progress Notes (Signed)
Patient ID:[name]      DOB: 1925-08-26      FXT:[id number] ? ? ? ?  ?Palliative Medicine Team ? ? ? ?Subjective: Bedside symptom check completed. Daughter bedside at time of visit. ? ? ?Physical exam: Patient resting in bed with eyes closed at time of visit. Open mouthed breathing is uneven with long periods of apnea, but non-labored, small amount of secretions noted/heard, nasal cannula applied at 1L/min. Patient without physical or non-verbal signs of pain or discomfort at this time. Patient's extremities cool to touch with very faint radial pulse, unable to palpate pedal pulses. Mottling noted to the feet bilaterally. Patient's skin pale with slight flushing to face. As this RN was visiting, heart rate was observed to drop from 70s to 50, 30, maintaining 25-32. Periods of apnea grew progressively longer while RN bedside.  ? ? ?Assessment and plan: This RN educated the daughter on signs of end of life observed. Daughter shared that she has two other sisters coming today, one from [place] and one locally but later in the day. This RN advised that she call them now to try to get them to the hospital before the patient passes. As the visit continued these signs of EOL rapidly progressed, this RN communicated these changes. This RN stepped briefly out of the patient's room to notify the bedside RN and NT, who both came bedside to be present. Bedside RN without needs at this time, support supplied to the daughter. This RN stepped out of the room to allow private space for the daughter to be with patient. Patient actively dying without signs of pain or distress on dilaudid infusion.  ? ?Thank you for allowing the Palliative Medicine Team to assist in the care of this patient. ?  ?  ?Shelda Jakes, MSN, RN ?Palliative Medicine Team ?Team Phone: [phone]  ?This phone is monitored 7a-7p, please reach out to attending physician outside of these hours for urgent needs.   ?

## 2021-12-20 NOTE — Death Summary Note (Signed)
? ?DEATH SUMMARY  ? ?Patient Details  ?Name: [name] ?MRN: [id number] ?DOB: [birth year] ?[id number], Anna Brow, MD ?Admission/Discharge Information  ? ?Admit Date:  Dec 26, 2021  ?Date of Death: Date of Death: 12-31-2021  ?Time of Death: Time of Death: 9379  ?Length of Stay: 4  ? ?Principle Cause of death: Acute on chronic hypoxic respiratory failure due to pulmonary fibrosis-acute on chronic cor pulmonale with severe pulmonary hypertension. ? ?Hospital Diagnoses: ?Principal Problem: ?  Acute on chronic respiratory failure with hypoxia ([place]) ?Active Problems: ?  Acute on chronic heart failure with preserved ejection fraction (HFpEF) ([place]) ?  COPD  GOLD I ?  Essential hypertension ?  Chronic Powers disease, stage 3b ([place]) ?  Pressure injury of skin ? ? ?Hospital Course: ?[name] is a 88 y.o. female with medical history significant for chronic respiratory failure with hypoxia on 5 L O2 via [place], COPD with pulmonary fibrosis, HFpEF (EF 55-60%), pulmonary hypertension, HTN who presented with shortness of breath-found to have acute on chronic hypoxic respiratory failure due to HFpEF exacerbation.  ?  ?Significant events: ?4/15>> presented to ED with shortness of breath-initially required BiPAP-found to have acute on chronic hypoxic respiratory failure due to probable HFpEF exacerbation. ?4/19>> palliative care consult-transition to full comfort measures. ?  ?Significant studies: ?4/15>> CXR: Pulmonary edema superimposed on chronic bibasilar scarring/fibrosis. ?4/17>> CXR: Increased moderate bilateral pleural effusions, with likely atelectasis.  Mild pulm edema. ?4/17>> ESR: 1 ?4/17>> procalcitonin:<0.10 ?4/17>> A1c:8.5 ?4/17>> Echo: EF 02%, RV systolic function is severely reduced.  RVSP of 73.7. ?  ?Significant microbiology data: ?4/15>> COVID/influenza PCR: Negative ?  ?Procedures: ?None ?  ?Consults: ?None  ? ?Assessment and Plan: ?Acute on chronic hypoxic respiratory failure due to HFpEF exacerbation and severe RV  failure: Had severe hypoxemia-but improved somewhat with IV diuretics-we will continue to have profound dyspnea with minimal activity and significant edema on exam despite of being on IV diuretics.  Patient/family were aware of poor overall prognosis-they did not want any aggressive care including advanced CHF therapies.  Usually on 4 L of oxygen at rest and 5 with ambulation.  Given poor prognosis-advanced age-palliative care evaluation was sought-after extensive discussion with patient/family by the palliative care team-patient was transitioned to full comfort measures and started on a morphine infusion given the degree of severe shortness of breath with minimal activity.  FiO2 was also titrated down to just 1 L of oxygen from [facility]NC.  Patient was unresponsive this morning-and with further comfort measures-she passed away family at bedside. ? ?Note-this MD called patient's daughter [name] and offered my condolences.  She was very appreciated of the care that the patient received during this hospitalization. ? ?history of pulmonary fibrosis-with chronic hypoxic respiratory failure (on 4 L of oxygen at rest-5 L with ambulation)-with acute on chronic corpulmonale/severe pulmonary hypertension : Managed with supportive care and diuretics.COPD: Continue bronchodilators ?  ?AKI CKD stage IIIb: Mild jump in creatinine but subsequently stabilized-she was massively volume overloaded and given IV diuretics. ?  ?HTN: BP was stable. ?  ?DM-2 (A1c 8.5 on 4/17): not a new diagnosis-CBG relatively stable-managed with SSI-not a candidate for aggressive glycemic control. ? ?Palliative care: DNR in place-remains profoundly volume overloaded due to severe right-sided heart failure.  She is not a candidate for aggressive heart failure therapies-nor does she desire any such aggressive care.  Her husband passed away in [place]-and she/daughter is inquiring whether she will qualify.  Palliative care consultation was sought-after  extensive discussion with family/patient by  the palliative care team-patient was transitioned to full comfort measures.  ?  ?Obesity: ?Estimated body mass index is 32.06 kg/m? as calculated from the following: ?  Height as of this encounter: _0  (1.6 m). ?  Weight as of this encounter: 82.1 kg.  ?  ? ?Procedures:  ? ?Consultations: Palliative care ? ?The results of significant diagnostics from this hospitalization (including imaging, microbiology, ancillary and laboratory) are listed below for reference.  ? ?Significant Diagnostic Studies: ?DG Chest Port 1 View ? ?Result Date: 12/08/2021 ?CLINICAL DATA:  Short of breath EXAM: PORTABLE CHEST 1 VIEW COMPARISON:  05/19/2021 FINDINGS: Single frontal view of the chest demonstrates a stable cardiac silhouette. There is continued bibasilar scarring and fibrosis, with superimposed ground-glass airspace disease and small effusions. No pneumothorax. IMPRESSION: 1. Findings consistent with pulmonary edema superimposed upon chronic basilar scarring and fibrosis. 2. Small bilateral pleural effusions. Electronically Signed   By: Randa Ngo M.D.   On: 12/05/2021 22:42  ? ?DG Chest Port 1V same Day ? ?Result Date: 12/06/2021 ?CLINICAL DATA:  Shortness of breath EXAM: PORTABLE CHEST 1 VIEW COMPARISON:  Radiograph 11/27/2021 FINDINGS: Unchanged cardiomegaly. Increased, now moderate bilateral pleural effusions with adjacent bibasilar consolidations. Similar reticulonodular opacities bilaterally. No pneumothorax. No acute osseous abnormality. IMPRESSION: Increased, moderate bilateral pleural effusions with adjacent mid to lower lung consolidations, likely atelectasis. Mild pulmonary edema. Electronically Signed   By: Maurine Simmering M.D.   On: 12/06/2021 08:14  ? ?ECHOCARDIOGRAM COMPLETE ? ?Result Date: 12/06/2021 ?   ECHOCARDIOGRAM REPORT   Patient Name:   [name] Date of Exam: 12/06/2021 Medical Rec #:  [id number]    Height:       63.0 in Accession #:    [id number]   Weight:        179.8 lb Date of Birth:  1925-09-22    BSA:          1.848 m? Patient Age:    10 years     BP:           110/72 mmHg Patient Gender: F            HR:           96 bpm. Exam Location:  Inpatient Procedure: 2D Echo, Cardiac Doppler and Color Doppler Indications:    CHF-Acute Diastolic D63.87  History:        Patient has prior history of Echocardiogram examinations, most                 recent 10/29/2018. CHF, COPD; Risk Factors:Hypertension and                 Diabetes.  Sonographer:    Bernadene Person RDCS Referring Phys: [id number] [place]  1. Left ventricular ejection fraction, by estimation, is 55%. The left ventricle has normal function. The left ventricle has no regional wall motion abnormalities. Left ventricular diastolic parameters are indeterminate.  2. D-shaped septum suggests RV pressure/volume overload. Right ventricular systolic function is severely reduced. The right ventricular size is moderately enlarged. There is severely elevated pulmonary artery systolic pressure. The estimated right ventricular systolic pressure is 51.8 mmHg.  3. Left atrial size was moderately dilated.  4. Right atrial size was severely dilated.  5. The mitral valve is normal in structure. No evidence of mitral valve regurgitation. No evidence of mitral stenosis.  6. The aortic valve is tricuspid. There is mild calcification of the aortic valve. Aortic valve regurgitation is trivial. No aortic stenosis  is present.  7. The inferior vena cava is dilated in size with <50% respiratory variability, suggesting right atrial pressure of 15 mmHg.  8. Pleural effusion noted. FINDINGS  Left Ventricle: Left ventricular ejection fraction, by estimation, is 55%. The left ventricle has normal function. The left ventricle has no regional wall motion abnormalities. The left ventricular internal cavity size was normal in size. There is no left ventricular hypertrophy. Left ventricular diastolic parameters are indeterminate. Right  Ventricle: D-shaped septum suggests RV pressure/volume overload. The right ventricular size is moderately enlarged. No increase in right ventricular wall thickness. Right ventricular systolic function is severely re

## 2021-12-20 NOTE — Care Management Important Message (Deleted)
Important Message ? ?Patient Details  ?Name: [name] ?MRN: [id number] ?Date of Birth: [birth year] ? ? ?Medicare Important Message Given:  Yes ? ? ? ? ?Renie Ora ?2021/12/16, 8:54 AM ?

## 2021-12-20 DEATH — deceased

## 2022-01-04 ENCOUNTER — Encounter (INDEPENDENT_AMBULATORY_CARE_PROVIDER_SITE_OTHER): Payer: Medicare PPO | Admitting: Ophthalmology

## 2022-01-04 ENCOUNTER — Encounter (INDEPENDENT_AMBULATORY_CARE_PROVIDER_SITE_OTHER): Payer: Self-pay

## 2022-01-12 ENCOUNTER — Ambulatory Visit: Payer: Medicare PPO | Admitting: Pulmonary Disease

## 2022-01-20 NOTE — Progress Notes (Signed)
?  Progress Note  ? ?Date: 01/03/2022 ? ?Patient Name: [name]        ?MRN#: [id number] ? ?Review the patient?s clinical findings supports the diagnosis of:  ? ?Hyperglycemia  ? ? ? ? ?

## 2022-02-16 ENCOUNTER — Telehealth: Payer: Medicare PPO | Admitting: Internal Medicine

## 2022-11-16 IMAGING — DX DG CHEST 2V
2 series · 2 of 2 positions shown · non-contrast
Comparison: 10/29/2018

CLINICAL DATA: Pulmonary fibrosis.  Dyspnea on exertion

EXAM:
CHEST - 2 VIEW

[chest pa]
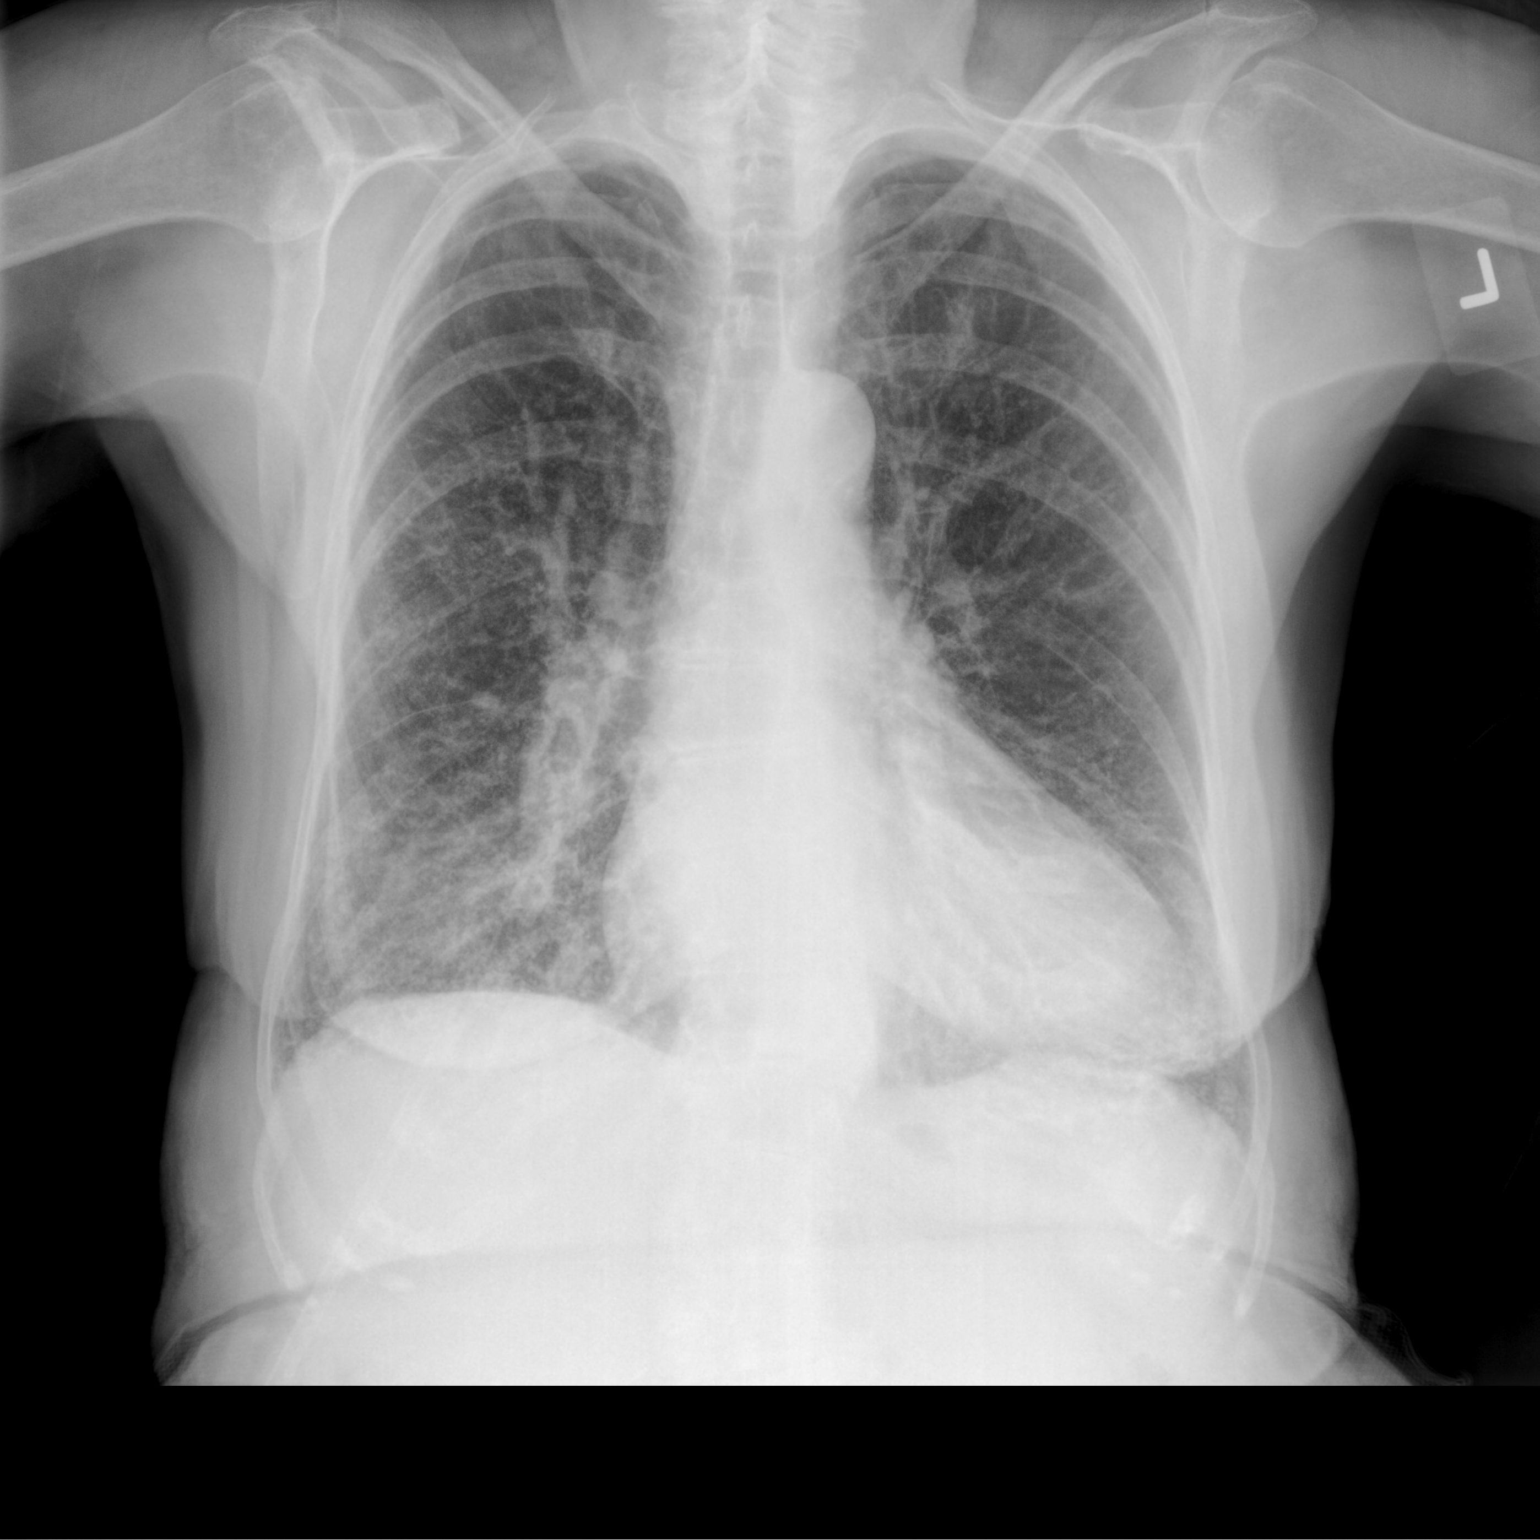

[chest lat]
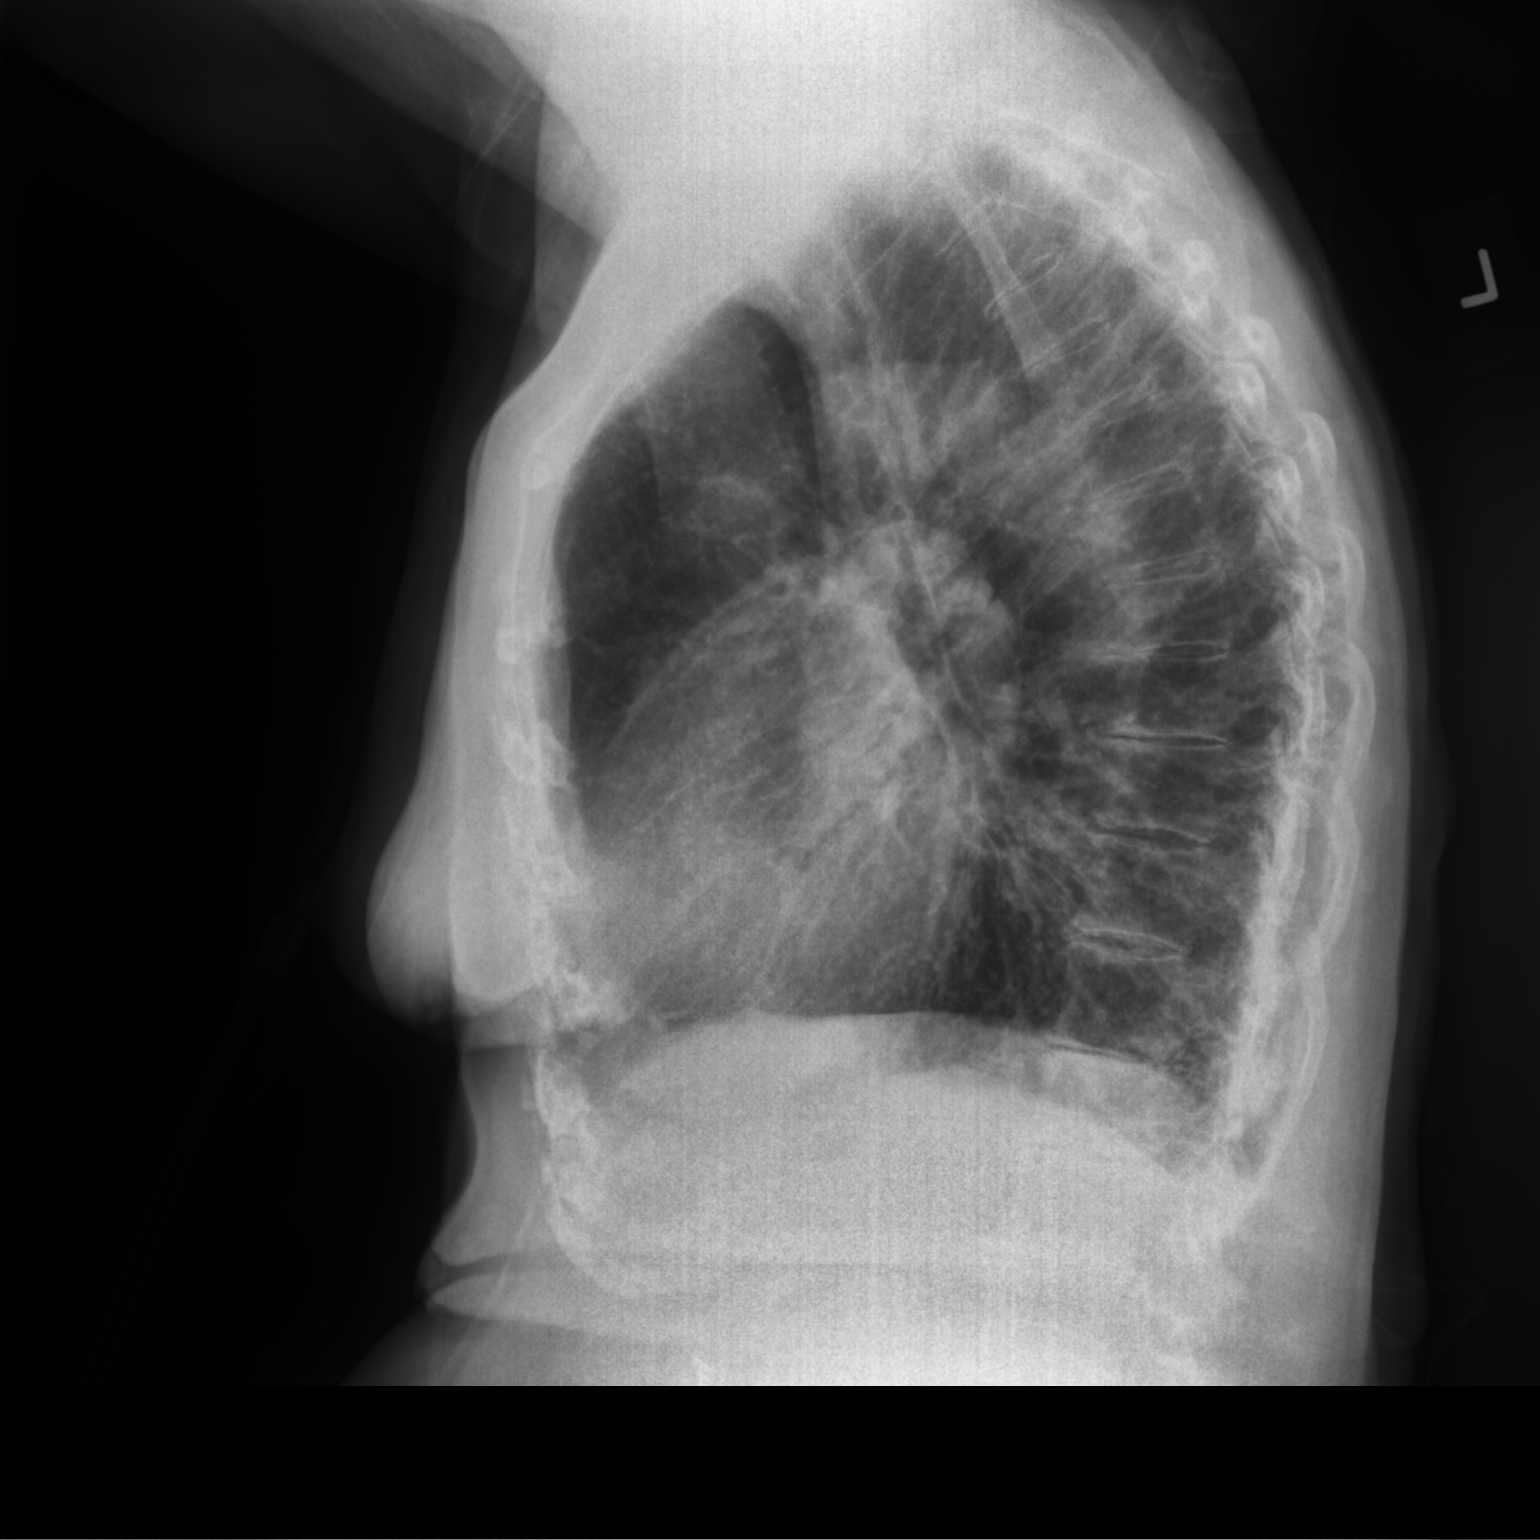

[2 of 2 positions shown; findings below may reference images not displayed]

FINDINGS: Mild cardiac enlargement. Negative for heart failure. Prominent
interstitial lung markings most likely chronic. No acute infiltrate
or effusion. No mass lesion identified.
IMPRESSION: Chronic lung disease with interstitial fibrosis. No superimposed
acute abnormality.

## 2022-12-23 IMAGING — CT CT CHEST HIGH RESOLUTION
1 of 4 series · 14 of 31 positions shown, 18 images · non-contrast
Comparison: 10/27/2018

CLINICAL DATA: Pulmonary fibrosis, former smoker

EXAM:
CT CHEST WITHOUT CONTRAST
TECHNIQUE: Multidetector CT imaging of the chest was performed following the
standard protocol without intravenous contrast. High resolution
imaging of the lungs, as well as inspiratory and expiratory imaging,
was performed.

[Series 2: chest · axial · 0.60mm/px · z∈[+628,+866]mm · 14 of 135 slices shown, 18 images]
[im 8/135  mediastinal]
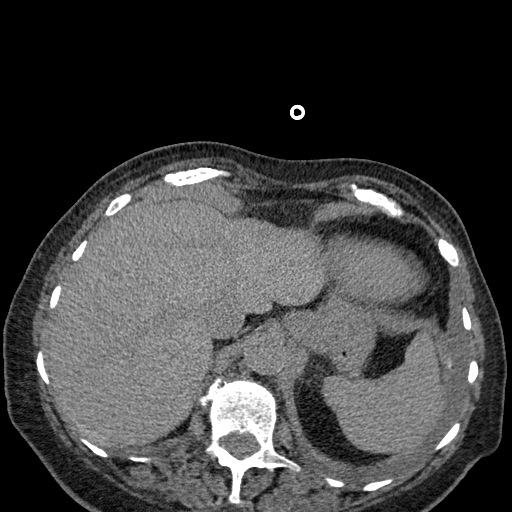
[im 8/135  lung]
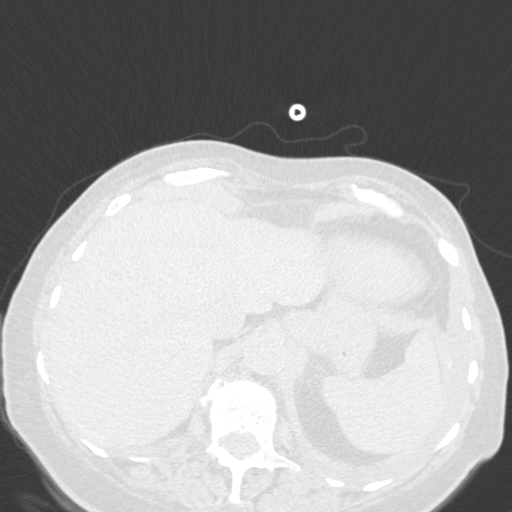
[im 16/135  lung]
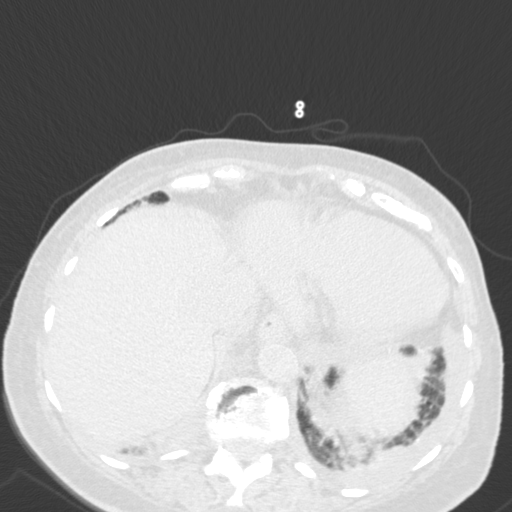
[im 32/135  lung]
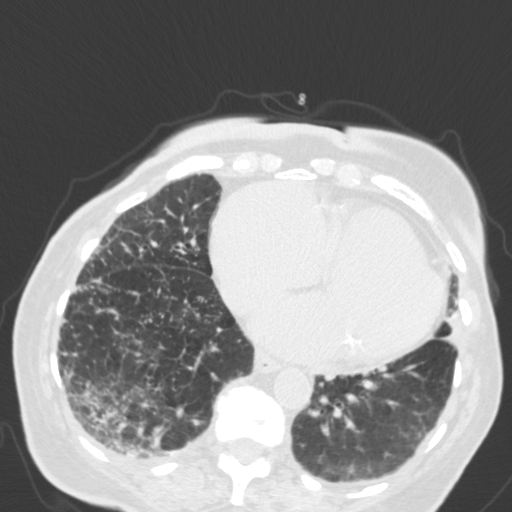
[im 40/135  lung]
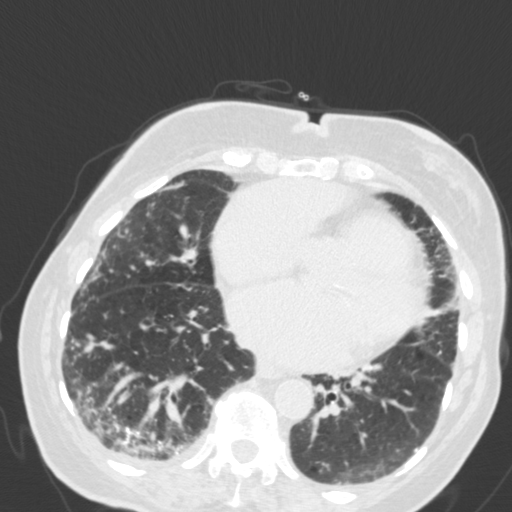
[im 48/135  mediastinal]
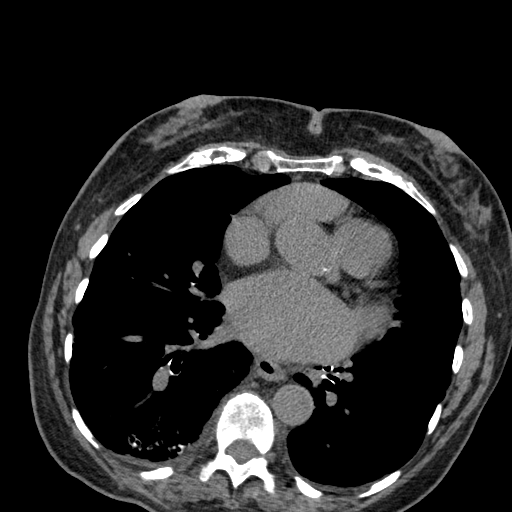
[im 48/135  lung]
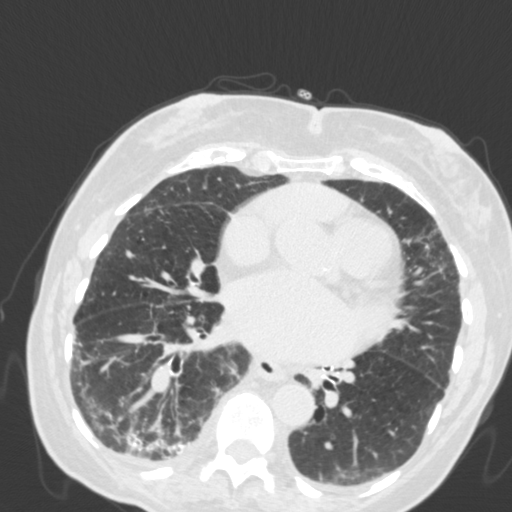
[im 63/135  lung]
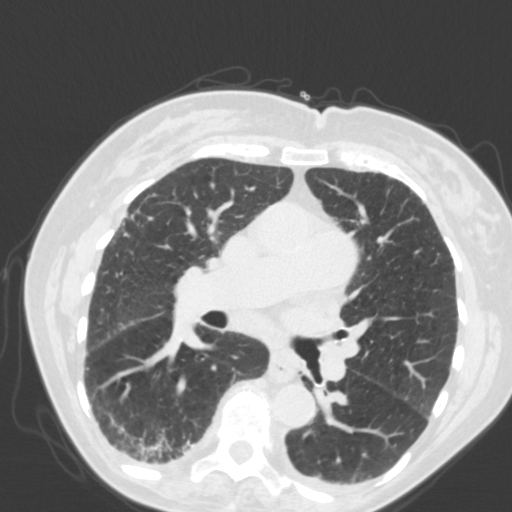
[im 64/135  lung]
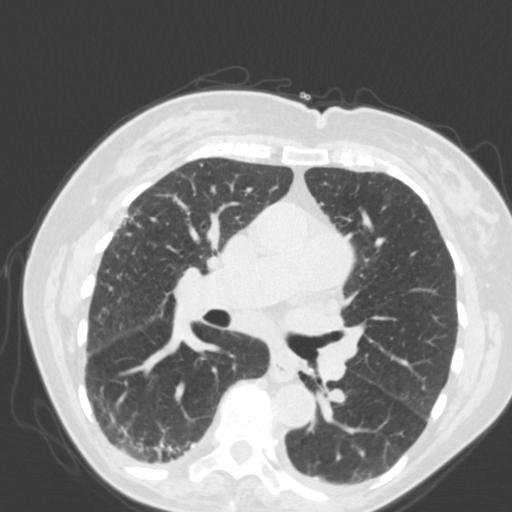
[im 68/135  lung]
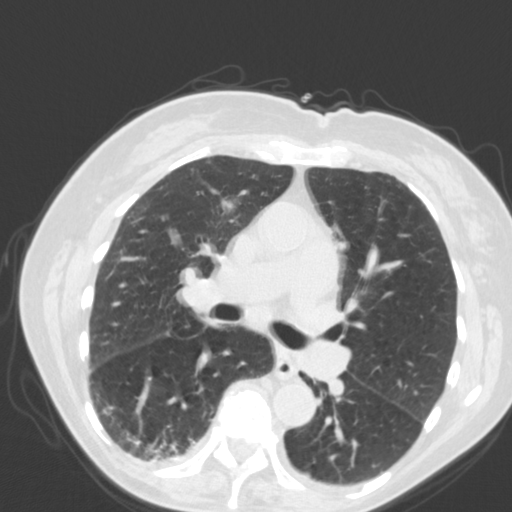
[im 71/135  mediastinal]
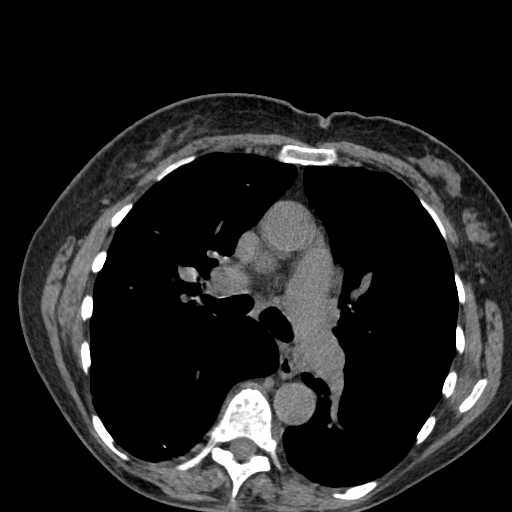
[im 71/135  lung]
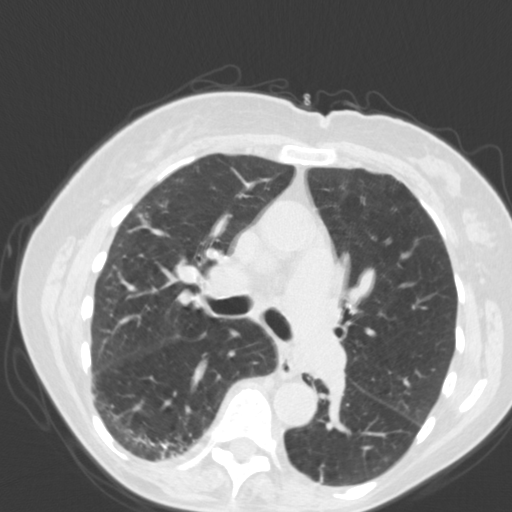
[im 87/135  lung]
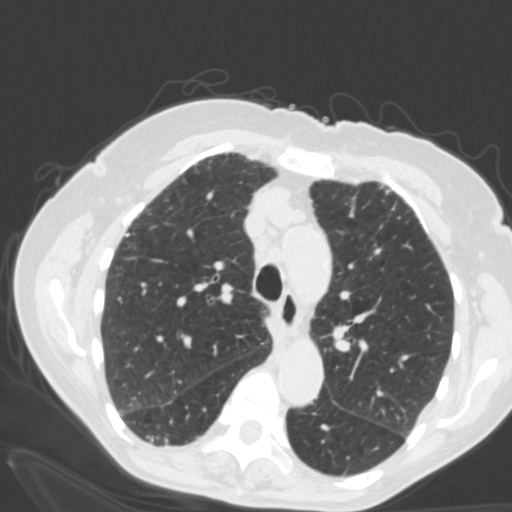
[im 95/135  lung]
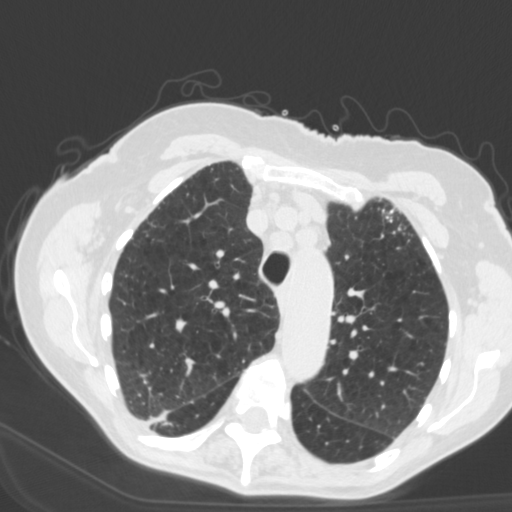
[im 103/135  lung]
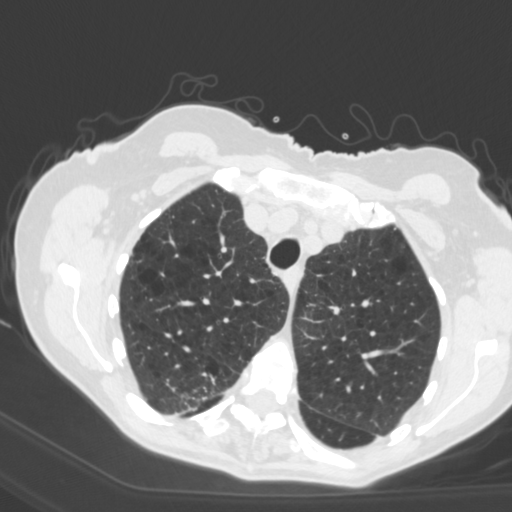
[im 119/135  mediastinal]
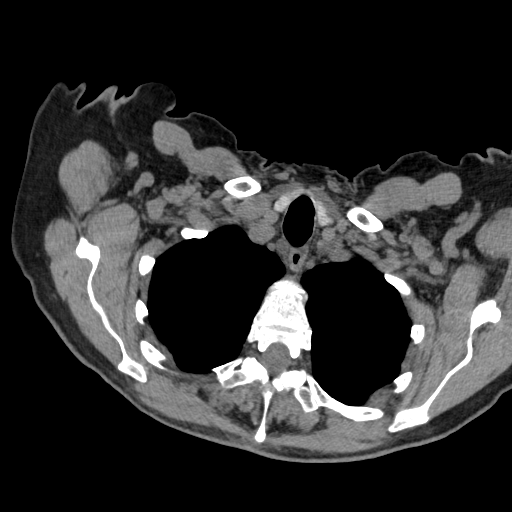
[im 119/135  lung]
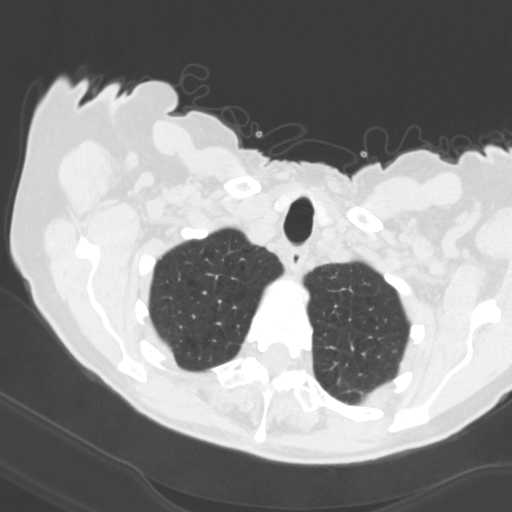
[im 127/135  lung]
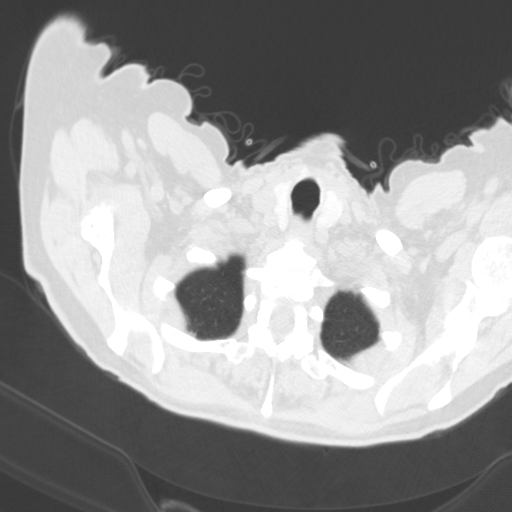

[14 of 31 positions shown; findings below may reference images not displayed]

FINDINGS: Cardiovascular: Scattered aortic atherosclerosis. Cardiomegaly.
Scattered left and right coronary artery calcifications. No
pericardial effusion.

Mediastinum/Nodes: No enlarged mediastinal, hilar, or axillary lymph
nodes. Thyroid gland, trachea, and esophagus demonstrate no
significant findings.

Lungs/Pleura: Moderate centrilobular emphysema. Examination of the
lung bases is very limited by breath motion artifact and the
presence of pleural effusions; within this limitation, there is
similar appearing irregular peripheral interstitial opacity and
septal thickening in a bibasilar predominant distribution with
suggestion of subpleural bronchiolectasis but without clear evidence
of honeycombing. Extensive peripheral dendriform calcification
throughout the lungs, although primarily concentrated in the
dependent lung bases. No significant air trapping on expiratory
phase imaging. Trace bilateral pleural effusions.

Upper Abdomen: No acute abnormality.

Musculoskeletal: No chest wall mass or suspicious bone lesions
identified.
IMPRESSION: 1. Examination of the lung bases is very limited by breath motion
artifact and the presence of pleural effusions; within this
limitation, there is similar appearing irregular peripheral
interstitial opacity and septal thickening in a bibasilar
predominant distribution with suggestion of subpleural
bronchiolectasis but without clear evidence of honeycombing.
Findings remain categorized as probable UIP per consensus
guidelines: Diagnosis of Idiopathic Pulmonary Fibrosis: An Official
ATS/ERS/JRS/ALAT Clinical Practice Guideline. Am J Respir Crit Care
Med Vol 198, [place] 5, [id number][DATE].
2. Trace bilateral pleural effusions.
3. Emphysema.
4. Coronary artery disease.

Aortic Atherosclerosis (RD1FL-5A7.7) and Emphysema ([id number].6).
